# Patient Record
Sex: Male | Born: 1945 | Race: Black or African American | Hispanic: No | Marital: Married | Smoking: Never smoker
Health system: Southern US, Community
[De-identification: ages and names within clinical notes are randomized; demographics above are authoritative.]

## PROBLEM LIST (undated history)

## (undated) DIAGNOSIS — E119 Type 2 diabetes mellitus without complications: Secondary | ICD-10-CM

## (undated) DIAGNOSIS — I10 Essential (primary) hypertension: Secondary | ICD-10-CM

## (undated) DIAGNOSIS — C801 Malignant (primary) neoplasm, unspecified: Secondary | ICD-10-CM

---

## 2001-12-23 ENCOUNTER — Encounter: Payer: Self-pay | Admitting: Cardiology

## 2001-12-23 ENCOUNTER — Ambulatory Visit (HOSPITAL_COMMUNITY): Admission: RE | Admit: 2001-12-23 | Discharge: 2001-12-23 | Payer: Self-pay | Admitting: Cardiology

## 2002-01-26 ENCOUNTER — Ambulatory Visit (HOSPITAL_COMMUNITY): Admission: RE | Admit: 2002-01-26 | Discharge: 2002-01-26 | Payer: Self-pay | Admitting: General Surgery

## 2002-07-16 ENCOUNTER — Encounter: Payer: Self-pay | Admitting: Plastic Surgery

## 2002-07-20 ENCOUNTER — Inpatient Hospital Stay (HOSPITAL_COMMUNITY): Admission: RE | Admit: 2002-07-20 | Discharge: 2002-07-24 | Payer: Self-pay | Admitting: Plastic Surgery

## 2002-07-23 ENCOUNTER — Encounter: Payer: Self-pay | Admitting: Plastic Surgery

## 2012-01-16 ENCOUNTER — Other Ambulatory Visit: Payer: Self-pay

## 2012-01-16 ENCOUNTER — Inpatient Hospital Stay (HOSPITAL_COMMUNITY)
Admission: EM | Admit: 2012-01-16 | Discharge: 2012-01-26 | DRG: 246 | Disposition: A | Discharge status: Home / Self Care | Payer: 59 | Attending: Cardiology | Admitting: Cardiology

## 2012-01-16 ENCOUNTER — Encounter (HOSPITAL_COMMUNITY): Payer: Self-pay | Admitting: Emergency Medicine

## 2012-01-16 ENCOUNTER — Emergency Department (HOSPITAL_COMMUNITY): Payer: 59

## 2012-01-16 DIAGNOSIS — R7989 Other specified abnormal findings of blood chemistry: Secondary | ICD-10-CM | POA: Diagnosis not present

## 2012-01-16 DIAGNOSIS — J449 Chronic obstructive pulmonary disease, unspecified: Secondary | ICD-10-CM | POA: Diagnosis present

## 2012-01-16 DIAGNOSIS — J189 Pneumonia, unspecified organism: Secondary | ICD-10-CM

## 2012-01-16 DIAGNOSIS — Z79899 Other long term (current) drug therapy: Secondary | ICD-10-CM

## 2012-01-16 DIAGNOSIS — F1010 Alcohol abuse, uncomplicated: Secondary | ICD-10-CM | POA: Diagnosis present

## 2012-01-16 DIAGNOSIS — I252 Old myocardial infarction: Secondary | ICD-10-CM

## 2012-01-16 DIAGNOSIS — I2510 Atherosclerotic heart disease of native coronary artery without angina pectoris: Secondary | ICD-10-CM | POA: Diagnosis present

## 2012-01-16 DIAGNOSIS — I471 Supraventricular tachycardia, unspecified: Principal | ICD-10-CM | POA: Diagnosis present

## 2012-01-16 DIAGNOSIS — I129 Hypertensive chronic kidney disease with stage 1 through stage 4 chronic kidney disease, or unspecified chronic kidney disease: Secondary | ICD-10-CM | POA: Diagnosis present

## 2012-01-16 DIAGNOSIS — E8779 Other fluid overload: Secondary | ICD-10-CM | POA: Diagnosis not present

## 2012-01-16 DIAGNOSIS — I509 Heart failure, unspecified: Secondary | ICD-10-CM | POA: Diagnosis present

## 2012-01-16 DIAGNOSIS — I5023 Acute on chronic systolic (congestive) heart failure: Secondary | ICD-10-CM | POA: Diagnosis present

## 2012-01-16 DIAGNOSIS — F17200 Nicotine dependence, unspecified, uncomplicated: Secondary | ICD-10-CM | POA: Diagnosis present

## 2012-01-16 DIAGNOSIS — J9600 Acute respiratory failure, unspecified whether with hypoxia or hypercapnia: Secondary | ICD-10-CM | POA: Diagnosis not present

## 2012-01-16 DIAGNOSIS — I2109 ST elevation (STEMI) myocardial infarction involving other coronary artery of anterior wall: Secondary | ICD-10-CM | POA: Diagnosis present

## 2012-01-16 DIAGNOSIS — I2589 Other forms of chronic ischemic heart disease: Secondary | ICD-10-CM | POA: Diagnosis present

## 2012-01-16 DIAGNOSIS — J690 Pneumonitis due to inhalation of food and vomit: Secondary | ICD-10-CM | POA: Diagnosis not present

## 2012-01-16 DIAGNOSIS — G9340 Encephalopathy, unspecified: Secondary | ICD-10-CM | POA: Diagnosis not present

## 2012-01-16 DIAGNOSIS — N189 Chronic kidney disease, unspecified: Secondary | ICD-10-CM | POA: Diagnosis present

## 2012-01-16 DIAGNOSIS — J4489 Other specified chronic obstructive pulmonary disease: Secondary | ICD-10-CM | POA: Diagnosis present

## 2012-01-16 DIAGNOSIS — N289 Disorder of kidney and ureter, unspecified: Secondary | ICD-10-CM | POA: Diagnosis present

## 2012-01-16 DIAGNOSIS — J9690 Respiratory failure, unspecified, unspecified whether with hypoxia or hypercapnia: Secondary | ICD-10-CM

## 2012-01-16 DIAGNOSIS — Z7982 Long term (current) use of aspirin: Secondary | ICD-10-CM

## 2012-01-16 LAB — CBC
HCT: 34.6 % — ABNORMAL LOW (ref 39.0–52.0)
MCH: 30.4 pg (ref 26.0–34.0)
MCV: 89.9 fL (ref 78.0–100.0)
Platelets: 253 10*3/uL (ref 150–400)
RDW: 15.2 % (ref 11.5–15.5)
WBC: 8.8 10*3/uL (ref 4.0–10.5)

## 2012-01-16 LAB — POCT I-STAT, CHEM 8
Calcium, Ion: 1.15 mmol/L (ref 1.12–1.32)
Chloride: 109 mEq/L (ref 96–112)
Glucose, Bld: 125 mg/dL — ABNORMAL HIGH (ref 70–99)
HCT: 37 % — ABNORMAL LOW (ref 39.0–52.0)
TCO2: 19 mmol/L (ref 0–100)

## 2012-01-16 MED ORDER — ADENOSINE 6 MG/2ML IV SOLN
INTRAVENOUS | Status: AC
  Administered 2012-01-16: 6 mg via INTRAVENOUS
  Filled 2012-01-16: qty 6

## 2012-01-16 MED ORDER — HEPARIN SOD (PORCINE) IN D5W 100 UNIT/ML IV SOLN
1200.0000 [IU]/h | INTRAVENOUS | Status: DC
  Administered 2012-01-16: 1000 [IU]/h via INTRAVENOUS
  Filled 2012-01-16 (×2): qty 250

## 2012-01-16 MED ORDER — DILTIAZEM HCL 50 MG/10ML IV SOLN
20.0000 mg | Freq: Once | INTRAVENOUS | Status: AC
  Administered 2012-01-16: 20 mg via INTRAVENOUS

## 2012-01-16 MED ORDER — HEPARIN SODIUM (PORCINE) 5000 UNIT/ML IJ SOLN
INTRAMUSCULAR | Status: AC
  Administered 2012-01-16: 3000 [IU] via INTRAVENOUS
  Filled 2012-01-16: qty 1

## 2012-01-16 MED ORDER — DEXTROSE 5 % IV SOLN: 60.0000 mg/h | INTRAVENOUS | Status: DC

## 2012-01-16 MED ORDER — VERAPAMIL HCL 2.5 MG/ML IV SOLN
INTRAVENOUS | Status: AC
  Administered 2012-01-16: 5 mg via INTRAVENOUS
  Filled 2012-01-16: qty 2

## 2012-01-16 MED ORDER — METOPROLOL TARTRATE 1 MG/ML IV SOLN
INTRAVENOUS | Status: AC
  Filled 2012-01-16: qty 5

## 2012-01-16 MED ORDER — AMIODARONE HCL IN DEXTROSE 360-4.14 MG/200ML-% IV SOLN: 0.5000 mg/min | INTRAVENOUS | Status: DC

## 2012-01-16 MED ORDER — HEPARIN BOLUS VIA INFUSION: 4000.0000 [IU] | Freq: Once | INTRAVENOUS | Status: DC

## 2012-01-16 MED ORDER — DEXTROSE 5 % IV SOLN: 150.0000 mg | Freq: Once | INTRAVENOUS | Status: DC

## 2012-01-16 MED ORDER — AMIODARONE HCL IN DEXTROSE 360-4.14 MG/200ML-% IV SOLN: 1.0000 mg/min | INTRAVENOUS | Status: DC

## 2012-01-16 MED ORDER — HEPARIN SODIUM (PORCINE) 5000 UNIT/ML IJ SOLN
INTRAMUSCULAR | Status: AC
  Administered 2012-01-16: 5000 [IU] via INTRAVENOUS
  Filled 2012-01-16: qty 1

## 2012-01-16 MED ORDER — METOPROLOL TARTRATE 1 MG/ML IV SOLN
INTRAVENOUS | Status: AC
  Administered 2012-01-16: 2.5 mg
  Filled 2012-01-16: qty 5

## 2012-01-16 MED ORDER — METOPROLOL TARTRATE 1 MG/ML IV SOLN
5.0000 mg | Freq: Once | INTRAVENOUS | Status: AC
  Administered 2012-01-16: 2.5 mg via INTRAVENOUS

## 2012-01-16 MED ORDER — DIGOXIN 0.25 MG/ML IJ SOLN
INTRAMUSCULAR | Status: AC
  Filled 2012-01-16: qty 2

## 2012-01-16 MED ORDER — AMIODARONE LOAD VIA INFUSION
150.0000 mg | Freq: Once | INTRAVENOUS | Status: DC
  Filled 2012-01-16: qty 83.34

## 2012-01-16 NOTE — ED Notes (Signed)
Per Admitting MD administered 2.6 mg Metoprolol

## 2012-01-16 NOTE — ED Notes (Signed)
Per EDP Cardizem 20 mg over 2 minutes adminstered

## 2012-01-16 NOTE — ED Notes (Addendum)
Pt multiple complaints. Cough, weakness, inability to sleep, anorexia, and nausea. PT states his stomach feels like it is filling up with air.

## 2012-01-16 NOTE — ED Notes (Signed)
Adenocard 6mg  given at 2330

## 2012-01-16 NOTE — ED Provider Notes (Signed)
History     CSN: [id number]  Arrival date & time 01/16/12  1904   First MD Initiated Contact with Patient 01/16/12 2118      Chief Complaint  Patient presents with  . Cough    (Consider location/radiation/quality/duration/timing/severity/associated sxs/prior treatment) HPI Complains of generalized weakness cough and some difficulty breathing for approximately the past 4 days accompanied by general malaise. No treatment prior to coming here nothing makes symptoms better or worse. No other associated symptoms no chest pain. History reviewed. No pertinent past medical history. Past medical history negative No past surgical history on file.  No family history on file.  History  Substance Use Topics  . Smoking status: Never Smoker   . Smokeless tobacco: Not on file  . Alcohol Use: Yes      Review of Systems  Constitutional: Positive for fatigue.  HENT: Negative.   Respiratory: Positive for cough and shortness of breath.   Cardiovascular: Negative.   Gastrointestinal: Negative.   Musculoskeletal: Negative.   Skin: Negative.   Neurological: Negative.   Hematological: Negative.   Psychiatric/Behavioral: Negative.     Allergies  Review of patient's allergies indicates no known allergies.  Home Medications   Current Outpatient Rx  Name Route Sig Dispense Refill  . ASPIRIN EC 325 MG PO TBEC Oral Take 325 mg by mouth daily.      BP 127/94  Pulse 168  Temp(Src) 98.5 F (36.9 C) (Oral)  Resp 26  SpO2 96%  Physical Exam  Nursing note and vitals reviewed. Constitutional: He appears well-developed and well-nourished.  HENT:  Head: Normocephalic and atraumatic.  Eyes: Conjunctivae are normal. Pupils are equal, round, and reactive to light.  Neck: Neck supple. No tracheal deviation present. No thyromegaly present.  Cardiovascular:  No murmur heard.      Tachycardic  Pulmonary/Chest: Effort normal and breath sounds normal.  Abdominal: Soft. Bowel sounds are  normal. He exhibits no distension. There is no tenderness.  Musculoskeletal: Normal range of motion. He exhibits no edema and no tenderness.  Neurological: He is alert. Coordination normal.  Skin: Skin is warm and dry. No rash noted.  Psychiatric: He has a normal mood and affect.    ED Course  Procedures (including critical care time)  Date: 01/16/2012  Rate: 170  Rhythm: supraventricular tachycardia (SVT)  QRS Axis: normal  Intervals: normal  ST/T Wave abnormalities: Inferolateral ischemic change  Conduction Disutrbances:none  Narrative Interpretation:   Old EKG Reviewed: changes noted Tracing from 01/22/2002 normal sinus rhythm 65 beats per minute within normal limits Patient received Cardizem 20 mg IV over 2 minutes rhythm converted to normal sinus rhythm Repeat EKG performed at 21:55 PM after Cardizem bolus    Date: 01/16/2012  Rate: 85  Rhythm: normal sinus rhythm  QRS Axis: normal  Intervals: normal  ST/T Wave abnormalities: Inferolateral ischemic T wave changes  Conduction Disutrbances:none   Narrative Interpretation:   Old EKG Reviewed: changes noted Spoke with Dr.Harwani. Will come to the emergency department to evaluate for admission Diagnosis  Labs Reviewed  CBC   No results found.   No diagnosis found.   No results found for this or any previous visit. Dg Chest Port 1 View  01/16/2012  *RADIOLOGY REPORT*  Clinical Data: Severe shortness of breath.  PORTABLE CHEST - 1 VIEW  Comparison: None.  Findings:  The heart is mildly enlarged.  Moderate pulmonary vascular congestion is evident.  No focal airspace disease is evident.  The visualized soft tissues and bony thorax are  unremarkable.  IMPRESSION: Cardiomegaly with moderate pulmonary vascular congestion.  Early congestive heart failure is not excluded.  Original Report Authenticated By: Jamesetta Orleans. MATTERN, M.D.    MDM  Plan cardiacmonitor, r/o acs Diagnosis #1 SVT #2 ischemic EKG changes   CRITICAL  CARE Performed by: Doug Sou   Total critical care time: 30 minute  Critical care time was exclusive of separately billable procedures and treating other patients.  Critical care was necessary to treat or prevent imminent or life-threatening deterioration.  Critical care was time spent personally by me on the following activities: development of treatment plan with patient and/or surrogate as well as nursing, discussions with consultants, evaluation of patient's response to treatment, examination of patient, obtaining history from patient or surrogate, ordering and performing treatments and interventions, ordering and review of laboratory studies, ordering and review of radiographic studies, pulse oximetry and re-evaluation of patient's condition.     Doug Sou, MD 01/16/12 2221

## 2012-01-16 NOTE — Progress Notes (Signed)
ANTICOAGULATION CO NSULT NOTE - Initial Consult  Pharmacy Consult for heparin Indication:  Atrial Tachycardia with ECK changes   No Known Allergies  Patient Measurements: Height: 6' (182.9 cm) Weight: 200 lb (90.719 kg) IBW/kg (Calculated) : 77.6  Heparin Dosing Weight: 82kg   Vital Signs: Temp: 98.5 F (36.9 C) (02/13 1916) Temp src: Oral (02/13 1916) BP: 99/76 mmHg (02/13 2151) Pulse Rate: 92  (02/13 2151)  Labs:  Basename 01/16/12 2230 01/16/12 2159  HGB 12.6* 11.7*  HCT 37.0* 34.6*  PLT -- 253  APTT -- --  LABPROT -- --  INR -- --  HEPARINUNFRC -- --  CREATININE 1.40* --  CKTOTAL -- --  CKMB -- --  TROPONINI -- --   Estimated Creatinine Clearance: 58.5 ml/min (by C-G formula based on Cr of 1.4).    Assessment: Admitted with atrial tachycardia, SOB and ECG changes.  Has been given diltiazem bolus, metoprolol 5mg  iv with litle benefit and ordered amiodarone.  MD wants to start heaprin d/t ECG changes.  Goal of Therapy:  Heparin level 0.3-0.7 units/ml    Plan:  Possible ACS will bolus heparin 4000 uts IV x1 Then heparin drip 1000 uts/hr  Check HL, CBC 6hr after drip started and daily  Marcelino Scot 01/16/2012,10:47 PM

## 2012-01-16 NOTE — H&P (Signed)
Norman Rodgers is an 66 y.o. male.   Chief Complaint: Generalized weakness and shortness of breath HPI: Patient is 66 year old male with no significant past medical history except for tobacco and alcohol abuse he came to the ER complaining of generalized malaise weakness and shortness of breath for last 4 days associated with palpitations. Patient denies any chest pain nausea or vomiting or diaphoresis. Denies history of PND orthopnea or leg swelling. States he drinks approximately one point of hard liquor for last 12 years. Denies any cardiac workup in the recent past. Denies any cough fever chills or flulike symptoms. In ER patient was noted to be in supraventricular tachycardia with heart rate of 170 patient received initially 20 mg of IV Cardizem with conversion to normal sinus rhythm. Her EKG repeat EKG showed the anteroseptal wall myocardial infarction age undetermined and ST T-wave changes in inferolateral leads which were new as compared to prior EKG. Patient had multiple episodes of recurrent paroxysmal SVT requiring initially carotid massage with conversion to sinus rhythm and then IV Lopressor/IV adenocard/IV verapamil with the conversion back into sinus rhythm. Patient denies any chest pain but continues to complain of shortness of breath and was noted in mild congestive heart failure. Presently his labs are pending except as troponin I is minimally elevated his D. dimer start still pending patient did receive a total of 8000 off IV heparin. And will be started on IV heparin drip. We will get spiral CT of chest is D. dimers are elevated to rule out pulmonary embolism.  History reviewed. No pertinent past medical history.  No past surgical history on file.  No family history on file. Social History:  reports that he has never smoked. He does not have any smokeless tobacco history on file. He reports that he drinks alcohol. He reports that he does not use illicit drugs.  Allergies: No Known  Allergies  Medications Prior to Admission  Medication Dose Route Frequency Provider Last Rate Last Dose  . adenosine (ADENOCARD) 6 MG/2ML injection           . amiodarone (NEXTERONE PREMIX) 360 mg/200 mL dextrose IV infusion  1 mg/min Intravenous Continuous Norman Pane, MD       Followed by  . amiodarone (NEXTERONE PREMIX) 360 mg/200 mL dextrose IV infusion  0.5 mg/min Intravenous Continuous Norman Pane, MD      . amiodarone (NEXTERONE) 1.8 mg/mL load via infusion 150 mg  150 mg Intravenous Once Norman Pane, MD      . digoxin (LANOXIN) 0.25 MG/ML injection           . diltiazem (CARDIZEM) injection SOLN 20 mg  20 mg Intravenous Once Norman Sou, MD   20 mg at 01/16/12 2147  . heparin 5000 UNIT/ML injection           . heparin 5000 UNIT/ML injection           . heparin ADULT infusion 100 units/ml (25000 units/250 ml)  1,000 Units/hr Intravenous Continuous Norman Sou, MD      . heparin bolus via infusion 4,000 Units  4,000 Units Intravenous Once Norman Sou, MD      . metoprolol (LOPRESSOR) 1 MG/ML injection        2.5 mg at 01/16/12 2253  . metoprolol (LOPRESSOR) injection 5 mg  5 mg Intravenous Once Norman Pane, MD   2.5 mg at 01/16/12 2241  . metoprolol (LOPRESSOR) injection 5 mg  5 mg Intravenous Once Norman Pane, MD  2.5 mg at 01/16/12 2250  . verapamil (ISOPTIN) 2.5 MG/ML injection           . DISCONTD: amiodarone (CORDARONE) 450 mg in dextrose 5 % 250 mL infusion  150 mg Intravenous Once Norman Pane, MD      . DISCONTD: amiodarone (CORDARONE) 450 mg in dextrose 5 % 250 mL infusion  60 mg/hr Intravenous Continuous Norman Pane, MD       No current outpatient prescriptions on file as of 01/16/2012.    Results for orders placed during the hospital encounter of 01/16/12 (from the past 48 hour(s))  CBC     Status: Abnormal   Collection Time   01/16/12  9:59 PM      Component Value Range Comment   WBC 8.8  4.0 - 10.5 (K/uL)    RBC 3.85 (*) 4.22 - 5.81  (MIL/uL)    Hemoglobin 11.7 (*) 13.0 - 17.0 (g/dL)    HCT 16.1 (*) 09.6 - 52.0 (%)    MCV 89.9  78.0 - 100.0 (fL)    MCH 30.4  26.0 - 34.0 (pg)    MCHC 33.8  30.0 - 36.0 (g/dL)    RDW 04.5  40.9 - 81.1 (%)    Platelets 253  150 - 400 (K/uL)   POCT I-STAT TROPONIN I     Status: Abnormal   Collection Time   01/16/12 10:27 PM      Component Value Range Comment   Troponin i, poc 0.15 (*) 0.00 - 0.08 (ng/mL)    Comment NOTIFIED PHYSICIAN      Comment 3            POCT I-STAT, CHEM 8     Status: Abnormal   Collection Time   01/16/12 10:30 PM      Component Value Range Comment   Sodium 142  135 - 145 (mEq/L)    Potassium 4.7  3.5 - 5.1 (mEq/L)    Chloride 109  96 - 112 (mEq/L)    BUN 29 (*) 6 - 23 (mg/dL)    Creatinine, Ser 9.14 (*) 0.50 - 1.35 (mg/dL)    Glucose, Bld 782 (*) 70 - 99 (mg/dL)    Calcium, Ion 9.56  1.12 - 1.32 (mmol/L)    TCO2 19  0 - 100 (mmol/L)    Hemoglobin 12.6 (*) 13.0 - 17.0 (g/dL)    HCT 21.3 (*) 08.6 - 52.0 (%)    Dg Chest Port 1 View  01/16/2012  *RADIOLOGY REPORT*  Clinical Data: Severe shortness of breath.  PORTABLE CHEST - 1 VIEW  Comparison: None.  Findings:  The heart is mildly enlarged.  Moderate pulmonary vascular congestion is evident.  No focal airspace disease is evident.  The visualized soft tissues and bony thorax are unremarkable.  IMPRESSION: Cardiomegaly with moderate pulmonary vascular congestion.  Early congestive heart failure is not excluded.  Original Report Authenticated By: Norman Rodgers, M.D.    Review of Systems  Constitutional: Negative for fever and chills.  HENT: Negative for neck pain.   Eyes: Negative for blurred vision and double vision.  Respiratory: Positive for shortness of breath. Negative for cough and wheezing.   Cardiovascular: Negative for chest pain, palpitations and orthopnea.  Gastrointestinal: Negative for nausea, vomiting and abdominal pain.  Musculoskeletal: Negative for myalgias.  Skin: Negative for rash.    Neurological: Negative for headaches.    Blood pressure 102/68, pulse 159, temperature 98.5 F (36.9 C), temperature source Oral, resp. rate 26, height 6' (  1.829 m), weight 90.719 kg (200 lb), SpO2 96.00%. Physical Exam  Constitutional: He is oriented to person, place, and time. He appears well-developed and well-nourished.  HENT:  Head: Normocephalic.  Eyes: Conjunctivae are normal. Left eye exhibits no discharge. No scleral icterus.  Neck: Normal range of motion. Neck supple. JVD present. No tracheal deviation present. No thyromegaly present.  Cardiovascular: Normal rate and regular rhythm.        S1-S2 normal there was soft systolic murmur and S3 gallop  Respiratory:       Decreased breath sound at bases with bibasilar Rales.  GI: Soft. Bowel sounds are normal. He exhibits no distension and no mass. There is no tenderness. There is no rebound and no guarding.  Musculoskeletal: He exhibits no edema and no tenderness.  Lymphadenopathy:    He has no cervical adenopathy.  Neurological: He is alert and oriented to person, place, and time.     Assessment/Plan Recurrent paroxysmal supraventricular tachycardia rule out pulmonary embolism rule out MI Probable recent anteroseptal wall myocardial infarction Mild decompensated congestive heart failure multi-factorial i.e. tachycardia induced/probable recent MI  EtOH abuse History of tobacco abuse Plan As per orders   Norman Rodgers 01/16/2012, 11:49 PM

## 2012-01-16 NOTE — ED Notes (Signed)
C/o dry cough, difficulty breathing, decreased appetite, chills, and nausea x 6 days.

## 2012-01-16 NOTE — ED Notes (Signed)
EKG completed at 2103 and given to Dr. Ethelda Chick along with OLD ekg.

## 2012-01-17 ENCOUNTER — Emergency Department (HOSPITAL_COMMUNITY): Payer: 59

## 2012-01-17 ENCOUNTER — Other Ambulatory Visit: Payer: Self-pay

## 2012-01-17 ENCOUNTER — Encounter (HOSPITAL_COMMUNITY): Payer: Self-pay | Admitting: Anesthesiology

## 2012-01-17 ENCOUNTER — Inpatient Hospital Stay (HOSPITAL_COMMUNITY): Payer: 59 | Admitting: Certified Registered Nurse Anesthetist

## 2012-01-17 ENCOUNTER — Encounter (HOSPITAL_COMMUNITY)
Admission: EM | Disposition: A | Discharge status: Home / Self Care | Payer: Self-pay | Source: Home / Self Care | Attending: Cardiology

## 2012-01-17 ENCOUNTER — Encounter (HOSPITAL_COMMUNITY): Payer: Self-pay | Admitting: Radiology

## 2012-01-17 ENCOUNTER — Inpatient Hospital Stay (HOSPITAL_COMMUNITY): Payer: 59

## 2012-01-17 ENCOUNTER — Encounter (HOSPITAL_COMMUNITY): Payer: Self-pay | Admitting: Certified Registered Nurse Anesthetist

## 2012-01-17 DIAGNOSIS — F1010 Alcohol abuse, uncomplicated: Secondary | ICD-10-CM | POA: Diagnosis present

## 2012-01-17 DIAGNOSIS — J189 Pneumonia, unspecified organism: Secondary | ICD-10-CM

## 2012-01-17 DIAGNOSIS — I471 Supraventricular tachycardia: Secondary | ICD-10-CM

## 2012-01-17 DIAGNOSIS — I2510 Atherosclerotic heart disease of native coronary artery without angina pectoris: Secondary | ICD-10-CM

## 2012-01-17 DIAGNOSIS — J9600 Acute respiratory failure, unspecified whether with hypoxia or hypercapnia: Secondary | ICD-10-CM

## 2012-01-17 DIAGNOSIS — J9690 Respiratory failure, unspecified, unspecified whether with hypoxia or hypercapnia: Secondary | ICD-10-CM

## 2012-01-17 DIAGNOSIS — I509 Heart failure, unspecified: Secondary | ICD-10-CM

## 2012-01-17 HISTORY — PX: SUPRAVENTRICULAR TACHYCARDIA ABLATION: SHX5492

## 2012-01-17 LAB — CBC
HCT: 34.2 % — ABNORMAL LOW (ref 39.0–52.0)
HCT: 36.4 % — ABNORMAL LOW (ref 39.0–52.0)
Hemoglobin: 12 g/dL — ABNORMAL LOW (ref 13.0–17.0)
MCH: 30.5 pg (ref 26.0–34.0)
MCHC: 33.3 g/dL (ref 30.0–36.0)
MCHC: 33 g/dL (ref 30.0–36.0)
Platelets: 247 10*3/uL (ref 150–400)
RDW: 15.3 % (ref 11.5–15.5)
RDW: 15.6 % — ABNORMAL HIGH (ref 11.5–15.5)

## 2012-01-17 LAB — LIPID PANEL
Cholesterol: 134 mg/dL (ref 0–200)
HDL: 24 mg/dL — ABNORMAL LOW (ref >39)
Total CHOL/HDL Ratio: 5.6 RATIO
Triglycerides: 68 mg/dL (ref <150)
VLDL: 14 mg/dL (ref 0–40)

## 2012-01-17 LAB — BLOOD GAS, ARTERIAL
Bicarbonate: 19.5 mEq/L — ABNORMAL LOW (ref 20.0–24.0)
MECHVT: 570 mL
PEEP: 5 cmH2O
Patient temperature: 98.6
TCO2: 20.7 mmol/L (ref 0–100)
pCO2 arterial: 38.7 mmHg (ref 35.0–45.0)
pH, Arterial: 7.323 — ABNORMAL LOW (ref 7.350–7.450)

## 2012-01-17 LAB — BASIC METABOLIC PANEL
CO2: 14 mEq/L — ABNORMAL LOW (ref 19–32)
Chloride: 106 mEq/L (ref 96–112)
Potassium: 5.4 mEq/L — ABNORMAL HIGH (ref 3.5–5.1)
Sodium: 141 mEq/L (ref 135–145)

## 2012-01-17 LAB — COMPREHENSIVE METABOLIC PANEL
Albumin: 2.9 g/dL — ABNORMAL LOW (ref 3.5–5.2)
BUN: 29 mg/dL — ABNORMAL HIGH (ref 6–23)
Creatinine, Ser: 1.56 mg/dL — ABNORMAL HIGH (ref 0.50–1.35)
Potassium: 5.2 mEq/L — ABNORMAL HIGH (ref 3.5–5.1)
Total Protein: 7.7 g/dL (ref 6.0–8.3)

## 2012-01-17 LAB — D-DIMER, QUANTITATIVE
D-Dimer, Quant: 1.87 ug/mL-FEU — ABNORMAL HIGH (ref 0.00–0.48)
D-Dimer, Quant: 2.25 ug/mL-FEU — ABNORMAL HIGH (ref 0.00–0.48)

## 2012-01-17 LAB — DIFFERENTIAL
Basophils Absolute: 0 10*3/uL (ref 0.0–0.1)
Basophils Relative: 0 % (ref 0–1)
Eosinophils Relative: 0 % (ref 0–5)
Monocytes Absolute: 0.6 10*3/uL (ref 0.1–1.0)
Neutro Abs: 6.8 10*3/uL (ref 1.7–7.7)

## 2012-01-17 LAB — CARDIAC PANEL(CRET KIN+CKTOT+MB+TROPI)
CK, MB: 6.3 ng/mL (ref 0.3–4.0)
Relative Index: 2.1 (ref 0.0–2.5)
Relative Index: 2.6 — ABNORMAL HIGH (ref 0.0–2.5)
Relative Index: 3.1 — ABNORMAL HIGH (ref 0.0–2.5)
Total CK: 302 U/L — ABNORMAL HIGH (ref 7–232)
Total CK: 309 U/L — ABNORMAL HIGH (ref 7–232)
Troponin I: <0.3 ng/mL (ref <0.30)
Troponin I: <0.3 ng/mL (ref <0.30)

## 2012-01-17 LAB — MRSA PCR SCREENING: MRSA by PCR: NEGATIVE

## 2012-01-17 LAB — APTT: aPTT: 88 seconds — ABNORMAL HIGH (ref 24–37)

## 2012-01-17 LAB — HEPARIN LEVEL (UNFRACTIONATED): Heparin Unfractionated: 0.12 IU/mL — ABNORMAL LOW (ref 0.30–0.70)

## 2012-01-17 LAB — MAGNESIUM: Magnesium: 2.3 mg/dL (ref 1.5–2.5)

## 2012-01-17 LAB — TSH: TSH: 0.532 u[IU]/mL (ref 0.350–4.500)

## 2012-01-17 LAB — PROTIME-INR: Prothrombin Time: 20.6 seconds — ABNORMAL HIGH (ref 11.6–15.2)

## 2012-01-17 SURGERY — SUPRAVENTRICULAR TACHYCARDIA ABLATION
Anesthesia: General

## 2012-01-17 SURGERY — LEFT HEART CATHETERIZATION WITH CORONARY ANGIOGRAM
Anesthesia: LOCAL

## 2012-01-17 MED ORDER — ETOMIDATE 2 MG/ML IV SOLN
INTRAVENOUS | Status: DC | PRN
  Administered 2012-01-17: 8 mg via INTRAVENOUS

## 2012-01-17 MED ORDER — SODIUM CHLORIDE 0.9 % IV SOLN
20.0000 ug/h | INTRAVENOUS | Status: DC
  Filled 2012-01-17: qty 50

## 2012-01-17 MED ORDER — THIAMINE HCL 100 MG/ML IJ SOLN
100.0000 mg | Freq: Every day | INTRAMUSCULAR | Status: DC
  Administered 2012-01-17 – 2012-01-23 (×7): 100 mg via INTRAVENOUS
  Filled 2012-01-17 (×7): qty 1

## 2012-01-17 MED ORDER — AMIODARONE HCL IN DEXTROSE 360-4.14 MG/200ML-% IV SOLN: 30.0000 mg/h | INTRAVENOUS | Status: DC

## 2012-01-17 MED ORDER — DIGOXIN 0.25 MG/ML IJ SOLN
0.2500 mg | Freq: Once | INTRAMUSCULAR | Status: AC
  Administered 2012-01-17: 0.25 mg via INTRAVENOUS
  Filled 2012-01-17: qty 1

## 2012-01-17 MED ORDER — ONDANSETRON HCL 4 MG/2ML IJ SOLN: 4.0000 mg | Freq: Four times a day (QID) | INTRAMUSCULAR | Status: DC | PRN

## 2012-01-17 MED ORDER — SODIUM CHLORIDE 0.9 % IJ SOLN
3.0000 mL | Freq: Two times a day (BID) | INTRAMUSCULAR | Status: DC
  Administered 2012-01-17 (×2): 3 mL via INTRAVENOUS

## 2012-01-17 MED ORDER — SODIUM CHLORIDE 0.9 % IV SOLN
INTRAVENOUS | Status: DC
  Administered 2012-01-17 – 2012-01-20 (×3): via INTRAVENOUS

## 2012-01-17 MED ORDER — SODIUM CHLORIDE 0.9 % IV SOLN
INTRAVENOUS | Status: DC
  Administered 2012-01-17: 02:00:00 via INTRAVENOUS

## 2012-01-17 MED ORDER — SODIUM CHLORIDE 0.9 % IV SOLN: 250.0000 mL | INTRAVENOUS | Status: DC | PRN

## 2012-01-17 MED ORDER — AMIODARONE HCL IN DEXTROSE 360-4.14 MG/200ML-% IV SOLN
0.5000 mg/min | INTRAVENOUS | Status: DC
  Filled 2012-01-17: qty 200

## 2012-01-17 MED ORDER — DIAZEPAM 5 MG PO TABS: 5.0000 mg | ORAL_TABLET | ORAL | Status: DC

## 2012-01-17 MED ORDER — FENTANYL CITRATE 0.05 MG/ML IJ SOLN
INTRAMUSCULAR | Status: AC
  Filled 2012-01-17: qty 2

## 2012-01-17 MED ORDER — FUROSEMIDE 10 MG/ML IJ SOLN
20.0000 mg | Freq: Once | INTRAMUSCULAR | Status: AC
  Administered 2012-01-17: 20 mg via INTRAVENOUS
  Filled 2012-01-17: qty 2

## 2012-01-17 MED ORDER — ROSUVASTATIN CALCIUM 20 MG PO TABS
20.0000 mg | ORAL_TABLET | Freq: Every day | ORAL | Status: DC
  Administered 2012-01-17: 20 mg via ORAL
  Filled 2012-01-17: qty 1

## 2012-01-17 MED ORDER — ASPIRIN 81 MG PO CHEW
324.0000 mg | CHEWABLE_TABLET | ORAL | Status: AC
  Administered 2012-01-17: 324 mg via ORAL
  Filled 2012-01-17: qty 4

## 2012-01-17 MED ORDER — FENTANYL CITRATE 0.05 MG/ML IJ SOLN
INTRAMUSCULAR | Status: AC
  Administered 2012-01-17: 100 ug
  Filled 2012-01-17: qty 2

## 2012-01-17 MED ORDER — AMIODARONE HCL IN DEXTROSE 360-4.14 MG/200ML-% IV SOLN
30.0000 mg/h | INTRAVENOUS | Status: DC
  Administered 2012-01-17: 60 mg/h via INTRAVENOUS
  Administered 2012-01-18: 30 mg/h via INTRAVENOUS
  Administered 2012-01-18 (×2): 60 mg/h via INTRAVENOUS
  Administered 2012-01-18 – 2012-01-20 (×4): 30 mg/h via INTRAVENOUS
  Filled 2012-01-17 (×18): qty 200

## 2012-01-17 MED ORDER — MIDAZOLAM HCL 5 MG/5ML IJ SOLN
INTRAMUSCULAR | Status: AC
  Filled 2012-01-17: qty 5

## 2012-01-17 MED ORDER — SODIUM CHLORIDE 0.9 % IV SOLN
INTRAVENOUS | Status: DC
  Administered 2012-01-17 – 2012-01-19 (×2): via INTRAVENOUS

## 2012-01-17 MED ORDER — AMIODARONE HCL IN DEXTROSE 360-4.14 MG/200ML-% IV SOLN: 0.5000 mg/min | INTRAVENOUS | Status: DC

## 2012-01-17 MED ORDER — PANTOPRAZOLE SODIUM 40 MG IV SOLR
40.0000 mg | Freq: Every day | INTRAVENOUS | Status: DC
  Administered 2012-01-18 – 2012-01-23 (×6): 40 mg via INTRAVENOUS
  Filled 2012-01-17 (×6): qty 40

## 2012-01-17 MED ORDER — PANTOPRAZOLE SODIUM 40 MG PO TBEC
40.0000 mg | DELAYED_RELEASE_TABLET | Freq: Every day | ORAL | Status: DC
  Administered 2012-01-17: 40 mg via ORAL
  Filled 2012-01-17: qty 1

## 2012-01-17 MED ORDER — DEXTROSE 5 % IV SOLN
500.0000 mg | INTRAVENOUS | Status: AC
  Administered 2012-01-17 – 2012-01-22 (×6): 500 mg via INTRAVENOUS
  Filled 2012-01-17 (×8): qty 500

## 2012-01-17 MED ORDER — BUPIVACAINE HCL (PF) 0.25 % IJ SOLN
INTRAMUSCULAR | Status: AC
  Filled 2012-01-17: qty 30

## 2012-01-17 MED ORDER — CHLORHEXIDINE GLUCONATE 0.12 % MT SOLN
15.0000 mL | Freq: Two times a day (BID) | OROMUCOSAL | Status: DC
  Administered 2012-01-18 – 2012-01-19 (×3): 15 mL via OROMUCOSAL
  Filled 2012-01-17 (×3): qty 15

## 2012-01-17 MED ORDER — ASPIRIN 300 MG RE SUPP: 300.0000 mg | RECTAL | Status: AC

## 2012-01-17 MED ORDER — SODIUM CHLORIDE 0.9 % IV SOLN
INTRAVENOUS | Status: DC | PRN
  Administered 2012-01-17: 19:00:00 via INTRAVENOUS

## 2012-01-17 MED ORDER — SODIUM CHLORIDE 0.9 % IV SOLN
2.0000 mg/h | INTRAVENOUS | Status: DC
  Filled 2012-01-17: qty 10

## 2012-01-17 MED ORDER — DIGOXIN 0.25 MG/ML IJ SOLN: 0.2500 mg | Freq: Once | INTRAMUSCULAR | Status: DC

## 2012-01-17 MED ORDER — METOPROLOL TARTRATE 25 MG PO TABS
25.0000 mg | ORAL_TABLET | Freq: Two times a day (BID) | ORAL | Status: DC
  Administered 2012-01-17 – 2012-01-18 (×4): 25 mg via ORAL
  Filled 2012-01-17 (×6): qty 1

## 2012-01-17 MED ORDER — AMIODARONE LOAD VIA INFUSION
150.0000 mg | Freq: Once | INTRAVENOUS | Status: DC
  Administered 2012-01-17: 150 mg via INTRAVENOUS
  Filled 2012-01-17: qty 83.34

## 2012-01-17 MED ORDER — ACETAMINOPHEN 325 MG PO TABS
650.0000 mg | ORAL_TABLET | ORAL | Status: DC | PRN
  Filled 2012-01-17: qty 1

## 2012-01-17 MED ORDER — DIGOXIN 0.25 MG/ML IJ SOLN
0.2500 mg | Freq: Every day | INTRAMUSCULAR | Status: DC
  Administered 2012-01-17: 0.25 mg via INTRAVENOUS
  Filled 2012-01-17 (×2): qty 1

## 2012-01-17 MED ORDER — SODIUM BICARBONATE 8.4 % IV SOLN
INTRAVENOUS | Status: DC
  Filled 2012-01-17: qty 1000

## 2012-01-17 MED ORDER — AMIODARONE HCL IN DEXTROSE 360-4.14 MG/200ML-% IV SOLN
INTRAVENOUS | Status: AC
  Administered 2012-01-17: 1 mg/min via INTRAVENOUS
  Filled 2012-01-17: qty 200

## 2012-01-17 MED ORDER — ADENOSINE 6 MG/2ML IV SOLN
INTRAVENOUS | Status: AC
  Administered 2012-01-17: 12 mg via INTRAVENOUS
  Filled 2012-01-17: qty 4

## 2012-01-17 MED ORDER — NITROGLYCERIN 0.4 MG SL SUBL: 0.4000 mg | SUBLINGUAL_TABLET | SUBLINGUAL | Status: DC | PRN

## 2012-01-17 MED ORDER — CHLORHEXIDINE GLUCONATE 0.12 % MT SOLN
OROMUCOSAL | Status: AC
  Administered 2012-01-17: 15 mL
  Filled 2012-01-17: qty 15

## 2012-01-17 MED ORDER — ACETAMINOPHEN 325 MG PO TABS: 650.0000 mg | ORAL_TABLET | ORAL | Status: DC | PRN

## 2012-01-17 MED ORDER — FUROSEMIDE 10 MG/ML IJ SOLN
80.0000 mg | Freq: Four times a day (QID) | INTRAMUSCULAR | Status: AC
  Administered 2012-01-18 (×2): 80 mg via INTRAVENOUS
  Filled 2012-01-17 (×3): qty 8

## 2012-01-17 MED ORDER — SODIUM CHLORIDE 0.9 % IV SOLN
INTRAVENOUS | Status: DC
  Administered 2012-01-18 – 2012-01-19 (×2): via INTRAVENOUS

## 2012-01-17 MED ORDER — ADENOSINE 12 MG/4ML IV SOLN
12.0000 mg | Freq: Once | INTRAVENOUS | Status: AC
  Administered 2012-01-17: 12 mg via INTRAVENOUS
  Filled 2012-01-17: qty 4

## 2012-01-17 MED ORDER — FUROSEMIDE 10 MG/ML IJ SOLN
80.0000 mg | Freq: Once | INTRAMUSCULAR | Status: AC
  Administered 2012-01-17: 80 mg via INTRAVENOUS

## 2012-01-17 MED ORDER — IOHEXOL 350 MG/ML SOLN
100.0000 mL | Freq: Once | INTRAVENOUS | Status: AC | PRN
  Administered 2012-01-17: 100 mL via INTRAVENOUS

## 2012-01-17 MED ORDER — MIDAZOLAM HCL 2 MG/2ML IJ SOLN
4.0000 mg | Freq: Once | INTRAMUSCULAR | Status: AC
  Administered 2012-01-17: 4 mg via INTRAVENOUS

## 2012-01-17 MED ORDER — DEXTROSE 5 % IV SOLN
1.0000 g | INTRAVENOUS | Status: DC
  Administered 2012-01-17 – 2012-01-25 (×9): 1 g via INTRAVENOUS
  Filled 2012-01-17 (×11): qty 10

## 2012-01-17 MED ORDER — VANCOMYCIN HCL 1000 MG IV SOLR
2000.0000 mg | Freq: Once | INTRAVENOUS | Status: DC
  Administered 2012-01-17: 2000 mg via INTRAVENOUS
  Filled 2012-01-17: qty 2000

## 2012-01-17 MED ORDER — ASPIRIN 81 MG PO CHEW
324.0000 mg | CHEWABLE_TABLET | ORAL | Status: AC
  Administered 2012-01-17: 324 mg via ORAL
  Filled 2012-01-17: qty 3
  Filled 2012-01-17: qty 1

## 2012-01-17 MED ORDER — DEXTROSE 5 % IV SOLN: 1.0000 mg/min | INTRAVENOUS | Status: DC

## 2012-01-17 MED ORDER — MIDAZOLAM HCL 2 MG/2ML IJ SOLN
2.0000 mg | INTRAMUSCULAR | Status: DC | PRN
  Administered 2012-01-17: 2 mg via INTRAVENOUS
  Filled 2012-01-17: qty 2

## 2012-01-17 MED ORDER — MIDAZOLAM HCL 2 MG/2ML IJ SOLN
INTRAMUSCULAR | Status: AC
  Filled 2012-01-17: qty 2

## 2012-01-17 MED ORDER — FUROSEMIDE 10 MG/ML IJ SOLN
INTRAMUSCULAR | Status: AC
  Filled 2012-01-17: qty 8

## 2012-01-17 MED ORDER — SODIUM CHLORIDE 0.9 % IV SOLN
50.0000 ug/h | INTRAVENOUS | Status: DC
  Administered 2012-01-17: 75 ug/h via INTRAVENOUS
  Filled 2012-01-17 (×2): qty 50

## 2012-01-17 MED ORDER — SODIUM CHLORIDE 0.9 % IJ SOLN: 3.0000 mL | INTRAMUSCULAR | Status: DC | PRN

## 2012-01-17 MED ORDER — ADULT MULTIVITAMIN LIQUID CH
5.0000 mL | Freq: Every day | ORAL | Status: DC
  Administered 2012-01-17 – 2012-01-23 (×7): 5 mL via ORAL
  Filled 2012-01-17 (×7): qty 5

## 2012-01-17 MED ORDER — SODIUM CHLORIDE 0.9 % IV SOLN
1.0000 mg/h | INTRAVENOUS | Status: DC
  Administered 2012-01-17: 2 mg/h via INTRAVENOUS
  Filled 2012-01-17 (×2): qty 10

## 2012-01-17 MED ORDER — SODIUM BICARBONATE 8.4 % IV SOLN
INTRAVENOUS | Status: DC
  Filled 2012-01-17: qty 500

## 2012-01-17 MED ORDER — VERAPAMIL HCL 2.5 MG/ML IV SOLN
2.5000 mg | Freq: Once | INTRAVENOUS | Status: DC
  Filled 2012-01-17: qty 1

## 2012-01-17 MED ORDER — FENTANYL BOLUS VIA INFUSION
50.0000 ug | Freq: Four times a day (QID) | INTRAVENOUS | Status: DC | PRN
  Filled 2012-01-17: qty 100

## 2012-01-17 MED ORDER — ASPIRIN EC 81 MG PO TBEC
81.0000 mg | DELAYED_RELEASE_TABLET | Freq: Every day | ORAL | Status: DC
  Administered 2012-01-18 – 2012-01-21 (×4): 81 mg via ORAL
  Filled 2012-01-17 (×6): qty 1

## 2012-01-17 MED ORDER — SUCCINYLCHOLINE CHLORIDE 20 MG/ML IJ SOLN
INTRAMUSCULAR | Status: DC | PRN
  Administered 2012-01-17 (×2): 100 mg via INTRAVENOUS

## 2012-01-17 MED ORDER — FOLIC ACID 5 MG/ML IJ SOLN
1.0000 mg | Freq: Every day | INTRAMUSCULAR | Status: DC
  Administered 2012-01-17 – 2012-01-23 (×7): 1 mg via INTRAVENOUS
  Filled 2012-01-17 (×9): qty 0.2

## 2012-01-17 MED ORDER — AMIODARONE HCL IN DEXTROSE 360-4.14 MG/200ML-% IV SOLN
1.0000 mg/min | INTRAVENOUS | Status: DC
  Administered 2012-01-17: 1 mg/min via INTRAVENOUS
  Administered 2012-01-17: 0.5 mg/min via INTRAVENOUS
  Filled 2012-01-17: qty 200

## 2012-01-17 MED ORDER — SODIUM BICARBONATE 8.4 % IV SOLN: INTRAVENOUS | Status: DC

## 2012-01-17 NOTE — Progress Notes (Signed)
Dr. Sharyn Lull updated on Pt's condition, shortness of breath and labored breathing.  Dr. Sharyn Lull to come and see pt.

## 2012-01-17 NOTE — Op Note (Signed)
EPS/RFA of atrial tachycardia carried out via the left femoral vein. [id number].

## 2012-01-17 NOTE — Progress Notes (Signed)
Patient remains somnolent after ablation and needs to be maintained still while femoral sheaths are removed. His oxygen saturation is 95%. I have recommended we electively intubate/sedate to protect him from groin bleeding and airway protection. Lewayne Bunting, M.D.

## 2012-01-17 NOTE — Progress Notes (Signed)
CRITICAL VALUE ALERT  Critical value received:  Trop. 0.32   Date of notification:  01/17/12   Time of notification:  0900am  Critical value read back:yes  Nurse who received alert:  Dayna Barker, RN  MD notified (1st page):  Dr. Sharyn Lull  Time of first page:  0955am  MD notified (2nd page):  Time of second page:  Responding MD:  Dr. Sharyn Lull  Time MD responded:  [phone]

## 2012-01-17 NOTE — Progress Notes (Signed)
Pt HR in 150's. Pt having shortness of breath and nausea.  Stat 12 lead ecg obtained. Dr. Sharyn Lull notified, orders received for Amiodarone.  Dr. Ladona Ridgel came to bedside and ordered adenosine 12mg .  This was given with Dr. Ladona Ridgel and Sharyn Lull at bedside.  No change in rhythm with adenosine.  Amiodarone 150mg  bolus and 60mg /hr drip started and received orders from Dr. Sharyn Lull for IV digoxin.  Will continue to monitor.

## 2012-01-17 NOTE — Consult Note (Signed)
Name: FLORENCE ANTONELLI MRN: [id number] DOB: 1946/08/26  LOS: 1  CRITICAL CARE ADMISSION NOTE  History of Present Illness: 66 y/o male with no past medical history presented to [facility] ED on 2/13 with several days of dyspnea and malaise.  He was found to be in SVT and likely volume overload and was admitted to cardiology for further management.  He underwent an RFA today for recurrent SVT atrial tachycardia and was somnolent post procedure so was electively intubated.  PCCM consulted for further management.  Lines / Drains: 2/14 ETT >>  Cultures / Sepsis markers: 2/14 blood >> 2/14 sputum >> 2/14 urine st/leg ag >>  Antibiotics: 2/14 Ceft (pneumonia?) >> 2/14 azithro (pneumonia?) >> 2/14 vanc (pneumonia?) >> 2/14  Tests / Events: 2/14 EP study, ablation for SVT  2/14 TTE: global hypokinesis of LV, LVEF 20% 2/14 CT angio     History reviewed. No pertinent past medical history. History reviewed. No pertinent past surgical history. Prior to Admission medications   Medication Sig Start Date End Date Taking? Authorizing Provider  aspirin EC 325 MG tablet Take 325 mg by mouth daily.   Yes Historical Provider, MD   No Known Allergies History reviewed. No pertinent family history. Social History  reports that he has never smoked. He does not have any smokeless tobacco history on file. He reports that he drinks alcohol. He reports that he does not use illicit drugs.  Review Of Systems   Cannot obtain due to intubation  Vital Signs:   Filed Vitals:   01/17/12 1300 01/17/12 1400 01/17/12 1812 01/17/12 1918  BP: 102/81 97/69  121/86  Pulse: 134 138 158 74  Temp:  97.3 F (36.3 C)    TempSrc:  Oral    Resp: 38 38  27  Height:      Weight:      SpO2: 97% 98%  98%    Physical Examination: Gen: sedated on vent HEENT: NCAT, PERRL, EOMi, OP clear, ETT in place Neck: supple without masses PULM: Insp crackles noted, rhonchi bilat noted CV: RRR, cannot appreciate S3, elevated JVP  noted AB: BS+, soft, nontender, no hsm Ext: warm, no edema, no clubbing, no cyanosis Derm: no rash or skin breakdown Neuro: sedated on vent, follows commands intermittently and maew Psyche: cannot assess  Labs and Imaging:    CBC    Component Value Date/Time   WBC 12.8* 01/17/2012 0759   RBC 3.93* 01/17/2012 0759   HGB 12.0* 01/17/2012 0759   HCT 36.4* 01/17/2012 0759   PLT 247 01/17/2012 0759   MCV 92.6 01/17/2012 0759   MCH 30.5 01/17/2012 0759   MCHC 33.0 01/17/2012 0759   RDW 15.6* 01/17/2012 0759   LYMPHSABS 2.2 01/17/2012 0115   MONOABS 0.6 01/17/2012 0115   EOSABS 0.0 01/17/2012 0115   BASOSABS 0.0 01/17/2012 0115    BMET    Component Value Date/Time   NA 141 01/17/2012 0759   K 5.4* 01/17/2012 0759   CL 106 01/17/2012 0759   CO2 14* 01/17/2012 0759   GLUCOSE 98 01/17/2012 0759   BUN 36* 01/17/2012 0759   CREATININE 1.64* 01/17/2012 0759   CALCIUM 9.5 01/17/2012 0759   GFRNONAA 43* 01/17/2012 0759   GFRAA 49* 01/17/2012 0759     Assessment and Plan:  This is a 66 y/o male with no past medical history but a significant alcohol use history presented on 2/13 with respiratory distress and SVT.  Intubated for respiratory insufficiency after EP study/ablation today.  Appears to  be grossly volume overloaded.  Ddx of resp failure includes pneumonia however his wife reports no sputum production or fever prior to admission and he has not had fever here so I doubt that he has pneumonia.  Respiratory failure (01/17/2012)   Assessment: due to volume overload   Plan:  -I have added two more doses of lasix tonight for continued diuresis -full vent support -ABG now and in AM -CXR in AM -wua/sbt in AM, hopefully will extubate in AM  SVT (supraventricular tachycardia) (01/16/2012)   Assessment: s/p ablation today, related to EtOH abuse?   Plan:  -per cardiology  CHF (congestive heart failure) (01/17/2012)   Assessment: due to alcohol abuse vs. SVT? still volume up 2/14   Plan:  -I will  diurese further -remainder of care/plan per cardiology  CAD (coronary artery disease) (01/17/2012)   Assessment: question of old ant MI   Plan:  -per cardiology  Pneumonia (01/17/2012)   Assessment: I do not think that he has pneumonia as he has no fever or sputum production, I think the infiltrates likely represent pulm edema   Plan:  -I have stopped vanc -continue ceftriaxone and azithro for now -f/u cultures/biomarkers -if afebrile, no secretions, would stop antibiotics on 2/15  Alcohol abuse (01/17/2012)   Assessment:    Plan:  -I have started daily thiamine/folate/mvi supplementation    Best practices / Disposition: PCCM consulting, Dr. Sharyn Lull primary physician  Feeding/protein malnutrition:  Analgesia: fentanyl gtt Sedation: versed prn Thromboprophylaxis: scd HOB >30 degrees Ulcer prophylaxis: ppi  Glucose control/hyperglycemia: monitor cbg  The patient is critically ill with multiple organ systems failure and requires high complexity decision making for assessment and support, frequent evaluation and titration of therapies, application of advanced monitoring technologies and extensive interpretation of multiple databases. Critical Care Time devoted to patient care services described in this note is  45 minutes.  Heber Canute, M.D. Pulmonary and Critical Care Medicine [facility] Pager: [phone]  01/17/2012, 7:44 PM

## 2012-01-17 NOTE — Progress Notes (Signed)
ANTICOAGULATION CONSULT NOTE - Follow Up Consult  Pharmacy Consult for heparin Indication: Rule out ACS   Assessment: 66 yo male presents with paroxysmal SVT, elevated D-dimer placed on IV heparin for r/o PE and r/o acs. CT chest has ruled out PE. Patient scheduled for cath today. Heparin level low this morning (0.12), cath is scheduled ~46minutes, will not make rate change at this time d/t gtt likely to be turned off soon. No bleeding or complications with IV have been noted. Of note however is the patients elevated INR of 1.7? No anticoagulants listed on home med list pta (asa only).  Goal of Therapy:  Heparin level 0.3-0.7 units/ml   Plan:  Follow up plan after cath  No Known Allergies  Patient Measurements: Height: 6' (182.9 cm) Weight: 200 lb (90.719 kg) IBW/kg (Calculated) : 77.6    Vital Signs: Temp: 97.3 F (36.3 C) (02/14 0737) Temp src: Oral (02/14 0737) BP: 124/63 mmHg (02/14 0700) Pulse Rate: 80  (02/14 0700)  Labs:  Basename 01/17/12 0759 01/17/12 0116 01/17/12 0115 01/16/12 2230 01/16/12 2159  HGB 12.0* -- 11.4* -- --  HCT 36.4* -- 34.2* 37.0* --  PLT 247 -- 247 -- 253  APTT -- -- 88* -- --  LABPROT -- -- 20.6* -- --  INR -- -- 1.73* -- --  HEPARINUNFRC 0.12* -- -- -- --  CREATININE -- -- 1.56* 1.40* --  CKTOTAL -- 302* -- -- --  CKMB -- 6.3* -- -- --  TROPONINI -- <0.30 -- -- --   Estimated Creatinine Clearance: 52.5 ml/min (by C-G formula based on Cr of 1.56).   Medications:  Prescriptions prior to admission  Medication Sig Dispense Refill  . aspirin EC 325 MG tablet Take 325 mg by mouth daily.         Norman Rodgers 01/17/2012,8:44 AM

## 2012-01-17 NOTE — Progress Notes (Signed)
UR Completed. Simmons, Kaslyn [place] F [phone]  

## 2012-01-17 NOTE — Consult Note (Signed)
Reason for Consult:recurrent SVT  Referring Physician: [name] is an 66 y.o. male.   HPI: Norman Rodgers is a pleasant 66 yo man with a h/o COPD, who was admitted with worsening Sob and found to have SVT. He was treated with IV adenosine (no relief), then IV verapamil which resulted in return to NSR. He has gone back into SVT which appears to be due to atrial tachycardia. He has associated sob. His troponin was minimally elevated. He denies a h/o syncope. His EF is low by echo (? Tachy induced). He denies chest pain.  ZOX:WRUEAVW reviewed. No pertinent past medical history.  PSHX:History reviewed. No pertinent past surgical history.  FAMHX:History reviewed. No pertinent family history.  Social History:  reports that he has never smoked. He does not have any smokeless tobacco history on file. He reports that he drinks alcohol. He reports that he does not use illicit drugs.  Allergies: No Known Allergies  Ct Angio Chest W/cm &/or Wo Cm  01/17/2012  *RADIOLOGY REPORT*  Clinical Data: Dry cough, difficulty breathing, decreased appetite, chills, and nausea for 6 days.  CT ANGIOGRAPHY CHEST  Technique:  Multidetector CT imaging of the chest using the standard protocol during bolus administration of intravenous contrast. Multiplanar reconstructed images including MIPs were obtained and reviewed to evaluate the vascular anatomy.  Contrast: OMNIPAQUE IOHEXOL 350 MG/ML IV SOLN  Comparison: None.  Findings: Technically adequate study with good opacification of the central and segmental pulmonary arteries.  No focal filling defects demonstrated.  No evidence of significant pulmonary embolus. Normal caliber thoracic aorta with calcification.  Mild cardiac enlargement.  Reflux of contrast material into the IVC and hepatic veins suggesting passive congestion.  Small right pleural effusion. Visualization of the lung fields is limited due to respiratory motion artifact but there is bilateral  patchy perihilar air space disease compatible with perihilar edema or bilateral pneumonia.  No evidence of pneumothorax.  Airways appear patent.  Mild prominence of mediastinal lymph nodes, mild prominence of mediastinal lymph nodes, likely representing reactive nodes.  No significant lymphadenopathy.  Degenerative changes in the thoracic spine.  IMPRESSION: No evidence of significant pulmonary embolus.  The cardiac enlargement with passive congestion of the liver.  Bilateral perihilar air space disease consistent with edema or pneumonia. Small right pleural effusion.  Reactive lymph nodes in the mediastinum.  Original Report Authenticated By: Norman Rodgers, M.D.   Dg Chest Port 1 View  01/16/2012  *RADIOLOGY REPORT*  Clinical Data: Severe shortness of breath.  PORTABLE CHEST - 1 VIEW  Comparison: None.  Findings:  The heart is mildly enlarged.  Moderate pulmonary vascular congestion is evident.  No focal airspace disease is evident.  The visualized soft tissues and bony thorax are unremarkable.  IMPRESSION: Cardiomegaly with moderate pulmonary vascular congestion.  Early congestive heart failure is not excluded.  Original Report Authenticated By: Norman Rodgers. Norman Rodgers, M.D.    ROS  As stated in the HPI and negative for all other systems.  Physical Exam  Vitals:Blood pressure 104/78, pulse 153, temperature 97.3 F (36.3 C), temperature source Oral, resp. rate 27, height 6' (1.829 m), weight 90.719 kg (200 lb), SpO2 97.00%.  Well appearing NAD HEENT: Unremarkable Neck:  No JVD, no thyromegally Lymphatics:  No adenopathy Back:  No CVA tenderness Lungs:  Rales bilaterally. HEART:  Regular tachy rhythm, no murmurs, no rubs, no clicks Abd:  Flat, positive bowel sounds, no organomegally, no rebound, no guarding Ext:  2 plus pulses, no edema,  no cyanosis, no clubbing Skin:  No rashes no nodules Neuro:  CN II through XII intact, motor grossly intact ECG - atrial tachycardia at  150/min. Assessment/Plan: 1. Incessant SVT - I have discussed the treatment options with the patient and this risks/benefits/goals/expectations of the procedure have been discussed with the patient and he wishes to proceed.  Sharlot Gowda TaylorMD 01/17/2012, 2:13 PM

## 2012-01-17 NOTE — Progress Notes (Signed)
  Echocardiogram 2D Echocardiogram has been performed.  Norman Rodgers 01/17/2012, 8:41 AM

## 2012-01-17 NOTE — Consults (Signed)
Intubation Note: The anesthesia team was called to the cath lab holding area to intubate this 66 yr old male in respiratory failure with multiple medical problems who just under cath procedure by Dr. Ladona Ridgel under sedation. At arrival patient was in respiratory distress thus decision was made to intubate patient. Dr. Ladona Ridgel was in the holding area. Etomidate 8mg  and succinylcholine 100mg  given. 7.5 ETT placed with Mac blade/RSI by CRNA without difficulty. ETCO2 by [employer]. BLBS.  The patient tolerated the procedure well .  Plan CXR ordered and Dr. Ladona Ridgel present who will manage care. CE

## 2012-01-17 NOTE — Progress Notes (Signed)
Dr. Sharyn Lull notified of increased potassium, BUN and Creatinine.  Will continue to monitor.

## 2012-01-17 NOTE — Transfer of Care (Signed)
Immediate Anesthesia Transfer of Care Note  Patient: Norman Rodgers  Procedure(s) Performed: Procedure(s) (LRB): SUPRAVENTRICULAR TACHYCARDIA ABLATION (N/A)  Patient Location: PACU  Anesthesia Type: General  Level of Consciousness: awake  Airway & Oxygen Therapy: Patient Spontanous Breathing  Post-op Assessment: Report given to PACU RN  Post vital signs: Reviewed  Complications: No apparent anesthesia complications

## 2012-01-17 NOTE — Progress Notes (Signed)
Subjective:  Patient denies any chest pain complaints of shortness of breath and palpitation. Patient converted back into supraventricular tachycardia appears to be atrial tachycardia. Discussed earlier with Dr.Allred regarding SVT ablation. Dr. Ladona Ridgel is discussing with patient regarding atrial tachycardia ablation later today. Her 2-D echo showed global hypokinesia with no apparent segmental wall motion abnormalities cardiac enzymes are minimally elevated which attributed to tachycardia. Will not pursue ischemic workup for now in view of recurrent SVT and congestive heart failure and serially depressed LV systolic function probably tachycardia induced/EtOH. Will increase beta blockers and ACE inhibitors as his blood pressure tolerates and renal function tolerates  Objective:  Vital Signs in the last 24 hours: Temp:  [97.3 F (36.3 C)-98.5 F (36.9 C)] 97.3 F (36.3 C) (02/14 1400) Pulse Rate:  [42-168] 138  (02/14 1400) Resp:  [6-57] 38  (02/14 1400) BP: (81-133)/(40-94) 97/69 mmHg (02/14 1400) SpO2:  [88 %-100 %] 98 % (02/14 1400) FiO2 (%):  [6 %-50 %] 50 % (02/14 1000) Weight:  [90.719 kg (200 lb)] 90.719 kg (200 lb) (02/13 2151)  Intake/Output from previous day: 02/13 0701 - 02/14 0700 In: 502 [P.O.:120; I.V.:380; IV Piggyback:2] Out: 300 [Urine:300] Intake/Output from this shift: Total I/O In: 945.3 [P.O.:120; I.V.:525.3; IV Piggyback:300] Out: 700 [Urine:700]  Physical Exam: General appearance: alert and cooperative Neck: JVD - a few cm above sternal notch, no carotid bruit and supple, symmetrical, trachea midline Lungs: Decreased breath sounds at bases with faint rales Heart: Tachycardic S1 and S2 soft Abdomen: Soft distended bowel sounds present Extremities: extremities normal, atraumatic, no cyanosis or edema  Lab Results:  Basename 01/17/12 0759 01/17/12 0115  WBC 12.8* 9.5  HGB 12.0* 11.4*  PLT 247 247    Basename 01/17/12 0759 01/17/12 0115  NA 141 136  K  5.4* 5.2*  CL 106 104  CO2 14* 15*  GLUCOSE 98 108*  BUN 36* 29*  CREATININE 1.64* 1.56*    Basename 01/17/12 1309 01/17/12 0759  TROPONINI <0.30 0.32*   Hepatic Function Panel  Basename 01/17/12 0115  PROT 7.7  ALBUMIN 2.9*  AST 70*  ALT 58*  ALKPHOS 62  BILITOT 1.2  BILIDIR --  IBILI --    Basename 01/17/12 0759  CHOL 134   No results found for this basename: PROTIME in the last 72 hours  Imaging: Imaging results have been reviewed and Ct Angio Chest W/cm &/or Wo Cm  01/17/2012  *RADIOLOGY REPORT*  Clinical Data: Dry cough, difficulty breathing, decreased appetite, chills, and nausea for 6 days.  CT ANGIOGRAPHY CHEST  Technique:  Multidetector CT imaging of the chest using the standard protocol during bolus administration of intravenous contrast. Multiplanar reconstructed images including MIPs were obtained and reviewed to evaluate the vascular anatomy.  Contrast: OMNIPAQUE IOHEXOL 350 MG/ML IV SOLN  Comparison: None.  Findings: Technically adequate study with good opacification of the central and segmental pulmonary arteries.  No focal filling defects demonstrated.  No evidence of significant pulmonary embolus. Normal caliber thoracic aorta with calcification.  Mild cardiac enlargement.  Reflux of contrast material into the IVC and hepatic veins suggesting passive congestion.  Small right pleural effusion. Visualization of the lung fields is limited due to respiratory motion artifact but there is bilateral patchy perihilar air space disease compatible with perihilar edema or bilateral pneumonia.  No evidence of pneumothorax.  Airways appear patent.  Mild prominence of mediastinal lymph nodes, mild prominence of mediastinal lymph nodes, likely representing reactive nodes.  No significant lymphadenopathy.  Degenerative changes  in the thoracic spine.  IMPRESSION: No evidence of significant pulmonary embolus.  The cardiac enlargement with passive congestion of the liver.   Bilateral perihilar air space disease consistent with edema or pneumonia. Small right pleural effusion.  Reactive lymph nodes in the mediastinum.  Original Report Authenticated By: Marlon Pel, M.D.   Dg Chest Port 1 View  01/16/2012  *RADIOLOGY REPORT*  Clinical Data: Severe shortness of breath.  PORTABLE CHEST - 1 VIEW  Comparison: None.  Findings:  The heart is mildly enlarged.  Moderate pulmonary vascular congestion is evident.  No focal airspace disease is evident.  The visualized soft tissues and bony thorax are unremarkable.  IMPRESSION: Cardiomegaly with moderate pulmonary vascular congestion.  Early congestive heart failure is not excluded.  Original Report Authenticated By: Jamesetta Orleans. MATTERN, M.D.    Cardiac Studies:  Assessment/Plan:  Recurrent SVT probable atrial tachycardia Decompensated systolic heart failure multi-factorial i.e. tachycardia induced/EtOH Hypertension COPD Tobacco abuse Morbid obesity Mild renal insufficiency Plan IV amiodarone/digoxin per orders EP consult We'll cancel cardiac catheterization discussed with patient and agrees  LOS: 1 day    Jameek Bruntz N 01/17/2012, 6:27 PM

## 2012-01-17 NOTE — Anesthesia Procedure Notes (Addendum)
Procedures

## 2012-01-18 ENCOUNTER — Encounter (HOSPITAL_COMMUNITY)
Admission: EM | Disposition: A | Discharge status: Home / Self Care | Payer: Self-pay | Source: Home / Self Care | Attending: Cardiology

## 2012-01-18 ENCOUNTER — Inpatient Hospital Stay (HOSPITAL_COMMUNITY): Payer: 59

## 2012-01-18 DIAGNOSIS — J9600 Acute respiratory failure, unspecified whether with hypoxia or hypercapnia: Secondary | ICD-10-CM

## 2012-01-18 DIAGNOSIS — G9340 Encephalopathy, unspecified: Secondary | ICD-10-CM

## 2012-01-18 DIAGNOSIS — I498 Other specified cardiac arrhythmias: Secondary | ICD-10-CM

## 2012-01-18 DIAGNOSIS — I509 Heart failure, unspecified: Secondary | ICD-10-CM

## 2012-01-18 LAB — CARDIAC PANEL(CRET KIN+CKTOT+MB+TROPI): Relative Index: 1.5 (ref 0.0–2.5)

## 2012-01-18 LAB — GLUCOSE, CAPILLARY
Glucose-Capillary: 124 mg/dL — ABNORMAL HIGH (ref 70–99)
Glucose-Capillary: 146 mg/dL — ABNORMAL HIGH (ref 70–99)
Glucose-Capillary: 147 mg/dL — ABNORMAL HIGH (ref 70–99)
Glucose-Capillary: 139 mg/dL — ABNORMAL HIGH (ref 70–99)
Glucose-Capillary: 113 mg/dL — ABNORMAL HIGH (ref 70–99)

## 2012-01-18 LAB — CBC
Hemoglobin: 11.2 g/dL — ABNORMAL LOW (ref 13.0–17.0)
Hemoglobin: 11.5 g/dL — ABNORMAL LOW (ref 13.0–17.0)
MCH: 29.9 pg (ref 26.0–34.0)
MCHC: 32.9 g/dL (ref 30.0–36.0)
MCV: 92 fL (ref 78.0–100.0)
RBC: 3.74 MIL/uL — ABNORMAL LOW (ref 4.22–5.81)
RBC: 3.82 MIL/uL — ABNORMAL LOW (ref 4.22–5.81)

## 2012-01-18 LAB — COMPREHENSIVE METABOLIC PANEL
ALT: 340 U/L — ABNORMAL HIGH (ref 0–53)
AST: 489 U/L — ABNORMAL HIGH (ref 0–37)
Alkaline Phosphatase: 69 U/L (ref 39–117)
GFR calc Af Amer: 46 mL/min — ABNORMAL LOW (ref >90)
Glucose, Bld: 156 mg/dL — ABNORMAL HIGH (ref 70–99)
Potassium: 4.8 mEq/L (ref 3.5–5.1)
Sodium: 135 mEq/L (ref 135–145)
Total Protein: 7.3 g/dL (ref 6.0–8.3)

## 2012-01-18 LAB — LEGIONELLA ANTIGEN, URINE: Legionella Antigen, Urine: NEGATIVE

## 2012-01-18 LAB — STREP PNEUMONIAE URINARY ANTIGEN: Strep Pneumo Urinary Antigen: NEGATIVE

## 2012-01-18 SURGERY — PERMANENT PACEMAKER INSERTION
Anesthesia: LOCAL

## 2012-01-18 MED ORDER — HEPARIN SODIUM (PORCINE) 5000 UNIT/ML IJ SOLN
5000.0000 [IU] | Freq: Two times a day (BID) | INTRAMUSCULAR | Status: DC
  Administered 2012-01-18 – 2012-01-22 (×8): 5000 [IU] via SUBCUTANEOUS
  Filled 2012-01-18 (×10): qty 1

## 2012-01-18 MED ORDER — BIOTENE DRY MOUTH MT LIQD
15.0000 mL | OROMUCOSAL | Status: DC
  Administered 2012-01-18 – 2012-01-21 (×19): 15 mL via OROMUCOSAL

## 2012-01-18 MED ORDER — DIGOXIN 0.25 MG/ML IJ SOLN
0.2500 mg | INTRAMUSCULAR | Status: AC
  Administered 2012-01-18: 0.25 mg via INTRAVENOUS
  Filled 2012-01-18: qty 1

## 2012-01-18 MED ORDER — MIDAZOLAM HCL 2 MG/2ML IJ SOLN
2.0000 mg | INTRAMUSCULAR | Status: DC | PRN
  Administered 2012-01-19 (×2): 4 mg via INTRAVENOUS
  Administered 2012-01-19: 2 mg via INTRAVENOUS
  Filled 2012-01-18: qty 4
  Filled 2012-01-18: qty 2
  Filled 2012-01-18: qty 4

## 2012-01-18 MED ORDER — FENTANYL CITRATE 0.05 MG/ML IJ SOLN
25.0000 ug | INTRAMUSCULAR | Status: DC | PRN
  Administered 2012-01-19 (×3): 50 ug via INTRAVENOUS
  Filled 2012-01-18 (×2): qty 2

## 2012-01-18 MED ORDER — CARVEDILOL 6.25 MG PO TABS
6.2500 mg | ORAL_TABLET | Freq: Two times a day (BID) | ORAL | Status: DC
  Administered 2012-01-18 – 2012-01-20 (×3): 6.25 mg via ORAL
  Filled 2012-01-18 (×6): qty 1

## 2012-01-18 MED ORDER — SODIUM CHLORIDE 0.9 % IV SOLN
0.2000 ug/kg/h | INTRAVENOUS | Status: DC
  Administered 2012-01-18: 0.3 ug/kg/h via INTRAVENOUS
  Administered 2012-01-18: 0.8 ug/kg/h via INTRAVENOUS
  Administered 2012-01-18: 0.2 ug/kg/h via INTRAVENOUS
  Administered 2012-01-19 (×2): 0.8 ug/kg/h via INTRAVENOUS
  Filled 2012-01-18 (×5): qty 2

## 2012-01-18 NOTE — Plan of Care (Signed)
Problem: Phase I Progression Outcomes Goal: Pneumonia/flu vaccination screen completed Outcome: Not Met (add Reason) Pt. refused

## 2012-01-18 NOTE — Op Note (Signed)
NAMEWASIM, HURLBUT NO.:  [id number]  MEDICAL RECORD NO.:  [id number]  LOCATION:  2902                         FACILITY:  [facility]  PHYSICIAN:  Doylene Canning. Ladona Ridgel, MD    DATE OF BIRTH:  August 04, 1946  DATE OF PROCEDURE:  01/17/2012 DATE OF DISCHARGE:                              OPERATIVE REPORT   PROCEDURE PERFORMED:  Electrophysiologic study and catheter ablation of atrial tachycardia.  INTRODUCTION:  The patient is a 66 year old man who for the last 2 weeks has had increasing shortness of breath.  He presented to the hospital with SVT, which was refractory to adenosine.  It did slow down with intravenous calcium channel blockers.  The patient was found to have a severe left ventricular dysfunction with an ejection fraction of 15-20% by echo.  Because of his incessant SVT despite medical therapy with calcium channel blockers as well as amiodarone, he is referred now for catheter ablation.  PROCEDURE:  After informed was obtained, the patient was taken to the diagnostic EP lab in a fasting state.  After usual preparation and draping, intravenous fentanyl and midazolam were given for sedation.  Of note, the patient was more difficult than usual to sedate.  I initially considered attempting general anesthesia on this patient, but because of the nature of his tachycardia, it was concerned that heavy sedation with general anesthesia would resulted in inability to induce the tachycardia.  For this reason, he was treated with fentanyl and Versed. A 6-French octapolar catheter was inserted percutaneously in the left femoral vein and advanced into the coronary sinus.  A 6-French quadripolar catheter was inserted percutaneously into the left femoral vein and advanced the HIS bundle region.  A 7-French quadripolar ablation catheter was inserted percutaneously in the left femoral vein and advanced to the right atrium.  With manipulation of the catheters, the patient was in  incessant atrial tachycardia.  Atrial tachycardia was demonstrated by transient AV block with persistence of tachycardia.  The tachycardia cycle length was around 410 msec.  3D electroanatomic mapping was subsequently carried out.  It demonstrated that the earliest atrial activation was on the lateral wall of the right atrium, approximately 9 o'clock in the LAO projection.  Multiple RF energy applications were subsequently delivered to this region in attempts to terminate the tachycardia.  During RF energy application, the tachycardia would terminate, but was always re-inducible.  At this point, a 20-pole Halo catheter was inserted percutaneously through the left femoral vein and advanced to the right atrium.  Additional mapping was carried out demonstrating that the earliest atrial activation was in fact along the tricuspid valve annulus, just posterior to the annulus but anterior to the 20-pole Halo catheter.  Again, RF energy application was applied to this region between 9 and 10 o'clock in the LAO projection.  RF energy application would result in termination of the patient's tachycardia, but it was always persistently re-inducible. Interestingly, after RF energy application, the atrial activation sequence was found to be slightly changing, occurring anywhere from 8 o'clock on the tricuspid valve annulus to 11 o'clock.  Additional mapping was attempted and the patient became more and more difficult to sedate and to control on  the table.  In fact, the patient attempted to pull out his catheters and then doing so, contaminated the field.  At this point, it was the most appropriate to return the patient to his room and abandon the procedure.  At this point, if the procedure was to be re-attempted, general anesthesia would be required despite the risk that the patient's SVT would not be inducible with general anesthesia.  COMPLICATIONS:  There are no immediate procedure  complications.  RESULTS:  This demonstrates an unsuccessful electrophysiologic study and catheter ablation of a persistent right atrial tachycardia.  This was despite extensive 3D electroanatomic mapping as well as activation mapping utilizing a 20-pole Halo catheter.  A total of 25 RF energy applications were delivered, all which were fairly brief in duration.     Doylene Canning. Ladona Ridgel, MD     GWT/MEDQ  D:  01/17/2012  T:  01/18/2012  Job:  [id number]  cc:   Eduardo Osier. Sharyn Lull, M.D.

## 2012-01-18 NOTE — Consult Note (Signed)
Name: Norman Rodgers MRN: [id number] DOB: 1946/11/30  LOS: 2  CRITICAL CARE ADMISSION NOTE  History of Present Illness: 66 y/o male with no past medical history presented to [facility] ED on 2/13 with several days of dyspnea and malaise.  He was found to be in SVT and likely volume overload and was admitted to cardiology for further management.  He underwent an RFA today for recurrent SVT atrial tachycardia and was somnolent post procedure so was electively intubated.  PCCM consulted for further management.  Lines / Drains: 2/14 ETT >>  [facility] Protonix Heprin sq  Cultures / Sepsis markers: 2/14 blood >> 2/14 sputum >> 2/14 urine st/leg ag >> NEG  Results for orders placed during the hospital encounter of 01/16/12  MRSA PCR SCREENING     Status: Normal   Collection Time   01/17/12  1:14 AM      Component Value Range Status Comment   MRSA by PCR NEGATIVE  NEGATIVE  Final      Antibiotics: 2/14 Ceft (pneumonia?) >> 2/14 azithro (pneumonia?) >> 2/14 vanc (pneumonia?) >> 2/14  Anti-infectives     Start     Dose/Rate Route Frequency Ordered Stop   01/17/12 2000   vancomycin (VANCOCIN) 2,000 mg in sodium chloride 0.9 % 500 mL IVPB  Status:  Discontinued        2,000 mg 250 mL/hr over 120 Minutes Intravenous  Once 01/17/12 1824 01/17/12 2007   01/17/12 0745   cefTRIAXone (ROCEPHIN) 1 g in dextrose 5 % 50 mL IVPB     Comments: First dose stat      1 g 100 mL/hr over 30 Minutes Intravenous Every 24 hours 01/17/12 0741     01/17/12 0745   azithromycin (ZITHROMAX) 500 mg in dextrose 5 % 250 mL IVPB        500 mg 250 mL/hr over 60 Minutes Intravenous Every 24 hours 01/17/12 0741             Tests / Events: 2/14 EP study, ablation for SVT  2/14 TTE: global hypokinesis of LV, LVEF 20% 2/14 CT angio    SUBJECTIVE/OVERNIGHT?INTERVAL HX  not on pressors Aigtated RASS + 4 on wua with fentanyl and versed Does not meet SBT criteria  Vital Signs:   Filed Vitals:   01/18/12  0600 01/18/12 0700 01/18/12 0800 01/18/12 0821  BP: 117/74 109/77 106/78 109/86  Pulse: 73 72 74 123  Temp:    98.1 F (36.7 C)  TempSrc:    Oral  Resp: 22 22 22 23   Height:      Weight:      SpO2: 94% 96% 97% 95%    Physical Examination: Gen: sedated on vent HEENT: NCAT, PERRL, EOMi, OP clear, ETT in place Neck: supple without masses PULM:CTA bilaterally CV: RRR, cannot appreciate S3, elevated JVP noted AB: BS+, soft, nontender, no hsm Ext: warm, no edema, no clubbing, no cyanosis Derm: no rash or skin breakdown Neuro: sedated on vent,RASS + 4 on WUA Psyche: cannot assess  Labs and Imaging:    CBC    Component Value Date/Time   WBC 7.9 01/18/2012 0500   RBC 3.74* 01/18/2012 0500   HGB 11.2* 01/18/2012 0500   HCT 34.4* 01/18/2012 0500   PLT 178 01/18/2012 0500   MCV 92.0 01/18/2012 0500   MCH 29.9 01/18/2012 0500   MCHC 32.6 01/18/2012 0500   RDW 15.8* 01/18/2012 0500   LYMPHSABS 2.2 01/17/2012 0115   MONOABS 0.6 01/17/2012 0115  EOSABS 0.0 01/17/2012 0115   BASOSABS 0.0 01/17/2012 0115    BMET    Component Value Date/Time   NA 141 01/17/2012 0759   K 5.4* 01/17/2012 0759   CL 106 01/17/2012 0759   CO2 14* 01/17/2012 0759   GLUCOSE 98 01/17/2012 0759   BUN 36* 01/17/2012 0759   CREATININE 1.64* 01/17/2012 0759   CALCIUM 9.5 01/17/2012 0759   GFRNONAA 43* 01/17/2012 0759   GFRAA 49* 01/17/2012 0759     Lab 01/18/12 0500 01/17/12 0759 01/17/12 0115  HGB 11.2* 12.0* 11.4*  HCT 34.4* 36.4* 34.2*  WBC 7.9 12.8* 9.5  PLT 178 247 247     Lab 01/17/12 0759 01/17/12 0115 01/16/12 2230  NA 141 136 142  K 5.4* 5.2* --  CL 106 104 109  CO2 14* 15* --  GLUCOSE 98 108* 125*  BUN 36* 29* 29*  CREATININE 1.64* 1.56* 1.40*  CALCIUM 9.5 8.9 --  MG -- 2.3 --  PHOS -- -- --     Lab 01/17/12 2038 01/16/12 2230  PHART 7.323* --  PCO2ART 38.7 --  PO2ART 85.5 --  HCO3 19.5* --  TCO2 20.7 19  O2SAT 94.6 --   No results found for this basename: PROCALCITON:5 in the last 168  hours    Lab 01/17/12 1309 01/17/12 0759 01/17/12 0116  TROPONINI <0.30 0.32* <0.30   No results found for this basename: PROBNP:5 in the last 168 hours Ct Angio Chest W/cm &/or Wo Cm  01/17/2012  *RADIOLOGY REPORT*  Clinical Data: Dry cough, difficulty breathing, decreased appetite, chills, and nausea for 6 days.  CT ANGIOGRAPHY CHEST  Technique:  Multidetector CT imaging of the chest using the standard protocol during bolus administration of intravenous contrast. Multiplanar reconstructed images including MIPs were obtained and reviewed to evaluate the vascular anatomy.  Contrast: OMNIPAQUE IOHEXOL 350 MG/ML IV SOLN  Comparison: None.  Findings: Technically adequate study with good opacification of the central and segmental pulmonary arteries.  No focal filling defects demonstrated.  No evidence of significant pulmonary embolus. Normal caliber thoracic aorta with calcification.  Mild cardiac enlargement.  Reflux of contrast material into the IVC and hepatic veins suggesting passive congestion.  Small right pleural effusion. Visualization of the lung fields is limited due to respiratory motion artifact but there is bilateral patchy perihilar air space disease compatible with perihilar edema or bilateral pneumonia.  No evidence of pneumothorax.  Airways appear patent.  Mild prominence of mediastinal lymph nodes, mild prominence of mediastinal lymph nodes, likely representing reactive nodes.  No significant lymphadenopathy.  Degenerative changes in the thoracic spine.  IMPRESSION: No evidence of significant pulmonary embolus.  The cardiac enlargement with passive congestion of the liver.  Bilateral perihilar air space disease consistent with edema or pneumonia. Small right pleural effusion.  Reactive lymph nodes in the mediastinum.  Original Report Authenticated By: Marlon Pel, M.D.   Portable Chest Xray In Am  01/18/2012  *RADIOLOGY REPORT*  Clinical Data: Check endotracheal tube position.   PORTABLE CHEST - 1 VIEW  Comparison: Chest x-ray 01/17/2012.  Findings: Tip of endotracheal tube has been retracted slightly, now just below the level of the thoracic inlet.  Nasogastric tube seen extending into the stomach (tip of tube extends below the lower margin of the image).  Lung volumes remain low.  There is extensive consolidation in the lungs bilaterally, particularly in the left perihilar region, concerning for multilobar pneumonia.  No definite pleural effusions (right costophrenic sulcus is excluded from  the image).  In the well aerated portions of the lung, there does not appear to be significant cephalization of the pulmonary vasculature.  Heart size remains moderately enlarged. The patient is rotated to the right on today's exam, resulting in distortion of the mediastinal contours and reduced diagnostic sensitivity and specificity for mediastinal pathology.  Despite this rotation, there is potential mediastinal widening, suggestive of adenopathy (likely reactive).  IMPRESSION: 1.  Support apparatus, as above. 2.  Extensive patchy bilateral but asymmetric air space consolidation redemonstrated, concerning for multilobar pneumonia. Given the lack of cephalization of pulmonary vasculature, and the asymmetry of airspace consolidation, pulmonary edema is not favored. 3.  Moderate cardiomegaly unchanged.  Original Report Authenticated By: Florencia Reasons, M.D.   Dg Chest Port 1 View  01/17/2012  *RADIOLOGY REPORT*  Clinical Data: Post intubation  PORTABLE CHEST - 1 VIEW  Comparison: 01/16/2012; chest CTA - 01/17/2012  Findings: Grossly unchanged enlarged cardiac silhouette and mediastinal contours.  Interval intubation with endotracheal tube overlying tracheal air column with tips appear the carina.  No definite pneumothorax.  Increased conspicuity of left greater than right perihilar and upper lung predominant heterogeneous airspace opacities as demonstrated on chest CT performed earlier same day.  Likely unchanged small right-sided pleural effusion.  Grossly unchanged bones.  IMPRESSION: 1.  Endotracheal tube overlies tracheal air column with tip superior to the carina.  No pneumothorax. 2.  Increased conspicuity of bilateral perihilar and upper lobe predominant heterogeneous air space opacities, left greater than right.  Differential considerations include multifocal infection and alveolar pulmonary edema.  Original Report Authenticated By: Waynard Reeds, M.D.   Dg Chest Port 1 View  01/16/2012  *RADIOLOGY REPORT*  Clinical Data: Severe shortness of breath.  PORTABLE CHEST - 1 VIEW  Comparison: None.  Findings:  The heart is mildly enlarged.  Moderate pulmonary vascular congestion is evident.  No focal airspace disease is evident.  The visualized soft tissues and bony thorax are unremarkable.  IMPRESSION: Cardiomegaly with moderate pulmonary vascular congestion.  Early congestive heart failure is not excluded.  Original Report Authenticated By: Jamesetta Orleans. MATTERN, M.D.      Assessment and Plan:  This is a 66 y/o male with no past medical history but a significant alcohol use history presented on 2/13 with respiratory distress and SVT.  Intubated for respiratory insufficiency after EP study/ablation today.  Appears to be grossly volume overloaded.  Ddx of resp failure includes pneumonia however his wife reports no sputum production or fever prior to admission and he has not had fever here so I doubt that he has pneumonia.  ACute Respiratory failure (01/17/2012)   Assessment: due to volume overload +/- pneumonia. WUA 01/18/2012 comlicated by delirium. Does not meet SBT criteria   Plan:  - continued diuresis -full vent support   SVT (supraventricular tachycardia) (01/16/2012)   Assessment: s/p ablation today, related to EtOH abuse?   Plan:  -per cardiology  CHF (congestive heart failure) (01/17/2012)   Assessment: due to alcohol abuse vs. SVT? still volume up 2/14   Plan:  - diurese  and remainder of care/plan per cardiology  CAD (coronary artery disease) (01/17/2012)   Assessment: question of old ant MI.    Plan:  -per cardiology  Pneumonia (01/17/2012)   Assessment: Radiologist favoring poneumonia on cxr 01/18/12  edema   Plan:  -continue ceftriaxone and azithro for now -f/u cultures/biomarkers  Alcohol abuse/Agitation/Delirium/Enceophalopathy (01/17/2012)   Assessment:  Significant agitation  On 01/18/12   Plan:  -daily thiamine/folate/mvi  supplementation - Start precedex 01/18/12   Best practices / Disposition: PCCM consulting, Dr. Sharyn Lull primary physician  Feeding/protein malnutrition:  Analgesia: fentanyl gtt Sedation: versed prn Thromboprophylaxis: heparin HOB >30 degrees Ulcer prophylaxis: ppi  Glucose control/hyperglycemia: monitor cbg Family : wife updated 01/18/12  The patient is critically ill with multiple organ systems failure and requires high complexity decision making for assessment and support, frequent evaluation and titration of therapies, application of advanced monitoring technologies and extensive interpretation of multiple databases. Critical Care Time devoted to patient care services described in this note is  45 minutes.  Dr. Kalman Shan, M.D., [facility].C.P Pulmonary and Critical Care Medicine Staff Physician [facility] Pager: [phone], If no answer or between  15:00h - 7:00h: call [phone]  01/18/2012 10:44 AM

## 2012-01-18 NOTE — Progress Notes (Signed)
INITIAL ADULT NUTRITION ASSESSMENT Date: 01/18/2012   Time: 2:12 PM  Reason for Assessment: VDRF  ASSESSMENT: Male 66 y.o.  Dx: SVT (supraventricular tachycardia)  Hx: History reviewed. No pertinent past medical history.  Related Meds:     . antiseptic oral rinse  15 mL Mouth Rinse Q4H  . aspirin EC  81 mg Oral Daily  . azithromycin  500 mg Intravenous Q24H  . bupivacaine      . carvedilol  6.25 mg Oral BID WC  . cefTRIAXone (ROCEPHIN) IVPB 1 gram/50 mL D5W  1 g Intravenous Q24H  . chlorhexidine  15 mL Mouth/Throat Q12H  . chlorhexidine      . digoxin  0.25 mg Intravenous STAT  . fentaNYL      . fentaNYL      . folic acid  1 mg Intravenous Daily  . furosemide  80 mg Intravenous Once  . furosemide  80 mg Intravenous Q6H  . heparin subcutaneous  5,000 Units Subcutaneous Q12H  . midazolam      . midazolam      . midazolam      . midazolam      . midazolam  4 mg Intravenous Once  . mulitivitamin  5 mL Oral Daily  . pantoprazole (PROTONIX) IV  40 mg Intravenous Daily  . thiamine  100 mg Intravenous Daily  . DISCONTD: diazepam  5 mg Oral On Call  . DISCONTD: digoxin  0.25 mg Intravenous Daily  . DISCONTD: metoprolol tartrate  25 mg Oral BID  . DISCONTD: pantoprazole  40 mg Oral Q0600  . DISCONTD: rosuvastatin  20 mg Oral Daily  . DISCONTD: sodium chloride  3 mL Intravenous Q12H  . DISCONTD: vancomycin  2,000 mg Intravenous Once  . DISCONTD: verapamil  2.5 mg Intravenous Once   Ht: 6' (182.9 cm)  Wt: 200 lb (90.719 kg)  Ideal Wt: 80.9 kg % Ideal Wt: 112%  Usual Wt: unable to obtain % Usual Wt: n/a  Body mass index is 27.12 kg/(m^2). Pt is overweight.  Food/Nutrition Related Hx: significant hx of EtOH abuse  Labs:  CMP     Component Value Date/Time   NA 135 01/18/2012 1057   K 4.8 01/18/2012 1057   CL 102 01/18/2012 1057   CO2 19 01/18/2012 1057   GLUCOSE 156* 01/18/2012 1057   BUN 50* 01/18/2012 1057   CREATININE 1.75* 01/18/2012 1057   CALCIUM 8.6 01/18/2012  1057   PROT 7.3 01/18/2012 1057   ALBUMIN 2.6* 01/18/2012 1057   AST 489* 01/18/2012 1057   ALT 340* 01/18/2012 1057   ALKPHOS 69 01/18/2012 1057   BILITOT 0.6 01/18/2012 1057   GFRNONAA 39* 01/18/2012 1057   GFRAA 46* 01/18/2012 1057  Magnesium 2.3   CBG (last 3)   Basename 01/18/12 1205 01/18/12 0820 01/18/12 0420  GLUCAP 139* 147* 124*   No results found for this basename: HGBA1C   Intake/Output: I/O last 3 completed shifts: In: 2248.7 [P.O.:240; I.V.:1698.7; IV Piggyback:310] Out: 3875 [Urine:3875] Total I/O In: 750.8 [I.V.:440.8; IV Piggyback:310] Out: 860 [Urine:860]  Diet Order: NPO  Supplements/Tube Feeding: none  IVF:    sodium chloride Last Rate: 10 mL/hr at 01/17/12 2234  sodium chloride Last Rate: 10 mL/hr at 01/17/12 2244  sodium chloride Last Rate: 10 mL/hr at 01/18/12 0539  amiodarone (NEXTERONE PREMIX) 360 mg/200 mL dextrose Last Rate: 60 mg/hr (01/18/12 0957)  dexmedetomidine (PRECEDEX) IV infusion Last Rate: 0.2 mcg/kg/hr (01/18/12 1241)  DISCONTD: sodium chloride Last Rate: 50 mL/hr at 01/17/12  0203  DISCONTD: amiodarone (CORDARONE) infusion   DISCONTD: amiodarone (NEXTERONE PREMIX) 360 mg/200 mL dextrose Last Rate: 0.5 mg/min (01/17/12 1455)  DISCONTD: amiodarone (NEXTERONE PREMIX) 360 mg/200 mL dextrose   DISCONTD: amiodarone (NEXTERONE PREMIX) 360 mg/200 mL dextrose   DISCONTD: amiodarone (NEXTERONE PREMIX) 360 mg/200 mL dextrose   DISCONTD: fentaNYL infusion INTRAVENOUS   DISCONTD: fentaNYL infusion INTRAVENOUS Last Rate: 75 mcg/hr (01/17/12 1952)  DISCONTD: heparin Last Rate: 1,200 Units/hr (01/17/12 1036)  DISCONTD: midazolam (VERSED) infusion   DISCONTD: midazolam (VERSED) infusion Last Rate: 2 mg/hr (01/17/12 2230)   Estimated Nutritional Needs:   Kcal:  2035 kcal Protein: 110 - 125 grams Fluid:  2 - 2.2 L/d  Pt with hx of tobacco and alcohol abuse. Admitted with generalized malaise weakness and SOB x 4 days. Per H&P, pt states he drinks  approximately one pint of hard liquor (? Daily) for last 12 years.  Intubated 2/14. Per MD, pt is grossly volume overloaded. Pt continues with diuresis and full vent support at this time.  NUTRITION DIAGNOSIS: -Inadequate oral intake (NI-2.1).  Status: Ongoing  RELATED TO: inability to eat  AS EVIDENCE BY: NPO status.  MONITORING/EVALUATION(Goals): Goal: If unable to extubate within 24-48 hours, recommend initiation of EN. EN to meet >/= 90% of estimated needs. Monitor: weights, labs, extubation, I/O's  EDUCATION NEEDS: -No education needs identified at this time  INTERVENTION: 1. If unable to extubate within 24-48 hours, recommend initiation of nutrition support. If EN warranted, initiate enteral formula of Osmolite 1.5 at 20 ml/hr, increase by 10 ml/hr, every 4 hours or as tolerated, to goal of 50 ml/hr. 30 ml Prostat liquid protein via tube TID. TF regimen will provide: 2016 kcal, 121 grams protein, 914 ml free water. 2. RD to follow nutrition care plan  Dietitian #: [phone]  DOCUMENTATION CODES Per approved criteria  -Not Applicable    Adair Laundry 01/18/2012, 2:12 PM

## 2012-01-18 NOTE — Progress Notes (Signed)
Subjective:  Events post EP study and the atrial tachyarrhythmia  noted. Patient remains intubated and sedated Patient remains in sinus rhythm with junctional escape rhythm.  Objective:  Vital Signs in the last 24 hours: Temp:  [97.3 F (36.3 C)-97.7 F (36.5 C)] 97.5 F (36.4 C) (02/15 0430) Pulse Rate:  [65-158] 72  (02/15 0700) Resp:  [17-38] 22  (02/15 0700) BP: (94-133)/(66-86) 109/77 mmHg (02/15 0700) SpO2:  [93 %-99 %] 96 % (02/15 0700) FiO2 (%):  [40 %-100 %] 40 % (02/15 0714)  Intake/Output from previous day: 02/14 0701 - 02/15 0700 In: 1678.4 [P.O.:120; I.V.:1250.4; IV Piggyback:308] Out: 3575 [Urine:3575] Intake/Output from this shift:    Physical Exam: General appearance:  patient sedated intubated Neck: no carotid bruit, no JVD and supple, symmetrical, trachea midline Lungs: Decreased breath sounds at bases with bilateral rhonchi and rales Heart: regular rate and rhythm, S1, S2 normal and Soft systolic murmur and S3 gallop noted Abdomen: soft, non-tender; bowel sounds normal; no masses,  no organomegaly Extremities: extremities normal, atraumatic, no cyanosis or edema  Lab Results:  Basename 01/18/12 0500 01/17/12 0759  WBC 7.9 12.8*  HGB 11.2* 12.0*  PLT 178 247    Basename 01/17/12 0759 01/17/12 0115  NA 141 136  K 5.4* 5.2*  CL 106 104  CO2 14* 15*  GLUCOSE 98 108*  BUN 36* 29*  CREATININE 1.64* 1.56*    Basename 01/17/12 1309 01/17/12 0759  TROPONINI <0.30 0.32*   Hepatic Function Panel  Basename 01/17/12 0115  PROT 7.7  ALBUMIN 2.9*  AST 70*  ALT 58*  ALKPHOS 62  BILITOT 1.2  BILIDIR --  IBILI --    Basename 01/17/12 0759  CHOL 134   No results found for this basename: PROTIME in the last 72 hours  Imaging: Imaging results have been reviewed and Ct Angio Chest W/cm &/or Wo Cm  01/17/2012  *RADIOLOGY REPORT*  Clinical Data: Dry cough, difficulty breathing, decreased appetite, chills, and nausea for 6 days.  CT ANGIOGRAPHY CHEST   Technique:  Multidetector CT imaging of the chest using the standard protocol during bolus administration of intravenous contrast. Multiplanar reconstructed images including MIPs were obtained and reviewed to evaluate the vascular anatomy.  Contrast: OMNIPAQUE IOHEXOL 350 MG/ML IV SOLN  Comparison: None.  Findings: Technically adequate study with good opacification of the central and segmental pulmonary arteries.  No focal filling defects demonstrated.  No evidence of significant pulmonary embolus. Normal caliber thoracic aorta with calcification.  Mild cardiac enlargement.  Reflux of contrast material into the IVC and hepatic veins suggesting passive congestion.  Small right pleural effusion. Visualization of the lung fields is limited due to respiratory motion artifact but there is bilateral patchy perihilar air space disease compatible with perihilar edema or bilateral pneumonia.  No evidence of pneumothorax.  Airways appear patent.  Mild prominence of mediastinal lymph nodes, mild prominence of mediastinal lymph nodes, likely representing reactive nodes.  No significant lymphadenopathy.  Degenerative changes in the thoracic spine.  IMPRESSION: No evidence of significant pulmonary embolus.  The cardiac enlargement with passive congestion of the liver.  Bilateral perihilar air space disease consistent with edema or pneumonia. Small right pleural effusion.  Reactive lymph nodes in the mediastinum.  Original Report Authenticated By: Marlon Pel, M.D.   Portable Chest Xray In Am  01/18/2012  *RADIOLOGY REPORT*  Clinical Data: Check endotracheal tube position.  PORTABLE CHEST - 1 VIEW  Comparison: Chest x-ray 01/17/2012.  Findings: Tip of endotracheal tube has  been retracted slightly, now just below the level of the thoracic inlet.  Nasogastric tube seen extending into the stomach (tip of tube extends below the lower margin of the image).  Lung volumes remain low.  There is extensive consolidation in  the lungs bilaterally, particularly in the left perihilar region, concerning for multilobar pneumonia.  No definite pleural effusions (right costophrenic sulcus is excluded from the image).  In the well aerated portions of the lung, there does not appear to be significant cephalization of the pulmonary vasculature.  Heart size remains moderately enlarged. The patient is rotated to the right on today's exam, resulting in distortion of the mediastinal contours and reduced diagnostic sensitivity and specificity for mediastinal pathology.  Despite this rotation, there is potential mediastinal widening, suggestive of adenopathy (likely reactive).  IMPRESSION: 1.  Support apparatus, as above. 2.  Extensive patchy bilateral but asymmetric air space consolidation redemonstrated, concerning for multilobar pneumonia. Given the lack of cephalization of pulmonary vasculature, and the asymmetry of airspace consolidation, pulmonary edema is not favored. 3.  Moderate cardiomegaly unchanged.  Original Report Authenticated By: Florencia Reasons, M.D.   Dg Chest Port 1 View  01/17/2012  *RADIOLOGY REPORT*  Clinical Data: Post intubation  PORTABLE CHEST - 1 VIEW  Comparison: 01/16/2012; chest CTA - 01/17/2012  Findings: Grossly unchanged enlarged cardiac silhouette and mediastinal contours.  Interval intubation with endotracheal tube overlying tracheal air column with tips appear the carina.  No definite pneumothorax.  Increased conspicuity of left greater than right perihilar and upper lung predominant heterogeneous airspace opacities as demonstrated on chest CT performed earlier same day. Likely unchanged small right-sided pleural effusion.  Grossly unchanged bones.  IMPRESSION: 1.  Endotracheal tube overlies tracheal air column with tip superior to the carina.  No pneumothorax. 2.  Increased conspicuity of bilateral perihilar and upper lobe predominant heterogeneous air space opacities, left greater than right.  Differential  considerations include multifocal infection and alveolar pulmonary edema.  Original Report Authenticated By: Waynard Reeds, M.D.   Dg Chest Port 1 View  01/16/2012  *RADIOLOGY REPORT*  Clinical Data: Severe shortness of breath.  PORTABLE CHEST - 1 VIEW  Comparison: None.  Findings:  The heart is mildly enlarged.  Moderate pulmonary vascular congestion is evident.  No focal airspace disease is evident.  The visualized soft tissues and bony thorax are unremarkable.  IMPRESSION: Cardiomegaly with moderate pulmonary vascular congestion.  Early congestive heart failure is not excluded.  Original Report Authenticated By: Jamesetta Orleans. MATTERN, M.D.    Cardiac Studies:  Assessment/Plan:  Status post recurrent SVT/atrial tachycardia status post EP study/ablation Decompensated systolic heart failure multifactorial i.e. tachycardia induce/EtOH/questionable recent MI Acute respiratory failure secondary to above rule out aspiration pneumonia COPD Hypertension History of tobacco abuse Morbid obesity Mild renal insufficiency Plan Continue present management Check labs Will consider left cath early next week once more stable and extubated Will add low-dose ACE inhibitors if renal function stable Dr. Algie Coffer is on call for weekend  LOS: 2 days    Norman Rodgers N 01/18/2012, 8:07 AM

## 2012-01-18 NOTE — Progress Notes (Signed)
SUBJECTIVE: The patient remains intubated.  Earlier SVT noted.    Marland Kitchen antiseptic oral rinse  15 mL Mouth Rinse Q4H  . aspirin EC  81 mg Oral Daily  . azithromycin  500 mg Intravenous Q24H  . bupivacaine      . cefTRIAXone (ROCEPHIN) IVPB 1 gram/50 mL D5W  1 g Intravenous Q24H  . chlorhexidine  15 mL Mouth/Throat Q12H  . chlorhexidine      . digoxin  0.25 mg Intravenous Daily  . digoxin  0.25 mg Intravenous STAT  . fentaNYL      . fentaNYL      . folic acid  1 mg Intravenous Daily  . furosemide  20 mg Intravenous Once  . furosemide  80 mg Intravenous Once  . furosemide  80 mg Intravenous Q6H  . heparin subcutaneous  5,000 Units Subcutaneous Q12H  . metoprolol tartrate  25 mg Oral BID  . midazolam      . midazolam      . midazolam      . midazolam      . midazolam  4 mg Intravenous Once  . mulitivitamin  5 mL Oral Daily  . pantoprazole (PROTONIX) IV  40 mg Intravenous Daily  . thiamine  100 mg Intravenous Daily  . DISCONTD: diazepam  5 mg Oral On Call  . DISCONTD: pantoprazole  40 mg Oral Q0600  . DISCONTD: rosuvastatin  20 mg Oral Daily  . DISCONTD: sodium chloride  3 mL Intravenous Q12H  . DISCONTD: vancomycin  2,000 mg Intravenous Once  . DISCONTD: verapamil  2.5 mg Intravenous Once      . sodium chloride 10 mL/hr at 01/17/12 2234  . sodium chloride 10 mL/hr at 01/17/12 2244  . sodium chloride 10 mL/hr at 01/18/12 0539  . amiodarone (NEXTERONE PREMIX) 360 mg/200 mL dextrose 60 mg/hr (01/18/12 0957)  . dexmedetomidine (PRECEDEX) IV infusion 0.2 mcg/kg/hr (01/18/12 1241)  . DISCONTD: sodium chloride 50 mL/hr at 01/17/12 0203  . DISCONTD: amiodarone (CORDARONE) infusion    . DISCONTD: amiodarone (NEXTERONE PREMIX) 360 mg/200 mL dextrose 0.5 mg/min (01/17/12 1455)  . DISCONTD: amiodarone (NEXTERONE PREMIX) 360 mg/200 mL dextrose    . DISCONTD: amiodarone (NEXTERONE PREMIX) 360 mg/200 mL dextrose    . DISCONTD: amiodarone (NEXTERONE PREMIX) 360 mg/200 mL dextrose    .  DISCONTD: fentaNYL infusion INTRAVENOUS    . DISCONTD: fentaNYL infusion INTRAVENOUS 75 mcg/hr (01/17/12 1952)  . DISCONTD: heparin 1,200 Units/hr (01/17/12 1036)  . DISCONTD: midazolam (VERSED) infusion    . DISCONTD: midazolam (VERSED) infusion 2 mg/hr (01/17/12 2230)    OBJECTIVE: Physical Exam: Filed Vitals:   01/18/12 1134 01/18/12 1200 01/18/12 1207 01/18/12 1300  BP: 94/65 102/72 102/72 104/77  Pulse: 79 73 75 70  Temp:   99.3 F (37.4 C)   TempSrc:   Oral   Resp: 20 20 21 20   Height:      Weight:      SpO2: 97% 97%  97%    Intake/Output Summary (Last 24 hours) at 01/18/12 1359 Last data filed at 01/18/12 1300  Gross per 24 hour  Intake 1708.21 ml  Output   4135 ml  Net -2426.79 ml    Telemetry reveals sinus rhythm with occasional junctional rhythm and atrial tachycardia earlier today.  GEN- The patient is sedated/ intubated   Head- normocephalic, atraumatic Oropharynx- ETT Neck- supple, Lymph- no cervical lymphadenopathy Lungs- Clear to ausculation bilaterally, normal work of breathing Heart- Regular rate and rhythm,   GI- soft,  NT, ND, + BS Extremities- no clubbing, cyanosis, or edema  LABS: Basic Metabolic Panel:  Basename 01/18/12 1057 01/17/12 0759 01/17/12 0115  NA 135 141 --  K 4.8 5.4* --  CL 102 106 --  CO2 19 14* --  GLUCOSE 156* 98 --  BUN 50* 36* --  CREATININE 1.75* 1.64* --  CALCIUM 8.6 9.5 --  MG -- -- 2.3  PHOS -- -- --   Liver Function Tests:  [facility] 01/18/12 1057 01/17/12 0115  AST 489* 70*  ALT 340* 58*  ALKPHOS 69 62  BILITOT 0.6 1.2  PROT 7.3 7.7  ALBUMIN 2.6* 2.9*   No results found for this basename: LIPASE:2,AMYLASE:2 in the last 72 hours CBC:  Basename 01/18/12 1057 01/18/12 0500 01/17/12 0115  WBC 7.9 7.9 --  NEUTROABS -- -- 6.8  HGB 11.5* 11.2* --  HCT 35.0* 34.4* --  MCV 91.6 92.0 --  PLT 174 178 --   Cardiac Enzymes:  Basename 01/18/12 1057 01/17/12 1309 01/17/12 0759  CKTOTAL 1068* 336* 309*  CKMB  16.5* 10.3* 8.0*  CKMBINDEX -- -- --  TROPONINI 1.01* <0.30 0.32*   BNP: No components found with this basename: POCBNP:3 D-Dimer:  Basename 01/17/12 0115 01/16/12 2322  DDIMER 2.25* 1.87*   Hemoglobin A1C: No results found for this basename: HGBA1C in the last 72 hours Fasting Lipid Panel:  Basename 01/17/12 0759  CHOL 134  HDL 24*  LDLCALC 96  TRIG 68  CHOLHDL 5.6  LDLDIRECT --   Thyroid Function Tests:  Basename 01/17/12 0115  TSH 0.532  T4TOTAL --  T3FREE --  THYROIDAB --    ASSESSMENT AND PLAN:  Active Problems:  SVT (supraventricular tachycardia)  CHF (congestive heart failure)  Respiratory failure  CAD (coronary artery disease)  Pneumonia  Alcohol abuse  1.  SVT- atrial tachycardia not amenable to catheter ablation yesterday.  The procedure was complicated by agitation and acute respiratory failure per Dr Ladona Ridgel. Continue IV amiodarone over the weekend.  If extubated, would switch to amiodarone 400mg  TID on Sunday Stop digoxin and metoprolol and add coreg  2. Bradycardia- occasional junctional rhythm while intubated This will likely resolve No indication for pacemaker presently  3. Newly diagnosed cardiomyopathy- possibly tachy mediated vs ETOH vs CAD Medical management per Dr Sharyn Lull  Hopefully extubated tomorrow.  EP will see as needed over the weekend. Dr Algie Coffer to cover as primary cardiology service.    Hillis Range, MD 01/18/2012 1:59 PM

## 2012-01-19 ENCOUNTER — Inpatient Hospital Stay (HOSPITAL_COMMUNITY): Payer: 59

## 2012-01-19 LAB — POCT I-STAT 3, ART BLOOD GAS (G3+)
O2 Saturation: 95 %
TCO2: 23 mmol/L (ref 0–100)
pCO2 arterial: 30.6 mmHg — ABNORMAL LOW (ref 35.0–45.0)
pO2, Arterial: 70 mmHg — ABNORMAL LOW (ref 80.0–100.0)

## 2012-01-19 LAB — CBC
MCH: 29.4 pg (ref 26.0–34.0)
MCHC: 32.2 g/dL (ref 30.0–36.0)
Platelets: 129 10*3/uL — ABNORMAL LOW (ref 150–400)
RBC: 3.67 MIL/uL — ABNORMAL LOW (ref 4.22–5.81)

## 2012-01-19 MED ORDER — MIDAZOLAM BOLUS VIA INFUSION
1.0000 mg | INTRAVENOUS | Status: DC | PRN
  Filled 2012-01-19: qty 2

## 2012-01-19 MED ORDER — FENTANYL BOLUS VIA INFUSION
50.0000 ug | Freq: Four times a day (QID) | INTRAVENOUS | Status: DC | PRN
  Filled 2012-01-19: qty 100

## 2012-01-19 MED ORDER — NALOXONE HCL 1 MG/ML IJ SOLN
1.0000 mg | INTRAMUSCULAR | Status: DC | PRN
  Filled 2012-01-19: qty 2

## 2012-01-19 MED ORDER — SODIUM CHLORIDE 0.9 % IV SOLN
0.2000 ug/kg/h | INTRAVENOUS | Status: DC
  Administered 2012-01-19: 0.8 ug/kg/h via INTRAVENOUS
  Filled 2012-01-19 (×2): qty 4

## 2012-01-19 MED ORDER — NALOXONE HCL 0.4 MG/ML IJ SOLN
INTRAMUSCULAR | Status: AC
  Filled 2012-01-19: qty 1

## 2012-01-19 MED ORDER — SODIUM CHLORIDE 0.9 % IV SOLN
2.0000 mg/h | INTRAVENOUS | Status: DC
  Administered 2012-01-19: 2 mg/h via INTRAVENOUS
  Filled 2012-01-19: qty 10

## 2012-01-19 MED ORDER — NALOXONE HCL 0.4 MG/ML IJ SOLN
INTRAMUSCULAR | Status: AC
  Administered 2012-01-19: 0.4 mg
  Filled 2012-01-19: qty 1

## 2012-01-19 MED ORDER — SODIUM CHLORIDE 0.9 % IV SOLN
50.0000 ug/h | INTRAVENOUS | Status: DC
  Administered 2012-01-19: 50 ug/h via INTRAVENOUS
  Filled 2012-01-19: qty 50

## 2012-01-19 MED ORDER — NALOXONE HCL 0.4 MG/ML IJ SOLN
0.4000 mg | Freq: Once | INTRAMUSCULAR | Status: AC
  Administered 2012-01-19: 0.4 mg via INTRAVENOUS

## 2012-01-19 NOTE — Procedures (Signed)
Extubation Procedure Note  Patient Details:   Name: Norman Rodgers DOB: [birth year] MRN: [id number]   Airway Documentation:    Pt. Self extubated Evaluation  O2 sats: stable throughout Complications: No apparent complications Patient did tolerate procedure well. Bilateral Breath Sounds: Diminished;Rhonchi Suctioning: Airway Yes, pt not coughing at this time, BBS Rhonchi, able to suction back of throat.  Placed on 6L [place], spo2 96%, pt able to speak.  HR 67, RR 23.  RT will monitor.  ABG ordered for 30 min.  Gerome Apley 01/19/2012, 6:45 PM

## 2012-01-19 NOTE — Progress Notes (Signed)
At approximately 1845 rn stepped out of room,pt was sedated on 3versed and 75 fentanyl iv. After 2 minutes rn heard vent alarm, ran to room and pt had extubated self. RT paged and at bedside within 2 minutes.Rn had placed on nasal cannula and elink called. Orders received to administer narcan. Pt now on 6 liters nasal cannula, sats 98%.able to tell me his name. Speech clear, hoarse sounding voice,pt trying to clear throat.

## 2012-01-19 NOTE — Progress Notes (Signed)
Subjective:  Intubated and mildly sedated. Slow weaning from Oxygen. Frequent APCs.  Objective:  Vital Signs in the last 24 hours: Temp:  [99.1 F (37.3 C)-99.4 F (37.4 C)] 99.4 F (37.4 C) (02/15 2337) Pulse Rate:  [35-103] 59  (02/16 1000) Cardiac Rhythm:  [-]  Resp:  [2-28] 21  (02/16 1000) BP: (83-117)/(39-87) 90/69 mmHg (02/16 1000) SpO2:  [93 %-99 %] 99 % (02/16 1000) FiO2 (%):  [40 %] 40 % (02/16 0725) Weight:  [112 kg (246 lb 14.6 oz)] 112 kg (246 lb 14.6 oz) (02/16 0725)  Physical Exam: BP Readings from Last 1 Encounters:  01/19/12 90/69    Wt Readings from Last 1 Encounters:  01/19/12 112 kg (246 lb 14.6 oz)    Weight change:   HEENT: [place]/AT, Eyes-Brown, Conjunctiva-Pale pink, Sclera-Non-icteric. ETT in place. Neck: No JVD, No bruit, Trachea midline. Lungs:  Clear, Bilateral. Cardiac: Irregular rhythm, normal S1 and S2, no S3.  Abdomen:  Soft, non-tender. Extremities:  Trace edema present. No cyanosis. No clubbing. CNS: Sedated. Skin: Warm and dry.   Intake/Output from previous day: 02/15 0701 - 02/16 0700 In: 1866.2 [P.O.:110; I.V.:1446.2; IV Piggyback:310] Out: 1935 [Urine:1935]    Lab Results: BMET    Component Value Date/Time   NA 135 01/18/2012 1057   K 4.8 01/18/2012 1057   CL 102 01/18/2012 1057   CO2 19 01/18/2012 1057   GLUCOSE 156* 01/18/2012 1057   BUN 50* 01/18/2012 1057   CREATININE 1.75* 01/18/2012 1057   CALCIUM 8.6 01/18/2012 1057   GFRNONAA 39* 01/18/2012 1057   GFRAA 46* 01/18/2012 1057   CBC    Component Value Date/Time   WBC 8.4 01/19/2012 0600   RBC 3.67* 01/19/2012 0600   HGB 10.8* 01/19/2012 0600   HCT 33.5* 01/19/2012 0600   PLT 129* 01/19/2012 0600   MCV 91.3 01/19/2012 0600   MCH 29.4 01/19/2012 0600   MCHC 32.2 01/19/2012 0600   RDW 15.7* 01/19/2012 0600   LYMPHSABS 2.2 01/17/2012 0115   MONOABS 0.6 01/17/2012 0115   EOSABS 0.0 01/17/2012 0115   BASOSABS 0.0 01/17/2012 0115   CARDIAC ENZYMES Lab Results  Component Value Date   CKTOTAL 1068* 01/18/2012   CKMB 16.5* 01/18/2012   TROPONINI 1.01* 01/18/2012    Assessment/Plan:  Patient Active Hospital Problem List:  Status post recurrent SVT/atrial tachycardia status post EP study/ablation  Decompensated systolic heart failure multifactorial i.e. tachycardia induce/EtOH/questionable recent MI  Acute respiratory failure secondary to above rule out aspiration pneumonia  COPD  Hypertension  History of tobacco abuse  Morbid obesity  Mild renal insufficiency  Plan: Continue medical treatment   LOS: 3 days    Orpah Cobb  MD  01/19/2012, 10:18 AM

## 2012-01-19 NOTE — Progress Notes (Signed)
Name: Norman Rodgers MRN: [id number] DOB: 29-Oct-1946  LOS: 3  PCCM Progress Note   History of Present Illness: 66 y/o male with no past medical history presented to [facility] ED on 2/13 with several days of dyspnea and malaise.  He was found to be in SVT and likely volume overload and was admitted to cardiology for further management.  He underwent an RFA today for recurrent SVT atrial tachycardia and was somnolent post procedure so was electively intubated.  PCCM consulted for further management.  Lines / Drains: 2/14 ETT >>  [facility] Protonix Heprin sq  Cultures / Sepsis markers: 2/14 blood >> 2/14 sputum >> 2/14 urine st/leg ag >> NEG  Results for orders placed during the hospital encounter of 01/16/12  MRSA PCR SCREENING     Status: Normal   Collection Time   01/17/12  1:14 AM      Component Value Range Status Comment   MRSA by PCR NEGATIVE  NEGATIVE  Final      Antibiotics: 2/14 Ceft (pneumonia?) >> 2/14 azithro (pneumonia?) >> 2/14 vanc (pneumonia?) >> 2/14  Anti-infectives     Start     Dose/Rate Route Frequency Ordered Stop   01/17/12 2000   vancomycin (VANCOCIN) 2,000 mg in sodium chloride 0.9 % 500 mL IVPB  Status:  Discontinued        2,000 mg 250 mL/hr over 120 Minutes Intravenous  Once 01/17/12 1824 01/17/12 2007   01/17/12 0745   cefTRIAXone (ROCEPHIN) 1 g in dextrose 5 % 50 mL IVPB     Comments: First dose stat      1 g 100 mL/hr over 30 Minutes Intravenous Every 24 hours 01/17/12 0741     01/17/12 0745   azithromycin (ZITHROMAX) 500 mg in dextrose 5 % 250 mL IVPB        500 mg 250 mL/hr over 60 Minutes Intravenous Every 24 hours 01/17/12 0741             Tests / Events: 2/14 EP study, ablation for SVT  2/14 TTE: global hypokinesis of LV, LVEF 20% 2/14 CT angio>NEG PE , Bil. aspdz +/-PNA/edema     SUBJECTIVE/OVERNIGHT?INTERVAL HX  Increased resp distress, not tolerating weaning.  Agitated  Vital Signs:   Filed Vitals:   01/19/12 0500  01/19/12 0600 01/19/12 0700 01/19/12 0725  BP: 83/53 113/63 102/57 102/57  Pulse: 35 58 49 49  Temp:      TempSrc:      Resp: 23 22 2 27   Height:      Weight:    112 kg (246 lb 14.6 oz)  SpO2: 98% 99% 97% 97%    Physical Examination: Gen: sedated on vent HEENT: NCAT, PERRL, EOMi, OP clear, ETT in place Neck: supple without masses PULM Coarse BS w/ scattered rhonchi  CV: RRR, cannot appreciate S3, elevated JVP noted AB: BS+, soft, nontender, no hsm Ext: warm, tr-1+ edema, no clubbing, no cyanosis Derm: no rash or skin breakdown Neuro: agitated  on vent, Psyche: cannot assess  Labs and Imaging:    CBC    Component Value Date/Time   WBC 8.4 01/19/2012 0600   RBC 3.67* 01/19/2012 0600   HGB 10.8* 01/19/2012 0600   HCT 33.5* 01/19/2012 0600   PLT 129* 01/19/2012 0600   MCV 91.3 01/19/2012 0600   MCH 29.4 01/19/2012 0600   MCHC 32.2 01/19/2012 0600   RDW 15.7* 01/19/2012 0600   LYMPHSABS 2.2 01/17/2012 0115   MONOABS 0.6 01/17/2012 0115  EOSABS 0.0 01/17/2012 0115   BASOSABS 0.0 01/17/2012 0115    BMET    Component Value Date/Time   NA 135 01/18/2012 1057   K 4.8 01/18/2012 1057   CL 102 01/18/2012 1057   CO2 19 01/18/2012 1057   GLUCOSE 156* 01/18/2012 1057   BUN 50* 01/18/2012 1057   CREATININE 1.75* 01/18/2012 1057   CALCIUM 8.6 01/18/2012 1057   GFRNONAA 39* 01/18/2012 1057   GFRAA 46* 01/18/2012 1057     Lab 01/19/12 0600 01/18/12 1057 01/18/12 0500  HGB 10.8* 11.5* 11.2*  HCT 33.5* 35.0* 34.4*  WBC 8.4 7.9 7.9  PLT 129* 174 178     Lab 01/18/12 1057 01/17/12 0759 01/17/12 0115 01/16/12 2230  NA 135 141 136 142  K 4.8 5.4* -- --  CL 102 106 104 109  CO2 19 14* 15* --  GLUCOSE 156* 98 108* 125*  BUN 50* 36* 29* 29*  CREATININE 1.75* 1.64* 1.56* 1.40*  CALCIUM 8.6 9.5 8.9 --  MG -- -- 2.3 --  PHOS -- -- -- --     Lab 01/17/12 2038 01/16/12 2230  PHART 7.323* --  PCO2ART 38.7 --  PO2ART 85.5 --  HCO3 19.5* --  TCO2 20.7 19  O2SAT 94.6 --   No results  found for this basename: PROCALCITON:5 in the last 168 hours     Lab 01/18/12 1057 01/17/12 1309 01/17/12 0759 01/17/12 0116  TROPONINI 1.01* <0.30 0.32* <0.30   No results found for this basename: PROBNP:5 in the last 168 hours Portable Chest Xray In Am  01/18/2012  *RADIOLOGY REPORT*  Clinical Data: Check endotracheal tube position.  PORTABLE CHEST - 1 VIEW  Comparison: Chest x-ray 01/17/2012.  Findings: Tip of endotracheal tube has been retracted slightly, now just below the level of the thoracic inlet.  Nasogastric tube seen extending into the stomach (tip of tube extends below the lower margin of the image).  Lung volumes remain low.  There is extensive consolidation in the lungs bilaterally, particularly in the left perihilar region, concerning for multilobar pneumonia.  No definite pleural effusions (right costophrenic sulcus is excluded from the image).  In the well aerated portions of the lung, there does not appear to be significant cephalization of the pulmonary vasculature.  Heart size remains moderately enlarged. The patient is rotated to the right on today's exam, resulting in distortion of the mediastinal contours and reduced diagnostic sensitivity and specificity for mediastinal pathology.  Despite this rotation, there is potential mediastinal widening, suggestive of adenopathy (likely reactive).  IMPRESSION: 1.  Support apparatus, as above. 2.  Extensive patchy bilateral but asymmetric air space consolidation redemonstrated, concerning for multilobar pneumonia. Given the lack of cephalization of pulmonary vasculature, and the asymmetry of airspace consolidation, pulmonary edema is not favored. 3.  Moderate cardiomegaly unchanged.  Original Report Authenticated By: Florencia Reasons, M.D.   Dg Chest Port 1 View  01/17/2012  *RADIOLOGY REPORT*  Clinical Data: Post intubation  PORTABLE CHEST - 1 VIEW  Comparison: 01/16/2012; chest CTA - 01/17/2012  Findings: Grossly unchanged enlarged  cardiac silhouette and mediastinal contours.  Interval intubation with endotracheal tube overlying tracheal air column with tips appear the carina.  No definite pneumothorax.  Increased conspicuity of left greater than right perihilar and upper lung predominant heterogeneous airspace opacities as demonstrated on chest CT performed earlier same day. Likely unchanged small right-sided pleural effusion.  Grossly unchanged bones.  IMPRESSION: 1.  Endotracheal tube overlies tracheal air column with tip superior to  the carina.  No pneumothorax. 2.  Increased conspicuity of bilateral perihilar and upper lobe predominant heterogeneous air space opacities, left greater than right.  Differential considerations include multifocal infection and alveolar pulmonary edema.  Original Report Authenticated By: Waynard Reeds, M.D.      Assessment and Plan:  This is a 66 y/o male with no past medical history but a significant alcohol use history presented on 2/13 with respiratory distress and SVT.  Intubated for respiratory insufficiency after EP study/ablation today.  Appears to be grossly volume overloaded.    Acute Respiratory failure (01/17/2012)   Assessment: due to volume overload +/- pneumonia.  2/16>not tolerating weaning    Plan:  -check xray , consider more diuresis -goal neg bal.  -full vent support -follow cx datat -cont abx    SVT (supraventricular tachycardia) (01/16/2012)   Assessment: s/p failed ablation on 2/14 w/ Bump in card enzymes   Plan:  -Cont amio, coreg -per cardiology  CHF (congestive heart failure) (01/17/2012)   Assessment: due to alcohol abuse vs. SVT?  CM on echo w/ low EF  2/16: neg I/O bal.     Plan:  -keep in negative fluid balance -per card   CAD (coronary artery disease) (01/17/2012)   Assessment: question of old ant MI.    Plan:  -per cardiology  Pneumonia (01/17/2012)   Plan:  -continue ceftriaxone and azithro for now -f/u cultures/biomarkers  Alcohol  abuse/Agitation/Delirium/Enceophalopathy (01/17/2012)   Assessment:  Significant agitation  On 01/18/12   Plan:  -daily thiamine/folate/mvi supplementation -cont  precedex (started 01/18/12)  -prn versed, fentanyl   Best practices / Disposition: PCCM consulting, Dr. Sharyn Lull primary physician  Feeding/protein malnutrition:  Analgesia: fentanyl gtt Sedation: versed prn Thromboprophylaxis: heparin HOB >30 degrees Ulcer prophylaxis: ppi  Glucose control/hyperglycemia: monitor cbg Family : wife updated 01/18/12   [facility] NP -C [facility] /Critical Care  [phone]  01/19/2012 8:13 AM   Reviewed above, examined pt and agree with assessment/plan.  Resp mechanics improved, but still has edema pattern on CXR.  Very agitated with WUA.  Will continue sedation and full vent support for now.  Updated family at bedside.  Critical care time 30 minutes.  Coralyn Helling, MD 01/19/2012, 1:08 PM Pager:  [phone]

## 2012-01-19 NOTE — Significant Event (Signed)
Pt continues to be agitated, requiring frequent boluses of versed, in spite of high dose of precedex.  Will d/c precedex and change to versed gtt.  Coralyn Helling, MD 01/19/2012, 1:17 PM Pager:  [phone]

## 2012-01-20 ENCOUNTER — Inpatient Hospital Stay (HOSPITAL_COMMUNITY): Payer: 59

## 2012-01-20 DIAGNOSIS — F1010 Alcohol abuse, uncomplicated: Secondary | ICD-10-CM

## 2012-01-20 DIAGNOSIS — J9600 Acute respiratory failure, unspecified whether with hypoxia or hypercapnia: Secondary | ICD-10-CM

## 2012-01-20 DIAGNOSIS — I509 Heart failure, unspecified: Secondary | ICD-10-CM

## 2012-01-20 DIAGNOSIS — G9340 Encephalopathy, unspecified: Secondary | ICD-10-CM

## 2012-01-20 LAB — LEGIONELLA ANTIGEN, URINE: Legionella Antigen, Urine: NEGATIVE

## 2012-01-20 LAB — GLUCOSE, CAPILLARY
Glucose-Capillary: 123 mg/dL — ABNORMAL HIGH (ref 70–99)
Glucose-Capillary: 154 mg/dL — ABNORMAL HIGH (ref 70–99)

## 2012-01-20 LAB — CBC
HCT: 36 % — ABNORMAL LOW (ref 39.0–52.0)
MCV: 91.8 fL (ref 78.0–100.0)
RBC: 3.92 MIL/uL — ABNORMAL LOW (ref 4.22–5.81)
WBC: 14.3 10*3/uL — ABNORMAL HIGH (ref 4.0–10.5)

## 2012-01-20 LAB — BASIC METABOLIC PANEL
BUN: 38 mg/dL — ABNORMAL HIGH (ref 6–23)
CO2: 23 mEq/L (ref 19–32)
Chloride: 108 mEq/L (ref 96–112)
Creatinine, Ser: 1.39 mg/dL — ABNORMAL HIGH (ref 0.50–1.35)

## 2012-01-20 MED ORDER — DEXTROSE-NACL 5-0.45 % IV SOLN
INTRAVENOUS | Status: AC
  Administered 2012-01-20 – 2012-01-21 (×2): via INTRAVENOUS

## 2012-01-20 MED ORDER — FUROSEMIDE 10 MG/ML IJ SOLN
20.0000 mg | Freq: Once | INTRAMUSCULAR | Status: DC
  Administered 2012-01-20: 20 mg via INTRAVENOUS
  Filled 2012-01-20: qty 2

## 2012-01-20 MED ORDER — DILTIAZEM HCL 60 MG PO TABS
60.0000 mg | ORAL_TABLET | Freq: Four times a day (QID) | ORAL | Status: DC
  Administered 2012-01-20 – 2012-01-21 (×4): 60 mg via ORAL
  Filled 2012-01-20 (×8): qty 1

## 2012-01-20 MED ORDER — CARVEDILOL 25 MG PO TABS
25.0000 mg | ORAL_TABLET | Freq: Two times a day (BID) | ORAL | Status: DC
  Administered 2012-01-20 – 2012-01-22 (×4): 25 mg via ORAL
  Filled 2012-01-20 (×6): qty 1

## 2012-01-20 MED ORDER — LORAZEPAM 1 MG PO TABS
1.0000 mg | ORAL_TABLET | Freq: Three times a day (TID) | ORAL | Status: DC
  Administered 2012-01-20 – 2012-01-22 (×7): 1 mg via ORAL
  Filled 2012-01-20 (×8): qty 1

## 2012-01-20 NOTE — Progress Notes (Signed)
Subjective:  Self extubated. H/O alcohol abuse. Alcoholic cardiomyopathy and SVT.  Objective:  Vital Signs in the last 24 hours: Temp:  [98.1 F (36.7 C)-98.7 F (37.1 C)] 98.6 F (37 C) (02/17 0414) Pulse Rate:  [41-116] 113  (02/17 0900) Cardiac Rhythm:  [-] Normal sinus rhythm (02/16 2000) Resp:  [0-35] 32  (02/17 0900) BP: (90-173)/(62-100) 153/89 mmHg (02/17 0900) SpO2:  [87 %-100 %] 93 % (02/17 0900) FiO2 (%):  [40 %] 40 % (02/16 1538) Weight:  [108.6 kg (239 lb 6.7 oz)] 108.6 kg (239 lb 6.7 oz) (02/17 0442)  Physical Exam: BP Readings from Last 1 Encounters:  01/20/12 153/89    Wt Readings from Last 1 Encounters:  01/20/12 108.6 kg (239 lb 6.7 oz)    Weight change:   HEENT: [place]/AT, Eyes-Brown, PERL, EOMI, Conjunctiva-Pale pink, Sclera-Non-icteric Neck: No JVD, No bruit, Trachea midline. Lungs:  Clear, Bilateral. Cardiac:  Regular rhythm, normal S1 and S2, no S3.  Abdomen:  Soft, non-tender. Extremities:  No edema present. No cyanosis. No clubbing. CNS: AxOx3, Cranial nerves grossly intact, moves all 4 extremities. Right handed. Skin: Warm and dry. Thick indurated skin of right axilla and right groin with pus drainage.   Intake/Output from previous day: 02/16 0701 - 02/17 0700 In: 1380.1 [I.V.:820.1; IV Piggyback:310] Out: 2700 [Urine:2700]    Lab Results: BMET    Component Value Date/Time   NA 142 01/20/2012 0557   K 3.9 01/20/2012 0557   CL 108 01/20/2012 0557   CO2 23 01/20/2012 0557   GLUCOSE 152* 01/20/2012 0557   BUN 38* 01/20/2012 0557   CREATININE 1.39* 01/20/2012 0557   CALCIUM 8.9 01/20/2012 0557   GFRNONAA 52* 01/20/2012 0557   GFRAA 60* 01/20/2012 0557   CBC    Component Value Date/Time   WBC 14.3* 01/20/2012 0557   RBC 3.92* 01/20/2012 0557   HGB 11.9* 01/20/2012 0557   HCT 36.0* 01/20/2012 0557   PLT 122* 01/20/2012 0557   MCV 91.8 01/20/2012 0557   MCH 30.4 01/20/2012 0557   MCHC 33.1 01/20/2012 0557   RDW 16.0* 01/20/2012 0557   LYMPHSABS 2.2  01/17/2012 0115   MONOABS 0.6 01/17/2012 0115   EOSABS 0.0 01/17/2012 0115   BASOSABS 0.0 01/17/2012 0115   CARDIAC ENZYMES Lab Results  Component Value Date   CKTOTAL 1068* 01/18/2012   CKMB 16.5* 01/18/2012   TROPONINI 1.01* 01/18/2012    Assessment/Plan:  Patient Active Hospital Problem List:  Status post recurrent SVT/atrial tachycardia status post attempted EP study/ablation  Decompensated systolic heart failure multifactorial i.e. tachycardia induce/ETOH/questionable recent MI  Acute respiratory failure secondary to above rule out aspiration pneumonia- improving  COPD  Hypertension  History of tobacco abuse  Morbid obesity  Mild renal insufficiency   Plan:  Continue medical treatment. Add Lorazepam qid/ Change amiodarone to B-blocker and cardizem due to liver dysfunction. Culture chronic wound with drainage from right groin and axilla.    LOS: 4 days    Orpah Cobb  MD  01/20/2012, 9:59 AM

## 2012-01-20 NOTE — Progress Notes (Signed)
eLink Physician-Brief Progress Note Patient Name: Norman Rodgers DOB: 1946/05/01 MRN: [id number]  Date of Service  01/20/2012   HPI/Events of Note    Nurse called about amiodarone drip. Pt had lost IV access but has a new access now. Amiodarone running. HR in 130's. Pt self extubated earlier in the day. Looks calm.   eICU Interventions    No intervention required. Nurse to inform E-link if any change in clinical status.      Catha Brow 01/20/2012, 12:04 AM

## 2012-01-20 NOTE — Evaluation (Signed)
Clinical/Bedside Swallow Evaluation Patient Details  Name: Norman Rodgers MRN: [id number] DOB: 1946/05/27 Today's Date: 01/20/2012  HPI:  66 y/o male with no past medical history presented to [facility] ED on 2/13 with several days of dyspnea and malaise.  He was found to be in SVT and likely volume overload, underwent an RFA for recurrent SVT atrial tachycardia and was somnolent post procedure so was electively intubated.  ETT 2/14-2/16 due to self-extubation.  Chest x-ray on 2/17 showed bibasilar airspace disease. Bedside swallow evaluation ordered.   Assessment/Recommendations/Treatment Plan   SLP Assessment Clinical Impression Statement: Demonstrates clinical indicators of a suspected pharyngeal phase swallow impairment given multiple swallows and immediate throat clearing or coughing following all swallows of puree and solids.  Thin liquid trials were [facility] even with large, sequential swallows via straw.  Given this clinical finding, suspect a possible structural pharyngeal phase dysphagia (e.g., tight UES) as wife reports "he did this kind of thing a week or so before he came in here."  An objective swallow assessment is warranted to determine nature of these findings and r/o silent aspiration risk with thin liquids. Risk for Aspiration: Mild  Swallow Evaluation Recommendations Recommended Consults: MBS Solid Consistency: No solids, see liquids Liquid Consistency: Thin Liquid Administration via: Cup;Straw Medication Administration: Whole meds with liquid Supervision: Patient able to self feed;Intermittent supervision to cue for compensatory strategies Compensations: Slow rate;Small sips/bites Postural Changes and/or Swallow Maneuvers: Seated upright 90 degrees;Upright 30-60 min after meal Oral Care Recommendations: Oral care QID;Staff/trained caregiver to provide oral care;Oral care before and after PO Follow up Recommendations: Other (comment) (TBD)  Treatment Plan Treatment Plan  Recommendations: F/U MBS in ___ days (Comment) (2/18)  Prognosis Prognosis for Safe Diet Advancement: Good  Individuals Consulted Consulted and Agree with Results and Recommendations: Family member/caregiver Family Member Consulted: Wife  Swallow Evaluation  General  Date of Onset: 01/17/12 Type of Study: Bedside swallow evaluation Diet Prior to this Study: NPO;IV Temperature Spikes Noted: No Respiratory Status: Supplemental O2 delivered via (comment) (4 liters) History of Intubation: Yes Length of Intubations (days): 2 days Date extubated: 01/19/12 (self-extubated) Behavior/Cognition: Alert;Cooperative;Impulsive Oral Cavity - Dentition: Dentures, top;Dentures, bottom (Requires denture adhesive with top denture PTA.) Vision: Functional for self-feeding Patient Positioning: Upright in bed Baseline Vocal Quality: Clear Volitional Cough: Strong Volitional Swallow: Able to elicit  Oral Motor/Sensory Function  Overall Oral Motor/Sensory Function: Appears within functional limits for tasks assessed  Consistency Results  Ice Chips Ice chips: Not tested  Thin Liquid Thin Liquid: Within functional limits Presentation: Cup;Self Fed;Straw  Nectar Thick Liquid Nectar Thick Liquid: Not tested  Honey Thick Liquid Honey Thick Liquid: Not tested  Puree Puree: Impaired Presentation: Self Fed;Spoon Pharyngeal Phase Impairments: Throat Clearing - Immediate;Cough - Immediate;Multiple swallows  Solid Solid: Impaired Presentation: Self Fed Pharyngeal Phase Impairments: Throat Clearing - Immediate;Cough - Immediate;Multiple swallows   Myra Rude, M.S.,CCC-SLP Pager [phone]  01/20/2012,11:33 AM

## 2012-01-20 NOTE — Progress Notes (Signed)
Placed pt. On BIPAP 10/5, 60%. Pt. Immediately requested that BIPAP be taken off & said that he didn't want to wear it. MD was made aware. Pt. Was placed back on O2 via [place]. ABG was obtained. All values were within normal limits.

## 2012-01-20 NOTE — Progress Notes (Signed)
Name: Norman Rodgers MRN: [id number] DOB: 11/25/1946  LOS: 4  PCCM Progress Note   History of Present Illness: 66 y/o male with no past medical history presented to [facility] ED on 2/13 with several days of dyspnea and malaise.  He was found to be in SVT and likely volume overload and was admitted to cardiology for further management.  He underwent an RFA today for recurrent SVT atrial tachycardia and was somnolent post procedure so was electively intubated.  PCCM consulted for further management.  Lines / Drains: 2/14 ETT >>2/16 (self extubated)  [facility] Protonix Heprin sq  Cultures / Sepsis markers: 2/14 blood >> 2/14 sputum >> 2/14 urine st/leg ag >> NEG  Results for orders placed during the hospital encounter of 01/16/12  MRSA PCR SCREENING     Status: Normal   Collection Time   01/17/12  1:14 AM      Component Value Range Status Comment   MRSA by PCR NEGATIVE  NEGATIVE  Final   CULTURE, BLOOD (ROUTINE X 2)     Status: Normal (Preliminary result)   Collection Time   01/17/12  8:35 PM      Component Value Range Status Comment   Specimen Description BLOOD LEFT HAND   Final    Special Requests BOTTLES DRAWN AEROBIC AND ANAEROBIC 10CC   Final    Culture  Setup Time [id number]   Final    Culture     Final    Value:        BLOOD CULTURE RECEIVED NO GROWTH TO DATE CULTURE WILL BE HELD FOR 5 DAYS BEFORE ISSUING A FINAL NEGATIVE REPORT   Report Status PENDING   Incomplete   CULTURE, BLOOD (ROUTINE X 2)     Status: Normal (Preliminary result)   Collection Time   01/17/12  8:36 PM      Component Value Range Status Comment   Specimen Description BLOOD RIGHT HAND   Final    Special Requests BOTTLES DRAWN AEROBIC AND ANAEROBIC 5CC   Final    Culture  Setup Time [id number]   Final    Culture     Final    Value:        BLOOD CULTURE RECEIVED NO GROWTH TO DATE CULTURE WILL BE HELD FOR 5 DAYS BEFORE ISSUING A FINAL NEGATIVE REPORT   Report Status PENDING   Incomplete       Antibiotics: 2/14 Ceft (pneumonia?) >> 2/14 azithro (pneumonia?) >> 2/14 vanc (pneumonia?) >> 2/14  Anti-infectives     Start     Dose/Rate Route Frequency Ordered Stop   01/17/12 2000   vancomycin (VANCOCIN) 2,000 mg in sodium chloride 0.9 % 500 mL IVPB  Status:  Discontinued        2,000 mg 250 mL/hr over 120 Minutes Intravenous  Once 01/17/12 1824 01/17/12 2007   01/17/12 0745   cefTRIAXone (ROCEPHIN) 1 g in dextrose 5 % 50 mL IVPB     Comments: First dose stat      1 g 100 mL/hr over 30 Minutes Intravenous Every 24 hours 01/17/12 0741     01/17/12 0745   azithromycin (ZITHROMAX) 500 mg in dextrose 5 % 250 mL IVPB        500 mg 250 mL/hr over 60 Minutes Intravenous Every 24 hours 01/17/12 0741             Tests / Events: 2/14 EP study, ablation for SVT  2/14 TTE: global hypokinesis of LV, LVEF 20% 2/14 CT  angio>NEG PE , Bil. aspdz +/-PNA/edema     SUBJECTIVE/OVERNIGHT?INTERVAL HX  Self extubated last night, Sats adequate on [place] O2 Cont w/ episodes of SVT  B/P tr up  Right groin boil +foul odor drainage   Vital Signs:   Filed Vitals:   01/20/12 0414 01/20/12 0442 01/20/12 0500 01/20/12 0600  BP:   141/100 145/97  Pulse:  73 116 116  Temp: 98.6 F (37 C)     TempSrc: Oral     Resp:  30 28 27   Height:      Weight:  108.6 kg (239 lb 6.7 oz)    SpO2:  93% 94% 93%    Physical Examination: Gen: chronically ill appearing, NAD  HEENT: NCAT, PERRL, EOMi, OP clear Neck: supple without masses PULM Coarse BS w/ scattered rhonchi  CV: RRR, cannot appreciate S3, elevated JVP noted AB: BS+, soft, nontender, no hsm Ext: warm, tr-1+ edema, no clubbing, no cyanosis Derm: along right inner groin , tender boil ,+post purulent drainage, foul odor  Neuro: alert x 2, no focal deficits noted  Psyche: flat affect ,   Labs and Imaging:    CBC    Component Value Date/Time   WBC 14.3* 01/20/2012 0557   RBC 3.92* 01/20/2012 0557   HGB 11.9* 01/20/2012 0557   HCT 36.0*  01/20/2012 0557   PLT 122* 01/20/2012 0557   MCV 91.8 01/20/2012 0557   MCH 30.4 01/20/2012 0557   MCHC 33.1 01/20/2012 0557   RDW 16.0* 01/20/2012 0557   LYMPHSABS 2.2 01/17/2012 0115   MONOABS 0.6 01/17/2012 0115   EOSABS 0.0 01/17/2012 0115   BASOSABS 0.0 01/17/2012 0115    BMET    Component Value Date/Time   NA 142 01/20/2012 0557   K 3.9 01/20/2012 0557   CL 108 01/20/2012 0557   CO2 23 01/20/2012 0557   GLUCOSE 152* 01/20/2012 0557   BUN 38* 01/20/2012 0557   CREATININE 1.39* 01/20/2012 0557   CALCIUM 8.9 01/20/2012 0557   GFRNONAA 52* 01/20/2012 0557   GFRAA 60* 01/20/2012 0557     Lab 01/20/12 0557 01/19/12 0600 01/18/12 1057  HGB 11.9* 10.8* 11.5*  HCT 36.0* 33.5* 35.0*  WBC 14.3* 8.4 7.9  PLT 122* 129* 174     Lab 01/20/12 0557 01/18/12 1057 01/17/12 0759 01/17/12 0115 01/16/12 2230  NA 142 135 141 136 142  K 3.9 4.8 -- -- --  CL 108 102 106 104 109  CO2 23 19 14* 15* --  GLUCOSE 152* 156* 98 108* 125*  BUN 38* 50* 36* 29* 29*  CREATININE 1.39* 1.75* 1.64* 1.56* 1.40*  CALCIUM 8.9 8.6 9.5 8.9 --  MG -- -- -- 2.3 --  PHOS -- -- -- -- --     Lab 01/19/12 2141 01/17/12 2038 01/16/12 2230  PHART 7.461* 7.323* --  PCO2ART 30.6* 38.7 --  PO2ART 70.0* 85.5 --  HCO3 21.8 19.5* --  TCO2 23 20.7 19  O2SAT 95.0 94.6 --   No results found for this basename: PROCALCITON:5 in the last 168 hours     Lab 01/18/12 1057 01/17/12 1309 01/17/12 0759 01/17/12 0116  TROPONINI 1.01* <0.30 0.32* <0.30   No results found for this basename: PROBNP:5 in the last 168 hours Dg Chest Port 1v Same Day  01/19/2012  *RADIOLOGY REPORT*  Clinical Data: Reintubated the  PORTABLE CHEST - 1 VIEW SAME DAY  Comparison:   the previous day's study  Findings: Endotracheal tube tip 6 cm above carina.  Nasogastric  tube remains in place.  Mild cardiomegaly.  Mild perihilar and bibasilar interstitial airspace opacities, left greater than right, improved since previous exam.  No definite effusion.   IMPRESSION:  1.  Endotracheal tube in good position. 2.  Partial improvement in bilateral infiltrates or edema.  Original Report Authenticated By: Osa Craver, M.D.      Assessment and Plan:  This is a 66 y/o male with no past medical history but a significant alcohol use history presented on 2/13 with respiratory distress and SVT.  Intubated for respiratory insufficiency after EP study/ablation with what appeared to  be grossly volume overload   Acute Respiratory failure (01/17/2012)   Assessment: due to volume overload +/- pneumonia.  2/17>self extubated 2/16 , sats adequate , xray w/ no sign change in bilateral aspdz     Plan:  -check xray in am  ,  -gentle diuresis with Lasix 20mg  IV x 1  -goal neg I/O bal.  -- cx data -ngtd  -cont abx  -IS    SVT (supraventricular tachycardia) (01/16/2012)   Assessment: s/p failed ablation on 2/14 w/ Bump in card enzymes   Plan:  -Cont amio, coreg -per cardiology  CHF (congestive heart failure) (01/17/2012)   Assessment: due to alcohol abuse vs. SVT?  CM on echo w/ low EF  2/17:remains neg I/O bal.     Plan:  -goal  negative fluid balance-no diuresis today  -per cards   CAD (coronary artery disease) (01/17/2012)   Assessment: question of old ant MI.    Plan:  -per cardiology  Pneumonia (01/17/2012)   Plan:  -continue ceftriaxone and azithro for now -f/u cultures  Alcohol abuse/Agitation/Delirium/Enceophalopathy (01/17/2012)   Assessment:    2/17 : improved, no sign of w/dr currently     Plan:  -daily thiamine/folate/mvi supplementation  -check swallow eval -if passes begin diet as tolerated  -mobilize pt   Elevated LFTs ? etoh related   Lab 01/18/12 1057 01/17/12 0115  AST 489* 70*  ALT 340* 58*  ALKPHOS 69 62  BILITOT 0.6 1.2  PROT 7.3 7.7  ALBUMIN 2.6* 2.9*  INR -- 1.73*  ?rise ?related to amio  -check cmet in am    Infected carbuncle/cyst right groin  -check wound cx   Best practices / Disposition:  PCCM consulting, Dr. Sharyn Lull primary physician  Feeding/protein malnutrition:  Analgesia:   Sedation:   Thromboprophylaxis: heparin HOB >30 degrees Ulcer prophylaxis: ppi  Glucose control/hyperglycemia: monitor cbg Family : wife updated 01/18/12   [facility] NP -C [facility]  [phone]  01/20/2012 7:36 AM  Reviewed above, examined pt and agree with assessment/plan.  Resp status stable.  Continue ABx for now.  Keep in negative fluid balance.  F/U LFT.  Monitor for ETOH withdrawal.  Updated wife at bedside.  Coralyn Helling, MD 01/20/2012, 12:18 PM Pager:  [phone]

## 2012-01-21 ENCOUNTER — Inpatient Hospital Stay (HOSPITAL_COMMUNITY): Payer: 59

## 2012-01-21 LAB — COMPREHENSIVE METABOLIC PANEL
Alkaline Phosphatase: 55 U/L (ref 39–117)
BUN: 34 mg/dL — ABNORMAL HIGH (ref 6–23)
CO2: 26 mEq/L (ref 19–32)
Chloride: 104 mEq/L (ref 96–112)
GFR calc Af Amer: 67 mL/min — ABNORMAL LOW (ref >90)
Glucose, Bld: 152 mg/dL — ABNORMAL HIGH (ref 70–99)
Potassium: 3.8 mEq/L (ref 3.5–5.1)
Total Bilirubin: 0.5 mg/dL (ref 0.3–1.2)

## 2012-01-21 LAB — CBC
HCT: 31.2 % — ABNORMAL LOW (ref 39.0–52.0)
Hemoglobin: 10.1 g/dL — ABNORMAL LOW (ref 13.0–17.0)
WBC: 11.4 10*3/uL — ABNORMAL HIGH (ref 4.0–10.5)

## 2012-01-21 MED ORDER — SODIUM CHLORIDE 0.9 % IV SOLN: 250.0000 mL | INTRAVENOUS | Status: DC | PRN

## 2012-01-21 MED ORDER — DIAZEPAM 5 MG PO TABS
5.0000 mg | ORAL_TABLET | ORAL | Status: AC
  Administered 2012-01-22: 5 mg via ORAL
  Filled 2012-01-21: qty 1

## 2012-01-21 MED ORDER — SODIUM CHLORIDE 0.9 % IJ SOLN: 3.0000 mL | INTRAMUSCULAR | Status: DC | PRN

## 2012-01-21 MED ORDER — SODIUM CHLORIDE 0.9 % IV SOLN
1.0000 mL/kg/h | INTRAVENOUS | Status: DC
  Administered 2012-01-22: 1 mL/kg/h via INTRAVENOUS

## 2012-01-21 MED ORDER — ASPIRIN 81 MG PO CHEW
324.0000 mg | CHEWABLE_TABLET | ORAL | Status: AC
  Administered 2012-01-22: 324 mg via ORAL
  Filled 2012-01-21: qty 4

## 2012-01-21 MED ORDER — BIOTENE DRY MOUTH MT LIQD
15.0000 mL | Freq: Two times a day (BID) | OROMUCOSAL | Status: DC
  Administered 2012-01-21 – 2012-01-26 (×10): 15 mL via OROMUCOSAL

## 2012-01-21 MED ORDER — SODIUM CHLORIDE 0.9 % IJ SOLN
3.0000 mL | Freq: Two times a day (BID) | INTRAMUSCULAR | Status: DC
  Administered 2012-01-21 – 2012-01-22 (×2): 3 mL via INTRAVENOUS

## 2012-01-21 MED ORDER — ASPIRIN 81 MG PO CHEW
CHEWABLE_TABLET | ORAL | Status: AC
  Filled 2012-01-21: qty 1

## 2012-01-21 NOTE — Progress Notes (Signed)
Name: Norman Rodgers MRN: [id number] DOB: [birth year]  LOS: 5  PCCM Progress Note   History of Present Illness: 66 y/o male with no past medical history presented to [facility] ED on 2/13 with several days of dyspnea and malaise.  He was found to be in SVT and likely volume overload and was admitted to cardiology for further management.  He underwent an RFA today for recurrent SVT atrial tachycardia and was somnolent post procedure so was electively intubated.  PCCM consulted for further management.  Lines / Drains: 2/14 ETT >>2/16 (self extubated)  [facility] Protonix Heprin sq  Cultures / Sepsis markers: 2/14 blood >> 2/14 sputum >> 2/14 urine st/leg ag >> NEG  Results for orders placed during the hospital encounter of 01/16/12  MRSA PCR SCREENING     Status: Normal   Collection Time   01/17/12  1:14 AM      Component Value Range Status Comment   MRSA by PCR NEGATIVE  NEGATIVE  Final   CULTURE, BLOOD (ROUTINE X 2)     Status: Normal (Preliminary result)   Collection Time   01/17/12  8:35 PM      Component Value Range Status Comment   Specimen Description BLOOD LEFT HAND   Final    Special Requests BOTTLES DRAWN AEROBIC AND ANAEROBIC 10CC   Final    Culture  Setup Time [id number]   Final    Culture     Final    Value:        BLOOD CULTURE RECEIVED NO GROWTH TO DATE CULTURE WILL BE HELD FOR 5 DAYS BEFORE ISSUING A FINAL NEGATIVE REPORT   Report Status PENDING   Incomplete   CULTURE, BLOOD (ROUTINE X 2)     Status: Normal (Preliminary result)   Collection Time   01/17/12  8:36 PM      Component Value Range Status Comment   Specimen Description BLOOD RIGHT HAND   Final    Special Requests BOTTLES DRAWN AEROBIC AND ANAEROBIC 5CC   Final    Culture  Setup Time [id number]   Final    Culture     Final    Value:        BLOOD CULTURE RECEIVED NO GROWTH TO DATE CULTURE WILL BE HELD FOR 5 DAYS BEFORE ISSUING A FINAL NEGATIVE REPORT   Report Status PENDING   Incomplete   WOUND CULTURE      Status: Normal (Preliminary result)   Collection Time   01/20/12  9:16 AM      Component Value Range Status Comment   Specimen Description WOUND RIGHT GROIN   Final    Special Requests NONE   Final    Gram Stain PENDING   Incomplete    Culture NO GROWTH 1 DAY   Final    Report Status PENDING   Incomplete      Antibiotics: 2/14 Ceft (pneumonia?) >> 2/14 azithro (pneumonia?) >> 2/14 vanc (pneumonia?) >> 2/14  Anti-infectives     Start     Dose/Rate Route Frequency Ordered Stop   01/17/12 2000   vancomycin (VANCOCIN) 2,000 mg in sodium chloride 0.9 % 500 mL IVPB  Status:  Discontinued        2,000 mg 250 mL/hr over 120 Minutes Intravenous  Once 01/17/12 1824 01/17/12 2007   01/17/12 0745   cefTRIAXone (ROCEPHIN) 1 g in dextrose 5 % 50 mL IVPB     Comments: First dose stat      1 g 100 mL/hr  over 30 Minutes Intravenous Every 24 hours 01/17/12 0741     01/17/12 0745   azithromycin (ZITHROMAX) 500 mg in dextrose 5 % 250 mL IVPB        500 mg 250 mL/hr over 60 Minutes Intravenous Every 24 hours 01/17/12 0741             Tests / Events: 2/14 EP study, ablation for SVT  2/14 TTE: global hypokinesis of LV, LVEF 20% 2/14 CT angio>NEG PE , Bil. aspdz +/-PNA/edema  2/18- no resp distress    SUBJECTIVE/OVERNIGHT?INTERVAL HX  no etoh wd noted  Vital Signs:   Filed Vitals:   01/21/12 0600 01/21/12 0700 01/21/12 0800 01/21/12 0918  BP:  82/59 96/70 110/73  Pulse:    70  Temp:   97 F (36.1 C)   TempSrc:   Oral   Resp: 26 20 17    Height:      Weight:      SpO2: 96% 99%      Physical Examination: Gen: chronically ill appearing, NAD  HEENT: ett out Neck: supple without masses PULM scattered rhonchi  CV: RRR, s1 s2 AB: BS+, soft, nontender, no hsm Ext: warm, tr-1+ edema, no clubbing, no cyanosis Derm: along right inner groin , tender boil ,+post purulent drainage, foul odor  Neuro: alert x 2, no focal deficits noted  Psyche: flat affect ,   Labs and Imaging:     CBC    Component Value Date/Time   WBC 11.4* 01/21/2012 0445   RBC 3.38* 01/21/2012 0445   HGB 10.1* 01/21/2012 0445   HCT 31.2* 01/21/2012 0445   PLT 112* 01/21/2012 0445   MCV 92.3 01/21/2012 0445   MCH 29.9 01/21/2012 0445   MCHC 32.4 01/21/2012 0445   RDW 16.1* 01/21/2012 0445   LYMPHSABS 2.2 01/17/2012 0115   MONOABS 0.6 01/17/2012 0115   EOSABS 0.0 01/17/2012 0115   BASOSABS 0.0 01/17/2012 0115    BMET    Component Value Date/Time   NA 138 01/21/2012 0445   K 3.8 01/21/2012 0445   CL 104 01/21/2012 0445   CO2 26 01/21/2012 0445   GLUCOSE 152* 01/21/2012 0445   BUN 34* 01/21/2012 0445   CREATININE 1.28 01/21/2012 0445   CALCIUM 8.5 01/21/2012 0445   GFRNONAA 58* 01/21/2012 0445   GFRAA 67* 01/21/2012 0445     Lab 01/21/12 0445 01/20/12 0557 01/19/12 0600  HGB 10.1* 11.9* 10.8*  HCT 31.2* 36.0* 33.5*  WBC 11.4* 14.3* 8.4  PLT 112* 122* 129*     Lab 01/21/12 0445 01/20/12 0557 01/18/12 1057 01/17/12 0759 01/17/12 0115  NA 138 142 135 141 136  K 3.8 3.9 -- -- --  CL 104 108 102 106 104  CO2 26 23 19  14* 15*  GLUCOSE 152* 152* 156* 98 108*  BUN 34* 38* 50* 36* 29*  CREATININE 1.28 1.39* 1.75* 1.64* 1.56*  CALCIUM 8.5 8.9 8.6 9.5 8.9  MG -- -- -- -- 2.3  PHOS -- -- -- -- --     Lab 01/19/12 2141 01/17/12 2038 01/16/12 2230  PHART 7.461* 7.323* --  PCO2ART 30.6* 38.7 --  PO2ART 70.0* 85.5 --  HCO3 21.8 19.5* --  TCO2 23 20.7 19  O2SAT 95.0 94.6 --   No results found for this basename: PROCALCITON:5 in the last 168 hours     Lab 01/18/12 1057 01/17/12 1309 01/17/12 0759 01/17/12 0116  TROPONINI 1.01* <0.30 0.32* <0.30   No results found for this basename: PROBNP:5 in  the last 168 hours Dg Chest Port 1 View  01/21/2012  *RADIOLOGY REPORT*  Clinical Data: Follow up edema versus pneumonia.  PORTABLE CHEST - 1 VIEW 01/21/2012 0550 hours:  Comparison: Portable chest x-rays yesterday dating back to 01/17/2012.  CTA chest 01/17/2012.  Findings: Cardiac silhouette enlarged  but stable.  No significant change since yesterday in the patchy airspace opacities throughout both lungs, greatest in the left upper lobe and in the medial right base.  No new pulmonary parenchymal abnormalities.  Overall, improvement since the examinations 4 days ago.  IMPRESSION: No significant change since yesterday in the residual mild patchy bilateral pneumonia, though significant improvement since the examination 4 days ago.  No new abnormalities.  Original Report Authenticated By: Arnell Sieving, M.D.   Dg Chest Port 1 View  01/20/2012  *RADIOLOGY REPORT*  Clinical Data: Pneumonia  PORTABLE CHEST - 1 VIEW  Comparison:   the previous day's study  Findings: The patient has been extubated and the nasogastric tube removed.  Patchy airspace opacities at both lung bases, in the left midlung, and at the right lung apex as before.  No definite effusion although the left lateral costophrenic angle is excluded. Stable cardiomegaly.  IMPRESSION:  1.  Interval extubation with little change in patchy bilateral airspace disease.  Original Report Authenticated By: Osa Craver, M.D.      Assessment and Plan:  This is a 66 y/o male with no past medical history but a significant alcohol use history presented on 2/13 with respiratory distress and SVT.  Intubated for respiratory insufficiency after EP study/ablation with what appeared to  be grossly volume overload   Acute Respiratory failure (01/17/2012)   Assessment: due to volume overload +/- pneumonia.  2/17>self extubated 2/16 , sats adequate , xray w/ no sign change in bilateral aspdz     Plan:  -check xray with continued edema -IS -Consider neg balance with lasix after cath Consider kvo volume  SVT (supraventricular tachycardia) (01/16/2012)   Assessment: s/p failed ablation on 2/14 w/ Bump in card enzymes   Plan:  -Cont amio, coreg -per cardiology,looks like cath planned Lasix per cards when safe or no cath done  CHF (congestive  heart failure) (01/17/2012)   Assessment: due to alcohol abuse vs. SVT?  CM on echo w/ low EF  2/17:remains neg I/O bal.     Plan:  -goal  negative fluid balance-no diuresis today ? Cath as reason likely -per cards   CAD (coronary artery disease) (01/17/2012)   Assessment: question of old ant MI.    Plan:  -per cardiology  Pneumonia (01/17/2012)   Plan:  -continue ceftriaxone and azithro for now Add stop date azithro for am  Ceftriaxone will need max 7 days, unless folicular dz needed longer?  -f/u cultures  Alcohol abuse/Agitation/Delirium/Enceophalopathy (01/17/2012)   Assessment:    2/17 : improved, no sign of w/dr currently     Plan:  -daily thiamine/folate/mvi supplementation  -check swallow eval -for barrium swallow now -mobilize pt   Elevated LFTs ? etoh related   Lab 01/21/12 0445 01/18/12 1057 01/17/12 0115  AST 94* 489* 70*  ALT 145* 340* 58*  ALKPHOS 55 69 62  BILITOT 0.5 0.6 1.2  PROT 6.1 7.3 7.7  ALBUMIN 2.0* 2.6* 2.9*  INR -- -- 1.73*  ?rise ?related to amio improving    Infected carbuncle/cyst right groin  -check wound cx  May need to reduce ativan or change to prn  Best practices / Disposition: PCCM consulting,  Dr. Sharyn Lull primary physician  Will sign off , ca ll if needed Feeding/protein malnutrition:  Analgesia:   Sedation:   Thromboprophylaxis: heparin HOB >30 degrees Ulcer prophylaxis: ppi  Glucose control/hyperglycemia: monitor cbg Family : wife updated 01/18/12   Nelda Bucks MD [facility] /[facility]  [phone]  01/21/2012 9:51 AM  Mcarthur Rossetti. Tyson Alias, MD, FACP Pgr: [phone] [facility]

## 2012-01-21 NOTE — Transfer of Care (Signed)
Immediate Anesthesia Transfer of Care Note  Patient: Norman Rodgers  Procedure(s) Performed: Procedure(s) (LRB): SUPRAVENTRICULAR TACHYCARDIA ABLATION (N/A)  Patient Location:

## 2012-01-21 NOTE — Anesthesia Preprocedure Evaluation (Signed)
Anesthesia Evaluation Anesthesia Physical Anesthesia Plan  ASA: IV and Emergent  Anesthesia Plan:    Post-op Pain Management:    Induction:   Airway Management Planned:   Additional Equipment:   Intra-op Plan:   Post-operative Plan:   Informed Consent:   Plan Discussed with:   Anesthesia Plan Comments:         Anesthesia Quick Evaluation  Called for stat intubation in cath lab.

## 2012-01-21 NOTE — Progress Notes (Signed)
Subjective:  Patient denies any chest pain states breathing is improved Patient remains in sinus rhythm with junctional escape rhythm  Objective:  Vital Signs in the last 24 hours: Temp:  [97 F (36.1 C)-97.8 F (36.6 C)] 97.7 F (36.5 C) (02/18 1600) Pulse Rate:  [44-70] 70  (02/18 0918) Resp:  [4-34] 18  (02/18 1800) BP: (82-117)/(57-87) 94/69 mmHg (02/18 1800) SpO2:  [89 %-99 %] 95 % (02/18 1800)  Intake/Output from previous day: 02/17 0701 - 02/18 0700 In: 3459.4 [P.O.:1560; I.V.:1597.4; IV Piggyback:302] Out: 1620 [Urine:1620] Intake/Output from this shift: Total I/O In: 410 [I.V.:150; Other:10; IV Piggyback:250] Out: 475 [Urine:475]  Physical Exam: Neck: no adenopathy, no carotid bruit, no JVD and supple, symmetrical, trachea midline Lungs: Decrease stress on at bases with occasional rhonchi Heart: regular rate and rhythm, S1, S2 normal and Soft systolic murmur Abdomen: soft, non-tender; bowel sounds normal; no masses,  no organomegaly Extremities: extremities normal, atraumatic, no cyanosis or edema  Lab Results:  Basename 01/21/12 0445 01/20/12 0557  WBC 11.4* 14.3*  HGB 10.1* 11.9*  PLT 112* 122*    Basename 01/21/12 0445 01/20/12 0557  NA 138 142  K 3.8 3.9  CL 104 108  CO2 26 23  GLUCOSE 152* 152*  BUN 34* 38*  CREATININE 1.28 1.39*   No results found for this basename: TROPONINI:2,CK,MB:2 in the last 72 hours Hepatic Function Panel  Basename 01/21/12 0445  PROT 6.1  ALBUMIN 2.0*  AST 94*  ALT 145*  ALKPHOS 55  BILITOT 0.5  BILIDIR --  IBILI --   No results found for this basename: CHOL in the last 72 hours No results found for this basename: PROTIME in the last 72 hours  Imaging: Imaging results have been reviewed and Dg Chest Port 1 View  01/21/2012  *RADIOLOGY REPORT*  Clinical Data: Follow up edema versus pneumonia.  PORTABLE CHEST - 1 VIEW 01/21/2012 0550 hours:  Comparison: Portable chest x-rays yesterday dating back to 01/17/2012.   CTA chest 01/17/2012.  Findings: Cardiac silhouette enlarged but stable.  No significant change since yesterday in the patchy airspace opacities throughout both lungs, greatest in the left upper lobe and in the medial right base.  No new pulmonary parenchymal abnormalities.  Overall, improvement since the examinations 4 days ago.  IMPRESSION: No significant change since yesterday in the residual mild patchy bilateral pneumonia, though significant improvement since the examination 4 days ago.  No new abnormalities.  Original Report Authenticated By: Arnell Sieving, M.D.   Dg Chest Port 1 View  01/20/2012  *RADIOLOGY REPORT*  Clinical Data: Pneumonia  PORTABLE CHEST - 1 VIEW  Comparison:   the previous day's study  Findings: The patient has been extubated and the nasogastric tube removed.  Patchy airspace opacities at both lung bases, in the left midlung, and at the right lung apex as before.  No definite effusion although the left lateral costophrenic angle is excluded. Stable cardiomegaly.  IMPRESSION:  1.  Interval extubation with little change in patchy bilateral airspace disease.  Original Report Authenticated By: Osa Craver, M.D.   Dg Swallowing Func-no Report  01/21/2012  CLINICAL DATA: objectively assess swallow function   FLUOROSCOPY FOR SWALLOWING FUNCTION STUDY:  Fluoroscopy was provided for swallowing function study, which was  administered by a speech pathologist.  Final results and recommendations  from this study are contained within the speech pathology report.      Cardiac Studies:  Assessment/Plan:  Status post recurrent SVT/atrial tachycardia status post attempted  atrial tachycardia ablation Resolving systolic heart failure multifactorial Status post acute respiratory failure Resolving bilateral pneumonia COPD Hypertension History of tobacco abuse Morbid obesity Mild renal insufficiency improved Plan Discussed with patient and the his family regarding her left  cath possible PTCA stenting its risk and benefits i.e. death MI stroke need for emergency CABG local vascular complications etc. and consented for PCI   LOS: 5 days    Joleene Burnham N 01/21/2012, 6:34 PM

## 2012-01-21 NOTE — Anesthesia Postprocedure Evaluation (Signed)
  Anesthesia Post-op Note  Patient: Norman Rodgers  Procedure(s) Performed: Procedure(s) (LRB): SUPRAVENTRICULAR TACHYCARDIA ABLATION (N/A) Complications   N/A

## 2012-01-21 NOTE — Procedures (Signed)
Modified Barium Swallow Procedure Note Patient Details  Name: Norman Rodgers MRN: [id number] Date of Birth: 1946-08-29  Today's Date: 01/21/2012 Time:  -     Past Medical History: History reviewed. No pertinent past medical history. Past Surgical History: History reviewed. No pertinent past surgical history. HPI:  66 y/o male with no past medical history presented to [facility] ED on 2/13 with several days of dyspnea and malaise.  He was found to be in SVT and likely volume overload, underwent an RFA for recurrent SVT atrial tachycardia and was somnolent post procedure so was electively intubated.  ETT 2/14-2/16 due to self-extubation.  Chest x-ray on 2/17 showed bibasilar airspace disease. Results of bedside swallow suggest possible structural pharyngeal phase dysphagia and an objective test was recommended.      Recommendation/Prognosis  Clinical Impression Dysphagia Diagnosis: Within Functional Limits Clinical impression: Pt presents with a functional oropharyngeal swallow. Mildly prolonged but functional oral transit of bolus noted, suspect baseline vs secondary to lethargy; however, it is not impacting pt's safety and ability to protect airway as no aspiration/penetration was observed. Recommend a regular diet with thin liquids. No further SLP services are needed at this time.  Swallow Evaluation Recommendations Solid Consistency: Regular Liquid Consistency: Thin Liquid Administration via: Cup;Straw Medication Administration: Whole meds with liquid Supervision: Patient able to self feed;Intermittent supervision to cue for compensatory strategies Compensations: Slow rate;Small sips/bites Postural Changes and/or Swallow Maneuvers: Seated upright 90 degrees Oral Care Recommendations: Oral care BID Follow up Recommendations: None Prognosis Prognosis for Safe Diet Advancement: Good Individuals Consulted Consulted and Agree with Results and Recommendations: Patient;RN  General:  Date of  Onset: 01/17/12 Type of Study: Initial MBS Diet Prior to this Study: Thin liquids (No solids) Temperature Spikes Noted: No Respiratory Status: Supplemental O2 delivered via (comment) (nasal cannula; 4L) History of Intubation: Yes Length of Intubations (days): 2 days Date extubated: 01/19/12 Behavior/Cognition: Cooperative;Lethargic Oral Cavity - Dentition: Dentures, top;Dentures, bottom Oral Motor / Sensory Function: Within functional limits (per bedside swallow evaluation) Vision: Functional for self-feeding Patient Positioning: Upright in chair Baseline Vocal Quality: Clear Volitional Cough: Strong Volitional Swallow: Able to elicit Anatomy: Within functional limits  Oral Phase Oral Preparation/Oral Phase Oral Phase: Impaired Oral - Thin Oral - Thin Cup: Lingual/palatal residue Oral - Thin Straw: Lingual/palatal residue Oral - Solids Oral - Puree: Lingual/palatal residue Oral - Mechanical Soft: Lingual/palatal residue;Delayed oral transit Oral - Pill: Reduced posterior propulsion;Lingual/palatal residue;Delayed oral transit (pill left on tongue, cleared with additional thin liquid) Pharyngeal Phase  Pharyngeal Phase Pharyngeal Phase: Impaired Pharyngeal - Solids Pharyngeal - Puree: Premature spillage to valleculae (mild premature spillage, airway fully protected) Pharyngeal - Mechanical Soft: Premature spillage to valleculae (moderate premature spillage, airway fully protected) Cervical Esophageal Phase  Cervical Esophageal Phase Cervical Esophageal Phase: Leonarda Salon     Maxcine Ham 01/21/2012, 2:22 PM  Maxcine Ham, SLP Student

## 2012-01-22 ENCOUNTER — Encounter (HOSPITAL_COMMUNITY)
Admission: EM | Disposition: A | Discharge status: Home / Self Care | Payer: Self-pay | Source: Home / Self Care | Attending: Cardiology

## 2012-01-22 ENCOUNTER — Other Ambulatory Visit: Payer: Self-pay

## 2012-01-22 HISTORY — PX: LEFT HEART CATHETERIZATION WITH CORONARY ANGIOGRAM: SHX5451

## 2012-01-22 SURGERY — LEFT HEART CATHETERIZATION WITH CORONARY ANGIOGRAM
Anesthesia: LOCAL

## 2012-01-22 MED ORDER — RAMIPRIL 2.5 MG PO CAPS
2.5000 mg | ORAL_CAPSULE | Freq: Every day | ORAL | Status: DC
  Administered 2012-01-23 – 2012-01-26 (×4): 2.5 mg via ORAL
  Filled 2012-01-22 (×5): qty 1

## 2012-01-22 MED ORDER — ACETAMINOPHEN 325 MG PO TABS
650.0000 mg | ORAL_TABLET | ORAL | Status: DC | PRN
  Administered 2012-01-23: 650 mg via ORAL
  Filled 2012-01-22: qty 2

## 2012-01-22 MED ORDER — ROSUVASTATIN CALCIUM 40 MG PO TABS
40.0000 mg | ORAL_TABLET | Freq: Every day | ORAL | Status: DC
  Administered 2012-01-22 – 2012-01-25 (×4): 40 mg via ORAL
  Filled 2012-01-22 (×5): qty 1

## 2012-01-22 MED ORDER — TICAGRELOR 90 MG PO TABS
90.0000 mg | ORAL_TABLET | Freq: Two times a day (BID) | ORAL | Status: DC
  Administered 2012-01-22 – 2012-01-26 (×8): 90 mg via ORAL
  Filled 2012-01-22 (×9): qty 1

## 2012-01-22 MED ORDER — SODIUM CHLORIDE 0.9 % IV SOLN
INTRAVENOUS | Status: AC
  Administered 2012-01-22 (×2): via INTRAVENOUS

## 2012-01-22 MED ORDER — ONDANSETRON HCL 4 MG/2ML IJ SOLN: 4.0000 mg | Freq: Four times a day (QID) | INTRAMUSCULAR | Status: DC | PRN

## 2012-01-22 MED ORDER — BIVALIRUDIN 250 MG IV SOLR
INTRAVENOUS | Status: AC
  Filled 2012-01-22: qty 250

## 2012-01-22 MED ORDER — ASPIRIN 81 MG PO CHEW
81.0000 mg | CHEWABLE_TABLET | Freq: Every day | ORAL | Status: DC
  Administered 2012-01-23 – 2012-01-26 (×4): 81 mg via ORAL
  Filled 2012-01-22 (×4): qty 1

## 2012-01-22 MED ORDER — NITROGLYCERIN 0.2 MG/ML ON CALL CATH LAB
INTRAVENOUS | Status: AC
  Filled 2012-01-22: qty 1

## 2012-01-22 MED ORDER — ACETAMINOPHEN 325 MG PO TABS: 650.0000 mg | ORAL_TABLET | ORAL | Status: DC | PRN

## 2012-01-22 MED ORDER — HEPARIN (PORCINE) IN NACL 2-0.9 UNIT/ML-% IJ SOLN
INTRAMUSCULAR | Status: AC
  Filled 2012-01-22: qty 2000

## 2012-01-22 MED ORDER — CARVEDILOL 12.5 MG PO TABS
12.5000 mg | ORAL_TABLET | Freq: Two times a day (BID) | ORAL | Status: DC
  Filled 2012-01-22 (×2): qty 1

## 2012-01-22 MED ORDER — DOPAMINE-DEXTROSE 3.2-5 MG/ML-% IV SOLN
2.0000 ug/kg/min | INTRAVENOUS | Status: DC
  Administered 2012-01-22: 3 ug/kg/min via INTRAVENOUS

## 2012-01-22 MED ORDER — CARVEDILOL 6.25 MG PO TABS
6.2500 mg | ORAL_TABLET | Freq: Two times a day (BID) | ORAL | Status: DC
  Administered 2012-01-22 – 2012-01-23 (×3): 6.25 mg via ORAL
  Filled 2012-01-22 (×4): qty 1

## 2012-01-22 MED ORDER — LIDOCAINE HCL (PF) 1 % IJ SOLN
INTRAMUSCULAR | Status: AC
  Filled 2012-01-22: qty 30

## 2012-01-22 MED ORDER — TICAGRELOR 90 MG PO TABS
ORAL_TABLET | ORAL | Status: AC
  Administered 2012-01-23: 90 mg via ORAL
  Filled 2012-01-22: qty 2

## 2012-01-22 MED ORDER — BIVALIRUDIN 250 MG IV SOLR
INTRAVENOUS | Status: AC
  Administered 2012-01-22: 0.25 mg
  Filled 2012-01-22: qty 250

## 2012-01-22 NOTE — Progress Notes (Signed)
UR Completed/ UPDATED Simmons, Mida Cory F [phone]  

## 2012-01-22 NOTE — Op Note (Signed)
Left cardiac cath/PTCA stenting report dictated on 219 13 dictation number is [phone]

## 2012-01-22 NOTE — Cardiovascular Report (Signed)
NAMEAIDEN, HELZER NO.:  [id number]  MEDICAL RECORD NO.:  [id number]  LOCATION:  2902                         FACILITY:  [facility]  PHYSICIAN:  Eduardo Osier. Sharyn Lull, M.D. DATE OF BIRTH:  19-Feb-1946  DATE OF PROCEDURE:  01/22/2012 DATE OF DISCHARGE:                           CARDIAC CATHETERIZATION   PROCEDURE: 1. Left cardiac cath with selective left and right coronary     angiography via right groin using Judkins technique. 2. Successful PTCA to mid LAD using 3.0 x 12 mm long Emerge balloon. 3. Successful deployment of PROMUS Element Plus drug-eluting stent,     4.0 x 20 mm long. 4. Successful postdilatation of this stent using 4.5 x 15 mm long [place]     TREK balloon.  INDICATION FOR THE PROCEDURE:  Mr. Sesler is a 66 year old black male with no significant past medical history, except for tobacco abuse and alcohol abuse.  He came to the ER complaining of generalized malaise, weakness, and shortness of breath for the last 4 days associated with palpitation.  The patient denies any chest pain, nausea or vomiting, or diaphoresis.  Denies any history of PND, orthopnea, or leg swelling. States he drinks approximately 1.1 pint of hard liquor daily for the last 12 years.  Denies any cardiac workup in the recent past.  Denies any cough, fever, chills, or flu-like symptoms.  In the ER, the patient was noted to be in supraventricular tachycardia with heart rate in 170s. The patient received initially 20 mg of IV Cardizem with conversion to normal sinus rhythm.  EKG showed anteroseptal wall myocardial infarction, age undetermined, and ST-T wave changes in inferolateral leads, which were new as compared to prior EKG.  The patient had multiple episodes of recurrent paroxysmal SVT/atrial tachycardia, initially requiring carotid massage with conversion to sinus rhythm and then IV Lopressor, had no card and finally IV verapamil with conversion back to sinus rhythm.  The patient  denies any chest pain, but continues to complain of shortness of breath and was noted to be in mild congestive heart failure.  His troponin-I was mildly elevated so was his D-dimer.  The patient subsequently underwent spiral CT of the chest, which was negative for PE.  EP consultation was obtained for SVT ablation.  The patient subsequently underwent attempted atrial tachycardia ablation without success.  The patient postprocedure became somnolent requiring intubation and was successfully extubated 2 days ago and is being treated for possible aspiration pneumonia.  Due to elevated cardiac enzymes, EKG changes, and heart failure with poor LV function, discussed with the patient and family regarding left cath, possible PTCA stenting, its risks and benefits, i.e., death, MI, stroke, need for emergency CABG, local vascular complications, etc., and consented for PCI.  PROCEDURE:  After obtaining the informed consent, the patient was brought to the cath lab and was placed on fluoroscopy table.  Right groin was prepped and draped in usual fashion.  Xylocaine 1% was used for local anesthesia in the right groin.  With the help of thin wall needle, a 5-French arterial sheath was placed.  The sheath was aspirated and flushed.  Next, 5-French left Judkins catheter was advanced over the wire under fluoroscopic guidance  up to the ascending aorta.  Wire was pulled out.  The catheter was aspirated and connected to the Manifold. Catheter was further advanced and engaged into left coronary ostium. Multiple views of the left system were taken.  Next, catheter was disengaged and was pulled out over the wire and was replaced with 5- [place] right Judkins catheter, which was advanced over the wire under fluoroscopic guidance up to the ascending aorta.  Wire was pulled out. The catheter was aspirated and connected to the Manifold.  Catheter was further advanced and engaged into right coronary ostium.  A single  view of right coronary artery was obtained.  Next, the catheter was disengaged and was pulled out over the wire and the 5-French sheath was replaced with 6-French.  FINDINGS:  LV was not done.  Left main was patent.  LAD was large, which had 70%-75% mid stenosis with haziness.  Left circumflex was moderate size, which was patent, but had haziness in the mid portion with TIMI grade 3 distal flow.  Ramus was small, which was patent.  Diagonal 1 and 2 were small, which were patent.  Obtuse marginal 1 and 2 were less than 0.5 mm; 3, 4, and 5 were small, which were patent.  RCA was patent.  The patient has codominant coronary system.  INTERVENTIONAL PROCEDURE:  Successful PTCA to mid LAD was done using 3.0 x 12 mm long Emerge balloon for predilatation and then 4.0 x 20 mm long PROMUS Element Plus drug-eluting stent was deployed at 11 atmospheric pressure in mid LAD.  This stent was postdilated using 4.5 x 15 mm long [place] TREK balloon going up to 18 atmospheric pressure.  Lesion was dilated from 70%-75% to 0% residual with excellent TIMI grade 3 distal flow without evidence of dissection or distal embolization.  The patient received weight-based Angiomax and 180 mg of Brilinta prior to the procedure.  The patient was also started on dopamine prior to the procedure as his blood pressure was in 80s systolic, which is being weaned off.  The patient tolerated the procedure well.  There were no complications.  The patient was transferred to recovery room in stable condition.     Eduardo Osier. Sharyn Lull, M.D.     MNH/MEDQ  D:  01/22/2012  T:  01/22/2012  Job:  [id number]

## 2012-01-22 NOTE — Progress Notes (Signed)
At 21:18 Pt. Had 10beats runs of V-tach , non-substained; asymptomatic, VS wnl; Dr. Marni Griffon notified. New orders received. Will continue to monitor pt.

## 2012-01-23 LAB — CBC
HCT: 32.7 % — ABNORMAL LOW (ref 39.0–52.0)
Hemoglobin: 10.8 g/dL — ABNORMAL LOW (ref 13.0–17.0)
MCH: 29.8 pg (ref 26.0–34.0)
MCHC: 33 g/dL (ref 30.0–36.0)
MCV: 90.1 fL (ref 78.0–100.0)
Platelets: 132 K/uL — ABNORMAL LOW (ref 150–400)
RBC: 3.63 MIL/uL — ABNORMAL LOW (ref 4.22–5.81)
RDW: 15.7 % — ABNORMAL HIGH (ref 11.5–15.5)
WBC: 8.2 K/uL (ref 4.0–10.5)

## 2012-01-23 LAB — MAGNESIUM: Magnesium: 2.4 mg/dL (ref 1.5–2.5)

## 2012-01-23 LAB — WOUND CULTURE: Gram Stain: NONE SEEN

## 2012-01-23 LAB — BASIC METABOLIC PANEL
CO2: 23 mEq/L (ref 19–32)
Chloride: 103 mEq/L (ref 96–112)
Creatinine, Ser: 1.87 mg/dL — ABNORMAL HIGH (ref 0.50–1.35)
Glucose, Bld: 131 mg/dL — ABNORMAL HIGH (ref 70–99)

## 2012-01-23 MED ORDER — SODIUM CHLORIDE 0.9 % IV SOLN
INTRAVENOUS | Status: DC
  Administered 2012-01-23: 01:00:00 via INTRAVENOUS

## 2012-01-23 MED ORDER — FOLIC ACID 1 MG PO TABS
1.0000 mg | ORAL_TABLET | Freq: Every day | ORAL | Status: DC
  Administered 2012-01-24 – 2012-01-26 (×3): 1 mg via ORAL
  Filled 2012-01-23 (×3): qty 1

## 2012-01-23 MED ORDER — PANTOPRAZOLE SODIUM 40 MG PO TBEC
40.0000 mg | DELAYED_RELEASE_TABLET | Freq: Every day | ORAL | Status: DC
  Administered 2012-01-24 – 2012-01-26 (×3): 40 mg via ORAL
  Filled 2012-01-23 (×3): qty 1

## 2012-01-23 MED ORDER — PANTOPRAZOLE SODIUM 40 MG PO TBEC: 40.0000 mg | DELAYED_RELEASE_TABLET | Freq: Every day | ORAL | Status: DC

## 2012-01-23 MED ORDER — VITAMIN B-1 100 MG PO TABS
100.0000 mg | ORAL_TABLET | Freq: Every day | ORAL | Status: DC
  Administered 2012-01-24 – 2012-01-26 (×3): 100 mg via ORAL
  Filled 2012-01-23 (×3): qty 1

## 2012-01-23 MED ORDER — ADULT MULTIVITAMIN W/MINERALS CH
1.0000 | ORAL_TABLET | Freq: Every day | ORAL | Status: DC
  Administered 2012-01-24 – 2012-01-26 (×3): 1 via ORAL
  Filled 2012-01-23 (×3): qty 1

## 2012-01-23 MED ORDER — CARVEDILOL 12.5 MG PO TABS
12.5000 mg | ORAL_TABLET | Freq: Two times a day (BID) | ORAL | Status: DC
  Administered 2012-01-24 – 2012-01-26 (×5): 12.5 mg via ORAL
  Filled 2012-01-23 (×7): qty 1

## 2012-01-23 MED FILL — Dextrose Inj 5%: INTRAVENOUS | Qty: 50 | Status: AC

## 2012-01-23 NOTE — Progress Notes (Signed)
Subjective:  Patient more alert and awake up in chair Denies any chest pain or shortness of breath No further atrial tachycardia arrhythmias Tolerated PTCA stenting to mid LAD  Objective:  Vital Signs in the last 24 hours: Temp:  [97.4 F (36.3 C)-98.2 F (36.8 C)] 98.1 F (36.7 C) (02/20 1645) Pulse Rate:  [67-70] 67  (02/20 1152) Resp:  [20-33] 22  (02/20 1645) BP: (114-145)/(61-90) 114/66 mmHg (02/20 1645) SpO2:  [92 %-99 %] 97 % (02/20 1645) FiO2 (%):  [2 %] 2 % (02/19 1900) Weight:  [113.1 kg (249 lb 5.4 oz)] 113.1 kg (249 lb 5.4 oz) (02/20 0700)  Intake/Output from previous day: 02/19 0701 - 02/20 0700 In: 1959 [P.O.:1200; I.V.:759] Out: 3150 [Urine:3150] Intake/Output from this shift: Total I/O In: 1680 [P.O.:1560; I.V.:20; IV Piggyback:100] Out: 1425 [Urine:1425]  Physical Exam: Neck: no adenopathy, no carotid bruit, no JVD, supple, symmetrical, trachea midline and thyroid not enlarged, symmetric, no tenderness/mass/nodules Lungs: Decreased breath sounds at bases with occasional rhonchi Heart: regular rate and rhythm, S1, S2 normal and Soft systolic murmur noted Abdomen: soft, non-tender; bowel sounds normal; no masses,  no organomegaly Extremities: extremities normal, atraumatic, no cyanosis or edema Right groin stable with no evidence of hematoma Lab Results:  Basename 01/23/12 0649 01/21/12 0445  WBC 8.2 11.4*  HGB 10.8* 10.1*  PLT 132* 112*    Basename 01/23/12 0649 01/21/12 0445  NA 136 138  K 4.1 3.8  CL 103 104  CO2 23 26  GLUCOSE 131* 152*  BUN 30* 34*  CREATININE 1.87* 1.28   No results found for this basename: TROPONINI:2,CK,MB:2 in the last 72 hours Hepatic Function Panel  Basename 01/21/12 0445  PROT 6.1  ALBUMIN 2.0*  AST 94*  ALT 145*  ALKPHOS 55  BILITOT 0.5  BILIDIR --  IBILI --   No results found for this basename: CHOL in the last 72 hours No results found for this basename: PROTIME in the last 72 hours  Imaging: Imaging  results have been reviewed and No results found.  Cardiac Studies:  Assessment/Plan:  Status post recurrent SVT/atrial tachycardia status post attempted atrial tachycardia ablation Status post probable recent anteroseptal wall myocardial infarction status post PCI to LAD Resolving systolic heart failure altered factorial Status post acute respiratory failure Resolving bilateral pneumonia COPD Hypertension History of tobacco abuse Morbid obesity Acute on chronic renal insufficiency Plan Increase beta blockers as per orders Check renal function in a.m. may need to DC ACE inhibitors if renal function deteriorates  LOS: 7 days    Norman Rodgers N 01/23/2012, 5:48 PM

## 2012-01-23 NOTE — Progress Notes (Signed)
CARDIAC REHAB PHASE I   PRE:  Rate/Rhythm: 67SR  BP:  Supine:   Sitting: 126/66  Standing:    SaO2: 96%RA  MODE:  Ambulation: 420 ft   POST:  Rate/Rhythem: 76SR  BP:  Supine:   Sitting: 136/55  Standing:    SaO2: 96%RA 1435-1505 Pt needed much motivation to walk. We checked with pt three times before walking. But once pt up, he was motivated and asked to walk farther. No c/o CP. Tolerated well. Used rolling walker and asst x2 to walk 420 ft. Can be asst x1 next walk. To recliner after walk. Pt asking to walk again later.started MI ed.  Norman Rodgers

## 2012-01-23 NOTE — Progress Notes (Signed)
S/p PCI No further atach.  Continue current medical therapy.  EP to see as needed while here. Please call with questions.  Fayrene Fearing Shealynn Saulnier,MD

## 2012-01-23 NOTE — Progress Notes (Signed)
Came to walk pt but he requests to eat first. Will f/u later in the day. Ethelda Chick CES, ACSM

## 2012-01-24 LAB — CBC
HCT: 35.6 % — ABNORMAL LOW (ref 39.0–52.0)
Hemoglobin: 11.7 g/dL — ABNORMAL LOW (ref 13.0–17.0)
MCHC: 32.9 g/dL (ref 30.0–36.0)
MCV: 90.8 fL (ref 78.0–100.0)

## 2012-01-24 LAB — CULTURE, BLOOD (ROUTINE X 2)
Culture  Setup Time: 201302150207
Culture: NO GROWTH

## 2012-01-24 LAB — BASIC METABOLIC PANEL
BUN: 25 mg/dL — ABNORMAL HIGH (ref 6–23)
Chloride: 104 mEq/L (ref 96–112)
Creatinine, Ser: 1.86 mg/dL — ABNORMAL HIGH (ref 0.50–1.35)
GFR calc non Af Amer: 37 mL/min — ABNORMAL LOW (ref >90)
Glucose, Bld: 114 mg/dL — ABNORMAL HIGH (ref 70–99)
Potassium: 3.9 mEq/L (ref 3.5–5.1)

## 2012-01-24 MED ORDER — SPIRONOLACTONE 25 MG PO TABS
25.0000 mg | ORAL_TABLET | Freq: Every day | ORAL | Status: DC
  Administered 2012-01-24 – 2012-01-26 (×3): 25 mg via ORAL
  Filled 2012-01-24 (×3): qty 1

## 2012-01-24 MED ORDER — AMIODARONE HCL 200 MG PO TABS
200.0000 mg | ORAL_TABLET | Freq: Every day | ORAL | Status: DC
  Administered 2012-01-24 – 2012-01-26 (×3): 200 mg via ORAL
  Filled 2012-01-24 (×3): qty 1

## 2012-01-24 NOTE — Progress Notes (Signed)
Nutrition Follow-up  Self extubated 2/16. MBSS on 2/18 recommending regular diet consistency with thin liquids. Eating 100% of Heart Healthy meals.  Diet Order:  Heart Healthy diet  Meds: Scheduled Meds:   . antiseptic oral rinse  15 mL Mouth Rinse BID  . aspirin  81 mg Oral Daily  . carvedilol  12.5 mg Oral BID WC  . cefTRIAXone (ROCEPHIN) IVPB 1 gram/50 mL D5W  1 g Intravenous Q24H  . folic acid  1 mg Oral Daily  . mulitivitamin with minerals  1 tablet Oral Daily  . pantoprazole  40 mg Oral Q1200  . ramipril  2.5 mg Oral Daily  . rosuvastatin  40 mg Oral q1800  . thiamine  100 mg Oral Daily  . Ticagrelor  90 mg Oral BID  . DISCONTD: carvedilol  6.25 mg Oral BID WC   Continuous Infusions:   . sodium chloride Stopped (01/23/12 1200)  . DOPamine 2 mcg/kg/min (01/22/12 1700)   PRN Meds:.acetaminophen, nitroGLYCERIN, ondansetron (ZOFRAN) IV  Labs:  CMP     Component Value Date/Time   NA 137 01/24/2012 0608   K 3.9 01/24/2012 0608   CL 104 01/24/2012 0608   CO2 26 01/24/2012 0608   GLUCOSE 114* 01/24/2012 0608   BUN 25* 01/24/2012 0608   CREATININE 1.86* 01/24/2012 0608   CALCIUM 8.6 01/24/2012 0608   PROT 6.1 01/21/2012 0445   ALBUMIN 2.0* 01/21/2012 0445   AST 94* 01/21/2012 0445   ALT 145* 01/21/2012 0445   ALKPHOS 55 01/21/2012 0445   BILITOT 0.5 01/21/2012 0445   GFRNONAA 37* 01/24/2012 0608   GFRAA 42* 01/24/2012 0608     Intake/Output Summary (Last 24 hours) at 01/24/12 1143 Last data filed at 01/24/12 1018  Gross per 24 hour  Intake   1440 ml  Output   2325 ml  Net   -885 ml    Weight Status:  113.1 kg, wt up 22.4 kg x 6 days. Family states usual weight is 105 kg.  Re-estimated needs:  Kcal: 1850 - 2000 kcal Protein: 75 - 85 grams Fluid: 1.6 - 1.8 L/d  Nutrition Dx:  Inadequate oral intake r/t inability to eat AEB NPO status. Discontinue this dx. New Nutrition Dx: no new dx  Goal: If unable to extubate within 24-48 hours, recommend initiation of EN. EN to meet  >/= 90% of estimated needs. Discontinue this goal. New Goal: Pt to consume >/= 75% of meals and supplements.  Intervention:  RD answered several questions from family and patient regarding dietary changes. Discussed balancing meals and encouraging less concentrated sweets, high fat foods, and use of salt shaker. Pt eating well at this time.  Monitor:  Weights, labs, PO intake, I/O's  Adair Laundry Pager #:  [phone]

## 2012-01-24 NOTE — Progress Notes (Signed)
   CARE MANAGEMENT NOTE 01/24/2012  Patient:  Norman Rodgers, Norman Rodgers   Account Number:  [id number]  Date Initiated:  01/24/2012  Documentation initiated by:  GRAVES-BIGELOW,Starlee Corralejo  Subjective/Objective Assessment:   Pt admitted with Generalized weakness and shortness of breath-s/p l cath PTCA stenting 01-22-12. Pt is from home with wife.     Action/Plan:   No needs assessed by CM for home at this time. Hopefully plan for d/c today. Pt ambulating well.   Anticipated DC Date:  01/24/2012   Anticipated DC Plan:  HOME/SELF CARE         Choice offered to / List presented to:             Status of service:  Completed, signed off Medicare Important Message given?   (If response is "NO", the following Medicare IM given date fields will be blank) Date Medicare IM given:   Date Additional Medicare IM given:    Discharge Disposition:  HOME/SELF CARE  Per UR Regulation:    Comments:

## 2012-01-24 NOTE — Progress Notes (Addendum)
CARDIAC REHAB PHASE I   PRE:  Rate/Rhythm: 66 SR    BP: sitting 119/75    SaO2:   MODE:  Ambulation: 350 ft   POST:  Rate/Rhythm: 78 SR    BP: sitting 137/81     SaO2:   Pt SOB walking today. Could not hold a full conversation. No SOB at rest. Heart failure ed completed. Pt does not know why he has HF. Sts he saw Dr. Shana Chute 20 yrs ago "for his heart" then no f/u since. 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 Sts he does not have a drinking problem now. Ed completed and requests his [facility] to [facility]. Pt need HF video when he moves to room with a phone. Video #102. Also needs scales at home. [id number]  Harriet Masson CES, ACSM

## 2012-01-24 NOTE — Progress Notes (Signed)
Chaplain made an initial, cold-call visit to patient and family. Patient was in the middle of finishing his lunch. Chaplain introduced self to patient and family and offered to visit with them. Patient and family stated that they were coping okay. Follow up as needed.

## 2012-01-24 NOTE — Progress Notes (Signed)
Subjective:  Patient denies any chest pain complains of shortness of breath with minimal exertion Head 16 beats of nonsustained VT last night asymptomatic Objective:  Vital Signs in the last 24 hours: Temp:  [97.5 F (36.4 C)-98.7 F (37.1 C)] 97.9 F (36.6 C) (02/21 1155) Pulse Rate:  [65-76] 75  (02/21 1155) Resp:  [20-22] 22  (02/21 0325) BP: (114-139)/(55-87) 128/55 mmHg (02/21 0803) SpO2:  [95 %-98 %] 96 % (02/21 1155)  Intake/Output from previous day: 02/20 0701 - 02/21 0700 In: 1940 [P.O.:1800; I.V.:40; IV Piggyback:100] Out: 1925 [Urine:1925] Intake/Output from this shift: Total I/O In: 340 [P.O.:240; IV Piggyback:100] Out: 750 [Urine:750]  Physical Exam: Neck: no adenopathy, no carotid bruit, no JVD and supple, symmetrical, trachea midline Lungs: Decreased breath sound at bases with occasional rhonchi Heart: regular rate and rhythm, S1, S2 normal and Soft systolic murmur noted Abdomen: soft, non-tender; bowel sounds normal; no masses,  no organomegaly Extremities: extremities normal, atraumatic, no cyanosis or edema  Lab Results:  [facility] 01/24/12 0608 01/23/12 0649  WBC 9.9 8.2  HGB 11.7* 10.8*  PLT 135* 132*    Basename 01/24/12 0608 01/23/12 0649  NA 137 136  K 3.9 4.1  CL 104 103  CO2 26 23  GLUCOSE 114* 131*  BUN 25* 30*  CREATININE 1.86* 1.87*   No results found for this basename: TROPONINI:2,CK,MB:2 in the last 72 hours Hepatic Function Panel No results found for this basename: PROT,ALBUMIN,AST,ALT,ALKPHOS,BILITOT,BILIDIR,IBILI in the last 72 hours No results found for this basename: CHOL in the last 72 hours No results found for this basename: PROTIME in the last 72 hours  Imaging: Imaging results have been reviewed and No results found.  Cardiac Studies:  Assessment/Plan:  Status post recurrent the history T./atrial tachyarrhythmias status post attempted atrial tachycardia ablation Status post nonsustained V. tach asymptomatic Status  post recent anteroseptal wall myocardial infarction status post PCI to LAD resolving acute systolic heart failure multifactorial Resolving bilateral pneumonia COPD Hypertension History of tobacco abuse Morbid obesity Acute on chronic renal insufficiency stable Plan Add low-dose amiodarone per orders Add Aldactone per orders Check labs in a.m.  LOS: 8 days    Norman Rodgers N 01/24/2012, 4:14 PM

## 2012-01-25 LAB — COMPREHENSIVE METABOLIC PANEL
BUN: 21 mg/dL (ref 6–23)
Calcium: 8.4 mg/dL (ref 8.4–10.5)
GFR calc Af Amer: 44 mL/min — ABNORMAL LOW (ref >90)
Glucose, Bld: 105 mg/dL — ABNORMAL HIGH (ref 70–99)
Total Protein: 6.1 g/dL (ref 6.0–8.3)

## 2012-01-25 LAB — MAGNESIUM: Magnesium: 2.3 mg/dL (ref 1.5–2.5)

## 2012-01-25 LAB — CBC
HCT: 34 % — ABNORMAL LOW (ref 39.0–52.0)
Hemoglobin: 11.1 g/dL — ABNORMAL LOW (ref 13.0–17.0)
MCH: 29.8 pg (ref 26.0–34.0)
MCHC: 32.6 g/dL (ref 30.0–36.0)

## 2012-01-25 LAB — GLUCOSE, CAPILLARY

## 2012-01-25 NOTE — Progress Notes (Signed)
Pt received as a transfer from unit. Oriented to room VS done and placed on tele per nurse tech. Pt accompanied by wife. Marisa Cyphers RN

## 2012-01-25 NOTE — Progress Notes (Signed)
Attempted to ambulate pt. Pt stated he did not want to ambulate. Reminded pt of importance of walking multiple times a day per MD guidance. Pt sternly refused ambulation again. Pt did ask for RN. Alerted front desk staff to notify pt's RN per his request. Pt did not care to talk anymore at this time. Will follow up with pt. Electronically signed by Harriett Sine MS on Friday January 25 2012 at 1156

## 2012-01-25 NOTE — Progress Notes (Signed)
Subjective:  Patient denies any chest pain states breathing has improved No further episodes of the nonsustained VT or atrial tachyarrhythmias  Objective:  Vital Signs in the last 24 hours: Temp:  [97.7 F (36.5 C)-98.9 F (37.2 C)] 98.7 F (37.1 C) (02/22 0806) Pulse Rate:  [70] 70  (02/21 1940) Resp:  [18] 18  (02/22 0806) BP: (119-137)/(65-81) 121/65 mmHg (02/22 0806) SpO2:  [96 %-97 %] 96 % (02/22 0806)  Intake/Output from previous day: 02/21 0701 - 02/22 0700 In: 1180 [P.O.:1080; IV Piggyback:100] Out: 1825 [Urine:1825] Intake/Output from this shift: Total I/O In: -  Out: 550 [Urine:550]  Physical Exam: Neck: no adenopathy, no carotid bruit, no JVD and supple, symmetrical, trachea midline Lungs: Decreased breath sounds at bases with occasional rhonchi Heart: regular rate and rhythm, S1, S2 normal and Soft systolic murmur and S3 gallop noted Abdomen: soft, non-tender; bowel sounds normal; no masses,  no organomegaly Extremities: extremities normal, atraumatic, no cyanosis or edema  Lab Results:  Basename 01/25/12 0545 01/24/12 0608  WBC 9.9 9.9  HGB 11.1* 11.7*  PLT 157 135*    Basename 01/25/12 0545 01/24/12 0608  NA 141 137  K 3.9 3.9  CL 109 104  CO2 23 26  GLUCOSE 105* 114*  BUN 21 25*  CREATININE 1.82* 1.86*   No results found for this basename: TROPONINI:2,CK,MB:2 in the last 72 hours Hepatic Function Panel  Basename 01/25/12 0545  PROT 6.1  ALBUMIN 2.0*  AST 57*  ALT 72*  ALKPHOS 67  BILITOT 0.5  BILIDIR --  IBILI --   No results found for this basename: CHOL in the last 72 hours No results found for this basename: PROTIME in the last 72 hours  Imaging: Imaging results have been reviewed and No results found.  Cardiac Studies:  Assessment/Plan:  Status post recurrent SVT/atrial tachyarrhythmias status post attempted atrial tachycardia ablation Status post nonsustained VT asymptomatic History of recent anteroseptal wall myocardial  infarction status post PCI to LAD Ischemic cardiomyopathy Resolving acute systolic heart failure multifactorial COPD Resolving bilateral pneumonia Hypertension History of tobacco abuse Morbid obesity Acute on chronic renal insufficiency stable Plan Transfer to telemetry Check labs and x-ray in a.m. Continue ACE inhibitor for now may need to DC his renal function deteriorates  LOS: 9 days    Camran Keady N 01/25/2012, 1:25 PM

## 2012-01-26 ENCOUNTER — Inpatient Hospital Stay (HOSPITAL_COMMUNITY): Payer: 59

## 2012-01-26 LAB — CBC
Hemoglobin: 11.6 g/dL — ABNORMAL LOW (ref 13.0–17.0)
MCHC: 32.6 g/dL (ref 30.0–36.0)
RBC: 3.9 MIL/uL — ABNORMAL LOW (ref 4.22–5.81)
WBC: 9.4 10*3/uL (ref 4.0–10.5)

## 2012-01-26 LAB — BASIC METABOLIC PANEL
CO2: 23 mEq/L (ref 19–32)
GFR calc non Af Amer: 39 mL/min — ABNORMAL LOW (ref >90)
Glucose, Bld: 142 mg/dL — ABNORMAL HIGH (ref 70–99)
Potassium: 4.4 mEq/L (ref 3.5–5.1)
Sodium: 138 mEq/L (ref 135–145)

## 2012-01-26 MED ORDER — AMOXICILLIN-POT CLAVULANATE 875-125 MG PO TABS
1.0000 | ORAL_TABLET | Freq: Two times a day (BID) | ORAL | Status: DC
  Filled 2012-01-26 (×2): qty 1

## 2012-01-26 MED ORDER — AMIODARONE HCL 200 MG PO TABS: 200.0000 mg | ORAL_TABLET | Freq: Every day | ORAL | Status: AC

## 2012-01-26 MED ORDER — SPIRONOLACTONE 25 MG PO TABS: 25.0000 mg | ORAL_TABLET | Freq: Every day | ORAL | Status: DC

## 2012-01-26 MED ORDER — RAMIPRIL 2.5 MG PO CAPS: 5.0000 mg | ORAL_CAPSULE | Freq: Every day | ORAL | Status: AC

## 2012-01-26 MED ORDER — CARVEDILOL 12.5 MG PO TABS: 12.5000 mg | ORAL_TABLET | Freq: Two times a day (BID) | ORAL | Status: AC

## 2012-01-26 MED ORDER — NITROGLYCERIN 0.4 MG SL SUBL: 0.4000 mg | SUBLINGUAL_TABLET | SUBLINGUAL | Status: AC | PRN

## 2012-01-26 MED ORDER — AMOXICILLIN-POT CLAVULANATE 875-125 MG PO TABS: 1.0000 | ORAL_TABLET | Freq: Two times a day (BID) | ORAL | Status: AC

## 2012-01-26 MED ORDER — ROSUVASTATIN CALCIUM 40 MG PO TABS: 20.0000 mg | ORAL_TABLET | Freq: Every day | ORAL | Status: AC

## 2012-01-26 MED ORDER — TICAGRELOR 90 MG PO TABS: 90.0000 mg | ORAL_TABLET | Freq: Two times a day (BID) | ORAL | Status: DC

## 2012-01-26 MED ORDER — ASPIRIN 81 MG PO CHEW: 81.0000 mg | CHEWABLE_TABLET | Freq: Every day | ORAL | Status: AC

## 2012-01-26 NOTE — Discharge Instructions (Signed)
Arrhythmia Categories Your heart is a muscle that works to pump blood through your body by regular contractions. The beating of your heart is controlled by a system of special pacemaker cells. These cells control the electrical activity of the heart. When the system controlling this regular beating is disturbed, a heart rhythm abnormality (arrhythmia) results. WHEN YOUR HEART SKIPS A BEAT One of the most common and least serious heart arrhythmias is called an ectopic or premature atrial heartbeat (PAC). This may be noticed as a small change in your regular pulse. A PAC originates from the top part (atrium) of the heart. Within the right atrium, the SA node is the area that normally controls the regularity of the heart. PACs occur in heart tissue outside of the SA node region. You may feel this as a skipped beat or heart flutter, especially if several occur in succession or occur frequently.  Another arrhythmia is ventricular premature complex (VCP or PVC). These extra beats start out in the bottom, more muscular chambers of the heart. In most cases a PVC is harmless. If there are underlying causes that are making the heart irritable such as an overactive thyroid or a prior heart attack PVCs may be of more concern. In a few cases, medications to control the heart rhythm may be prescribed. Things to try at home:  Cut down or avoid alcohol, tobacco and caffeine.   Get enough sleep.   Reduce stress.   Exercise more.  WHEN THE HEART BEATS TOO FAST Atrial tachycardia is a fast heart rate, which starts out in the atrium. It may last from minutes to much longer. Your heart may beat 140 to 240 times per minute instead of the normal 60 to 100.  Symptoms include a worried feeling (anxiety) and a sense that your heart is beating fast and hard.   You may be able to stop the fast rate by holding your breath or bearing down as if you were going to have a bowel movement.   This type of fast rate is usually not  dangerous.  Atrial fibrillation and atrial flutter are other fast rhythms that start in the atria. Both conditions keep the atria from filling with enough blood so the heart does not work well.  Symptoms include feeling lightheaded or faint.   These fast rates may be the result of heart damage or disease. Too much thyroid hormone may play a role.   There may be no clear cause or it may be from heart disease or damage.   Medication or a special electrical treatment (cardioversion) may be needed to get the heart beating normally.  Ventricular tachycardia is a fast heart rate that starts in the lower muscular chambers (ventricles) This is a serious disorder that requires treatment as soon as possible. You need someone else to get and use a small defibrillator.  Symptoms include collapse, chest pain, or being short of breath.   Treatment may include medication, procedures to improve blood flow to the heart, or an implantable cardiac defibrillator (ICD).  DIAGNOSIS   A cardiogram (EKG or ECG) will be done to see the arrhythmia, as well as lab tests to check the underlying cause.   If the extra beats or fast rate come and go, you may wear a Holter monitor that records your heart rate for a longer period of time.  HOME CARE INSTRUCTIONS  If your caregiver has given you a follow-up appointment, it is very important to keep that appointment. Not keeping the   appointment could result in a heart attack, permanent injury, pain, and disability. If there is any problem keeping the appointment, you must call back to this facility for assistance.  SEEK MEDICAL CARE IF:  You have irregular or fast heartbeats (palpitations).   You experience skipped beats.   You develop lightheadedness.   You have chest discomfort.   You develop shortness of breath.   You have more frequent episodes, if you are already being treated.  SEEK IMMEDIATE MEDICAL CARE IF:   You have severe chest pain, especially if the  pain is crushing or pressure-like and spreads to the arms, back, neck, or jaw, or if you have sweating, feeling sick to your stomach (nausea), or shortness of breath. THIS IS AN EMERGENCY. Do not wait to see if the pain will go away. Get medical help at once. Call 911 or 0 (operator). DO NOT drive yourself to the hospital.   You feel dizzy or faint.   You have episodes of previously documented atrial tachycardia that do not resolve with the techniques your caregiver has taught you.   Irregular or rapid heartbeats begin to occur more often than in the past, especially if they are associated with more pronounced symptoms or of longer duration.  Document Released: 11/19/2005 Document Revised: 08/01/2011 Document Reviewed: 04/03/2007 ExitCare Patient Information 2012 ExitCare, LLCAngioplasty Care After Refer to this sheet in the next few weeks. These instructions provide you with information on caring for yourself after your procedure. Your caregiver may also give you more specific instructions. Your treatment has been planned according to current medical practices, but problems sometimes occur. Call your caregiver if you have any problems or questions after your procedure. HOME CARE INSTRUCTIONS  Take all medicines exactly as directed. You may need to keep taking blood thinners if they were prescribed for you.  Limit your activity for the first 48 hours. Avoid bending the place where the catheter was inserted.  Follow instructions about when you can drive or return to work.  SEEK IMMEDIATE MEDICAL CARE IF:  You develop chest pain, shortness of breath, feel faint, or pass out.  There is bleeding, swelling, or drainage from the catheter insertion site.  You develop pain, discoloration, coldness, or severe bruising in the leg or arm that held the catheter.  You develop bleeding from any other place such as from the bowels. There may be bright red blood in the urine or stools, or it may appear as  black, tarry stools.  An oral temperature above 102 F (38.9 C) develops.  MAKE SURE YOU:  Understand these instructions.  Will watch your condition.  Will get help right away if you are not doing well or get worse.  Document Released: 06/07/2005 Document Revised: 08/01/2011 Document Reviewed: 08/25/2008 [facility] Patient Information 2012 [place], [place].Marland Kitchen

## 2012-01-26 NOTE — Discharge Summary (Signed)
NAMEGUSTAVO, DISPENZA NO.:  [id number]  MEDICAL RECORD NO.:  [id number]  LOCATION:  4742                         FACILITY:  [facility]  PHYSICIAN:  Eduardo Osier. Sharyn Lull, M.D. DATE OF BIRTH:  09/04/1946  DATE OF ADMISSION:  01/16/2012 DATE OF DISCHARGE:  01/26/2012                              DISCHARGE SUMMARY   ADMITTING DIAGNOSES: 1. Recurrent paroxysmal supraventricular tachycardia. 2. Probable recent anteroseptal wall myocardial infarction. 3. Decompensated congestive heart failure, multifactorial i.e.     tachycardia induced - recent myocardial infarction - alcohol abuse     - hypertension. 4. History of tobacco abuse.  FINAL DIAGNOSES: 1. Status post recurrent supraventricular tachycardia/atrial     tachyarrhythmias, status post attempted atrial tachycardia     ablation. 2. Status post nonsustained ventricular tachycardia, asymptomatic. 3. History of recent anteroseptal wall myocardial infarction, status     post left catheterization and percutaneous transluminal coronary     angioplasty stenting to left anterior descending. 4. Ischemic cardiomyopathy. 5. Compensated systolic heart failure. 6. Chronic obstructive pulmonary disease. 7. Resolving bilateral pneumonia. 8. Hypertension. 9. History of tobacco abuse. 10.Morbid obesity. 11.Acute on chronic renal insufficiency, improved.  DISCHARGE HOME MEDICATIONS: 1. Amiodarone 200 mg 1 tablet daily. 2. Amoxicillin clavulanate 875/125 mg 1 tablet twice daily for 1 week. 3. Enteric-coated aspirin 81 mg 1 tablet daily. 4. Brilinta 90 mg 1 tablet twice daily. 5. Carvedilol 12.5 mg 1 tablet twice daily. 6. Ramipril 2.5 mg 2 capsules daily. 7. Nitrostat 0.4 mg sublingual use as directed. 8. Crestor 20 mg 1 tablet daily. 9. Aldactone 25 mg 1 tablet p.o. daily.  DIET:  Low salt, low cholesterol.  ACTIVITY:  Increase activity slowly as tolerated.  FOLLOWUP:  With me in 1 week.  The patient will be referred to  phase 2 cardiac rehab as outpatient.  We will repeat his 2D echo in 2-3 months.  If his EF remains below 35%, we will be referred back to EP for consideration for ICD implantation, and follow with Dr. Ladona Ridgel in 2 weeks with [facility].  CONDITION AT DISCHARGE:  Stable.  BRIEF HISTORY:  Mr. Beni is a 66 year old male with past medical history significant for tobacco abuse, alcohol abuse.  He came to the ER complaining of generalized malaise and weakness and shortness of breath for last 4 days, associated with palpitation.  The patient denies any chest pain, nausea, vomiting, or diaphoresis.  Denies history of PND, orthopnea, or leg swelling.  He states he drinks approximately 1 pint of hard liquor for the last 12 years.  Denies any cardiac workup in the recent past.  Denies any cough, fever, chills, or flu-like symptoms.  In the ER, the patient was noted to be in SVT with heart rate in 170s.  The patient initially received 20 mg IV Cardizem with conversion to sinus rhythm.  EKG done in the ER showed anteroseptal wall myocardial infarction, age indeterminate, and ST-T wave changes in inferolateral leads, which were new as compared to prior EKG.  The patient had multiple episodes of recurrent paroxysmal SVT requiring initially carotid massage with conversion to sinus rhythm and then IV Lopressor, IV Adenocard, and finally with IV verapamil with  conversion back to sinus rhythm.  The patient denies any chest pain, but continues to complain of shortness of breath and was noted to be in mild congestive heart failure.  His labs, his troponin I were minimally elevated so was D-dimer.  The patient did receive 8000 units of IV heparin bolus and was emergently taken to the CT scan for spiral CT of the chest to rule out PE, which was negative.  PAST MEDICAL HISTORY:  As above.  PHYSICAL EXAMINATION:  GENERAL:  He was alert, awake, in no acute distress. VITAL SIGNS:  Blood pressure was  102/68, pulse was 159, he was afebrile. HEENT:  Conjunctiva was pink. NECK:  Supple.  Positive JVD.  No bruit. CARDIOVASCULAR:  Normal rate and rhythm.  S1, S2 was normal.  There was soft systolic murmur and S3 gallop. LUNGS:  Decreased breath sounds at bases with bibasilar rales. ABDOMEN:  Soft.  Bowel sounds were present.  Nontender. EXTREMITIES:  There is no clubbing, cyanosis, or edema. NEURO:  Grossly intact.  LABORATORY DATA:  Hemoglobin 11.7, hematocrit 34.6, white count 8.8. Sodium 142, potassium 4.7, BUN 29, creatinine 1.40.  Cardiac enzymes, troponin I first set was slightly elevated at 0.15.  Repeat troponin I was less than 0.30.  Second set was 0.32.  CK total was 302, MB 6.3, which was elevated.  Next set of CK was 309, MB 8.0, which was also slightly elevated.  Next set CK 336, MB 10.3.  Next set CK 1068, MB 16.5, troponin I was 1.01.  His labs yesterday, hemoglobin was 11.6, hematocrit 35.6, white count of 9.4.  Sodium was 138, potassium 4.4, BUN 19, creatinine was trending down to 1.78, glucose was 142.  His yesterday fasting blood sugar was 105.  Chest x-ray today showed resolving bilateral pneumonia and resolving CHF.  BRIEF HOSPITAL COURSE:  The patient was admitted to CCU.  EP consultation was obtained for possible SVT ablation.  The patient subsequently had multiple episodes of SVT/atrial tachycardia during the hospital stay.  He subsequently underwent attempted radiofrequency ablation of atrial tachycardia.  Post procedure, the patient became somnolent requiring intubation to protect the airways.  The patient was subsequently extubated.  The patient was empirically treated for bilateral aspiration pneumonia.  Due to minimally elevated cardiac enzymes and heart failure and EKG changes suggestive of probable recent anteroseptal wall MI, the patient subsequently underwent a cardiac catheterization and PTA stenting to LAD as per procedure report.  The patient  tolerated the procedure well.  Post procedure, the patient did not have any episodes of chest pain.  During the hospital stay, the patient had 1 episode of nonsustained VT at 16 beats, but remained asymptomatic.  Amiodarone was restarted.  The patient has been ambulating in hallway without any problems.  The patient did not had any further episodes of atrial tachycardia.  The patient's both groins were stable.  The patient will be discharged home on above medications and will be followed up in my office in 1 week and Dr. Ladona Ridgel in 2 weeks, or sooner if he had recurrent episodes of atrial tachycardia.  We will repeat 2D echo in few months to evaluate the LV function.  If his EF remains below 35%, the patient will be referred for evaluation for ICD.     Eduardo Osier. Sharyn Lull, M.D.     [facility]/MEDQ  D:  01/26/2012  T:  01/26/2012  Job:  [id number]

## 2012-01-26 NOTE — Progress Notes (Signed)
Discussed discharge instuctions with pt verbalized understanding, d/c iv and tele. Taken down via wheelchiar to short stay. VSS

## 2012-01-26 NOTE — Progress Notes (Signed)
Intended to ambulate pt but pt was already off monitor and in process of being discharged. Education already completed. Will send phase II referral to [place]. Electronically signed by Harriett Sine MS on Saturday January 26 2012 at 1315 EST

## 2012-01-26 NOTE — Discharge Summary (Signed)
  Discharge summary dictated on 01/26/2012 dictation number is [phone]

## 2012-03-25 ENCOUNTER — Other Ambulatory Visit: Payer: Self-pay | Admitting: Cardiology

## 2012-05-26 ENCOUNTER — Other Ambulatory Visit: Payer: Self-pay | Admitting: Cardiology

## 2012-06-16 ENCOUNTER — Other Ambulatory Visit: Payer: Self-pay | Admitting: Cardiology

## 2012-07-24 ENCOUNTER — Other Ambulatory Visit: Payer: Self-pay | Admitting: Cardiology

## 2013-01-07 ENCOUNTER — Other Ambulatory Visit (HOSPITAL_COMMUNITY): Payer: Self-pay | Admitting: Cardiology

## 2013-01-07 DIAGNOSIS — R079 Chest pain, unspecified: Secondary | ICD-10-CM

## 2013-01-16 ENCOUNTER — Encounter (HOSPITAL_COMMUNITY): Payer: Medicare Other

## 2013-02-04 ENCOUNTER — Other Ambulatory Visit (HOSPITAL_COMMUNITY): Payer: Self-pay | Admitting: Cardiology

## 2013-02-13 ENCOUNTER — Encounter (HOSPITAL_COMMUNITY): Payer: Medicare Other

## 2013-02-18 ENCOUNTER — Other Ambulatory Visit: Payer: Self-pay

## 2013-02-18 ENCOUNTER — Encounter (HOSPITAL_COMMUNITY)
Admission: RE | Admit: 2013-02-18 | Discharge: 2013-02-18 | Disposition: A | Discharge status: Home / Self Care | Payer: BC Managed Care – PPO | Source: Ambulatory Visit | Attending: Cardiology | Admitting: Cardiology

## 2013-02-18 DIAGNOSIS — I2510 Atherosclerotic heart disease of native coronary artery without angina pectoris: Secondary | ICD-10-CM | POA: Insufficient documentation

## 2013-02-18 DIAGNOSIS — R079 Chest pain, unspecified: Secondary | ICD-10-CM | POA: Insufficient documentation

## 2013-02-18 DIAGNOSIS — I498 Other specified cardiac arrhythmias: Secondary | ICD-10-CM | POA: Insufficient documentation

## 2013-02-18 DIAGNOSIS — I509 Heart failure, unspecified: Secondary | ICD-10-CM | POA: Insufficient documentation

## 2013-02-18 MED ORDER — TECHNETIUM TC 99M SESTAMIBI GENERIC - CARDIOLITE
30.0000 | Freq: Once | INTRAVENOUS | Status: AC | PRN
  Administered 2013-02-18: 30 via INTRAVENOUS

## 2013-02-18 MED ORDER — REGADENOSON 0.4 MG/5ML IV SOLN
INTRAVENOUS | Status: AC
  Administered 2013-02-18: 0.4 mg
  Filled 2013-02-18: qty 5

## 2013-02-18 MED ORDER — TECHNETIUM TC 99M SESTAMIBI GENERIC - CARDIOLITE
10.0000 | Freq: Once | INTRAVENOUS | Status: AC | PRN
  Administered 2013-02-18: 10 via INTRAVENOUS

## 2014-11-11 ENCOUNTER — Encounter (HOSPITAL_COMMUNITY): Payer: Self-pay | Admitting: Internal Medicine

## 2016-04-11 DIAGNOSIS — I252 Old myocardial infarction: Secondary | ICD-10-CM | POA: Diagnosis not present

## 2016-04-11 DIAGNOSIS — I2510 Atherosclerotic heart disease of native coronary artery without angina pectoris: Secondary | ICD-10-CM | POA: Diagnosis not present

## 2016-04-11 DIAGNOSIS — I119 Hypertensive heart disease without heart failure: Secondary | ICD-10-CM | POA: Diagnosis not present

## 2016-04-11 DIAGNOSIS — E119 Type 2 diabetes mellitus without complications: Secondary | ICD-10-CM | POA: Diagnosis not present

## 2016-04-11 DIAGNOSIS — K219 Gastro-esophageal reflux disease without esophagitis: Secondary | ICD-10-CM | POA: Diagnosis not present

## 2016-07-19 ENCOUNTER — Other Ambulatory Visit: Payer: Self-pay | Admitting: Cardiology

## 2016-07-19 DIAGNOSIS — R0789 Other chest pain: Secondary | ICD-10-CM

## 2016-07-19 DIAGNOSIS — I252 Old myocardial infarction: Secondary | ICD-10-CM | POA: Diagnosis not present

## 2016-07-19 DIAGNOSIS — R0609 Other forms of dyspnea: Secondary | ICD-10-CM | POA: Diagnosis not present

## 2016-07-19 DIAGNOSIS — E119 Type 2 diabetes mellitus without complications: Secondary | ICD-10-CM | POA: Diagnosis not present

## 2016-07-19 DIAGNOSIS — I2510 Atherosclerotic heart disease of native coronary artery without angina pectoris: Secondary | ICD-10-CM | POA: Diagnosis not present

## 2016-07-19 DIAGNOSIS — I10 Essential (primary) hypertension: Secondary | ICD-10-CM | POA: Diagnosis not present

## 2016-07-26 DIAGNOSIS — R0609 Other forms of dyspnea: Secondary | ICD-10-CM | POA: Diagnosis not present

## 2016-07-26 DIAGNOSIS — I2510 Atherosclerotic heart disease of native coronary artery without angina pectoris: Secondary | ICD-10-CM | POA: Diagnosis not present

## 2016-07-26 DIAGNOSIS — I252 Old myocardial infarction: Secondary | ICD-10-CM | POA: Diagnosis not present

## 2016-07-26 DIAGNOSIS — I10 Essential (primary) hypertension: Secondary | ICD-10-CM | POA: Diagnosis not present

## 2016-07-26 DIAGNOSIS — E119 Type 2 diabetes mellitus without complications: Secondary | ICD-10-CM | POA: Diagnosis not present

## 2016-07-27 ENCOUNTER — Encounter (HOSPITAL_COMMUNITY)
Admission: RE | Admit: 2016-07-27 | Discharge: 2016-07-27 | Disposition: A | Discharge status: Home / Self Care | Payer: Medicare Other | Source: Ambulatory Visit | Attending: Cardiology | Admitting: Cardiology

## 2016-07-27 DIAGNOSIS — R079 Chest pain, unspecified: Secondary | ICD-10-CM | POA: Diagnosis not present

## 2016-07-27 DIAGNOSIS — R0602 Shortness of breath: Secondary | ICD-10-CM | POA: Diagnosis not present

## 2016-07-27 DIAGNOSIS — I252 Old myocardial infarction: Secondary | ICD-10-CM | POA: Diagnosis not present

## 2016-07-27 DIAGNOSIS — E119 Type 2 diabetes mellitus without complications: Secondary | ICD-10-CM | POA: Diagnosis not present

## 2016-07-27 DIAGNOSIS — R0789 Other chest pain: Secondary | ICD-10-CM | POA: Diagnosis not present

## 2016-07-27 DIAGNOSIS — R0609 Other forms of dyspnea: Secondary | ICD-10-CM | POA: Diagnosis not present

## 2016-07-27 DIAGNOSIS — I10 Essential (primary) hypertension: Secondary | ICD-10-CM | POA: Diagnosis not present

## 2016-07-27 DIAGNOSIS — I2510 Atherosclerotic heart disease of native coronary artery without angina pectoris: Secondary | ICD-10-CM | POA: Diagnosis not present

## 2016-07-27 LAB — BASIC METABOLIC PANEL
Anion gap: 7 (ref 5–15)
BUN: 13 mg/dL (ref 6–20)
CALCIUM: 9.2 mg/dL (ref 8.9–10.3)
CHLORIDE: 106 mmol/L (ref 101–111)
CO2: 23 mmol/L (ref 22–32)
CREATININE: 1.18 mg/dL (ref 0.61–1.24)
GFR calc Af Amer: >60 mL/min (ref >60)
GLUCOSE: 185 mg/dL — AB (ref 65–99)
Potassium: 4.4 mmol/L (ref 3.5–5.1)
Sodium: 136 mmol/L (ref 135–145)

## 2016-07-27 LAB — LIPID PANEL
CHOLESTEROL: 176 mg/dL (ref 0–200)
HDL: 30 mg/dL — AB (ref >40)
LDL Cholesterol: 118 mg/dL — ABNORMAL HIGH (ref 0–99)
TRIGLYCERIDES: 138 mg/dL (ref <150)
Total CHOL/HDL Ratio: 5.9 RATIO
VLDL: 28 mg/dL (ref 0–40)

## 2016-07-27 LAB — HEPATIC FUNCTION PANEL
ALT: 12 U/L — ABNORMAL LOW (ref 17–63)
AST: 18 U/L (ref 15–41)
Albumin: 3.1 g/dL — ABNORMAL LOW (ref 3.5–5.0)
Alkaline Phosphatase: 52 U/L (ref 38–126)
BILIRUBIN TOTAL: 0.5 mg/dL (ref 0.3–1.2)
Total Protein: 7.8 g/dL (ref 6.5–8.1)

## 2016-07-27 MED ORDER — TECHNETIUM TC 99M TETROFOSMIN IV KIT: 10.0000 | PACK | Freq: Once | INTRAVENOUS | Status: DC | PRN

## 2016-07-27 MED ORDER — REGADENOSON 0.4 MG/5ML IV SOLN
INTRAVENOUS | Status: AC
  Filled 2016-07-27: qty 5

## 2016-07-27 MED ORDER — REGADENOSON 0.4 MG/5ML IV SOLN
0.4000 mg | Freq: Once | INTRAVENOUS | Status: AC
  Administered 2016-07-27: 0.4 mg via INTRAVENOUS

## 2016-07-27 MED ORDER — TECHNETIUM TC 99M TETROFOSMIN IV KIT
30.0000 | PACK | Freq: Once | INTRAVENOUS | Status: AC | PRN
  Administered 2016-07-27: 30 via INTRAVENOUS

## 2016-07-31 LAB — HEMOGLOBIN A1C
HEMOGLOBIN A1C: 9 % — AB (ref 4.8–5.6)
Mean Plasma Glucose: 212 mg/dL

## 2016-11-14 DIAGNOSIS — I25118 Atherosclerotic heart disease of native coronary artery with other forms of angina pectoris: Secondary | ICD-10-CM | POA: Diagnosis not present

## 2016-11-14 DIAGNOSIS — I10 Essential (primary) hypertension: Secondary | ICD-10-CM | POA: Diagnosis not present

## 2016-11-14 DIAGNOSIS — I252 Old myocardial infarction: Secondary | ICD-10-CM | POA: Diagnosis not present

## 2016-11-14 DIAGNOSIS — E119 Type 2 diabetes mellitus without complications: Secondary | ICD-10-CM | POA: Diagnosis not present

## 2016-11-14 DIAGNOSIS — E785 Hyperlipidemia, unspecified: Secondary | ICD-10-CM | POA: Diagnosis not present

## 2016-12-23 ENCOUNTER — Encounter (HOSPITAL_COMMUNITY): Payer: Self-pay

## 2016-12-23 DIAGNOSIS — Z87891 Personal history of nicotine dependence: Secondary | ICD-10-CM | POA: Diagnosis not present

## 2016-12-23 DIAGNOSIS — I471 Supraventricular tachycardia: Secondary | ICD-10-CM | POA: Diagnosis present

## 2016-12-23 DIAGNOSIS — E1122 Type 2 diabetes mellitus with diabetic chronic kidney disease: Secondary | ICD-10-CM | POA: Diagnosis not present

## 2016-12-23 DIAGNOSIS — D62 Acute posthemorrhagic anemia: Secondary | ICD-10-CM | POA: Diagnosis present

## 2016-12-23 DIAGNOSIS — Z8249 Family history of ischemic heart disease and other diseases of the circulatory system: Secondary | ICD-10-CM | POA: Diagnosis not present

## 2016-12-23 DIAGNOSIS — I714 Abdominal aortic aneurysm, without rupture: Secondary | ICD-10-CM | POA: Diagnosis present

## 2016-12-23 DIAGNOSIS — N182 Chronic kidney disease, stage 2 (mild): Secondary | ICD-10-CM | POA: Diagnosis present

## 2016-12-23 DIAGNOSIS — K921 Melena: Secondary | ICD-10-CM | POA: Diagnosis not present

## 2016-12-23 DIAGNOSIS — I130 Hypertensive heart and chronic kidney disease with heart failure and stage 1 through stage 4 chronic kidney disease, or unspecified chronic kidney disease: Secondary | ICD-10-CM | POA: Diagnosis present

## 2016-12-23 DIAGNOSIS — J449 Chronic obstructive pulmonary disease, unspecified: Secondary | ICD-10-CM | POA: Diagnosis not present

## 2016-12-23 DIAGNOSIS — Z7902 Long term (current) use of antithrombotics/antiplatelets: Secondary | ICD-10-CM

## 2016-12-23 DIAGNOSIS — I110 Hypertensive heart disease with heart failure: Secondary | ICD-10-CM | POA: Diagnosis present

## 2016-12-23 DIAGNOSIS — R040 Epistaxis: Secondary | ICD-10-CM | POA: Diagnosis not present

## 2016-12-23 DIAGNOSIS — D649 Anemia, unspecified: Secondary | ICD-10-CM | POA: Diagnosis not present

## 2016-12-23 DIAGNOSIS — C787 Secondary malignant neoplasm of liver and intrahepatic bile duct: Secondary | ICD-10-CM | POA: Diagnosis not present

## 2016-12-23 DIAGNOSIS — C189 Malignant neoplasm of colon, unspecified: Secondary | ICD-10-CM | POA: Diagnosis not present

## 2016-12-23 DIAGNOSIS — E871 Hypo-osmolality and hyponatremia: Secondary | ICD-10-CM | POA: Diagnosis not present

## 2016-12-23 DIAGNOSIS — K5730 Diverticulosis of large intestine without perforation or abscess without bleeding: Secondary | ICD-10-CM | POA: Diagnosis not present

## 2016-12-23 DIAGNOSIS — Z800 Family history of malignant neoplasm of digestive organs: Secondary | ICD-10-CM | POA: Diagnosis not present

## 2016-12-23 DIAGNOSIS — I252 Old myocardial infarction: Secondary | ICD-10-CM

## 2016-12-23 DIAGNOSIS — F1010 Alcohol abuse, uncomplicated: Secondary | ICD-10-CM | POA: Diagnosis present

## 2016-12-23 DIAGNOSIS — Z7982 Long term (current) use of aspirin: Secondary | ICD-10-CM

## 2016-12-23 DIAGNOSIS — I2510 Atherosclerotic heart disease of native coronary artery without angina pectoris: Secondary | ICD-10-CM | POA: Diagnosis present

## 2016-12-23 DIAGNOSIS — Z955 Presence of coronary angioplasty implant and graft: Secondary | ICD-10-CM

## 2016-12-23 DIAGNOSIS — R531 Weakness: Secondary | ICD-10-CM | POA: Diagnosis not present

## 2016-12-23 DIAGNOSIS — Z66 Do not resuscitate: Secondary | ICD-10-CM | POA: Diagnosis not present

## 2016-12-23 DIAGNOSIS — K6389 Other specified diseases of intestine: Secondary | ICD-10-CM | POA: Diagnosis not present

## 2016-12-23 LAB — CBG MONITORING, ED: Glucose-Capillary: 135 mg/dL — ABNORMAL HIGH (ref 65–99)

## 2016-12-23 NOTE — ED Triage Notes (Signed)
Pt complaining of generalized weakness x months. Pt denies any focal neuro deficits. Pt a/o x 4. Pt denies any chest pain or SOB. Pt denies any cough or urinary symptoms.

## 2016-12-24 ENCOUNTER — Encounter (HOSPITAL_COMMUNITY): Payer: Self-pay | Admitting: Internal Medicine

## 2016-12-24 ENCOUNTER — Inpatient Hospital Stay (HOSPITAL_COMMUNITY)
Admission: EM | Admit: 2016-12-24 | Discharge: 2016-12-28 | DRG: 375 | Disposition: A | Discharge status: Home Health | Payer: Medicare Other | Attending: Internal Medicine | Admitting: Internal Medicine

## 2016-12-24 DIAGNOSIS — F1010 Alcohol abuse, uncomplicated: Secondary | ICD-10-CM | POA: Diagnosis present

## 2016-12-24 DIAGNOSIS — C801 Malignant (primary) neoplasm, unspecified: Secondary | ICD-10-CM | POA: Diagnosis not present

## 2016-12-24 DIAGNOSIS — D490 Neoplasm of unspecified behavior of digestive system: Secondary | ICD-10-CM | POA: Diagnosis not present

## 2016-12-24 DIAGNOSIS — Z955 Presence of coronary angioplasty implant and graft: Secondary | ICD-10-CM | POA: Diagnosis not present

## 2016-12-24 DIAGNOSIS — I2510 Atherosclerotic heart disease of native coronary artery without angina pectoris: Secondary | ICD-10-CM | POA: Diagnosis present

## 2016-12-24 DIAGNOSIS — I252 Old myocardial infarction: Secondary | ICD-10-CM | POA: Diagnosis not present

## 2016-12-24 DIAGNOSIS — I130 Hypertensive heart and chronic kidney disease with heart failure and stage 1 through stage 4 chronic kidney disease, or unspecified chronic kidney disease: Secondary | ICD-10-CM | POA: Diagnosis present

## 2016-12-24 DIAGNOSIS — N182 Chronic kidney disease, stage 2 (mild): Secondary | ICD-10-CM | POA: Diagnosis present

## 2016-12-24 DIAGNOSIS — D509 Iron deficiency anemia, unspecified: Secondary | ICD-10-CM | POA: Diagnosis not present

## 2016-12-24 DIAGNOSIS — D649 Anemia, unspecified: Secondary | ICD-10-CM | POA: Diagnosis not present

## 2016-12-24 DIAGNOSIS — I10 Essential (primary) hypertension: Secondary | ICD-10-CM | POA: Diagnosis not present

## 2016-12-24 DIAGNOSIS — I714 Abdominal aortic aneurysm, without rupture: Secondary | ICD-10-CM | POA: Diagnosis not present

## 2016-12-24 DIAGNOSIS — D500 Iron deficiency anemia secondary to blood loss (chronic): Secondary | ICD-10-CM | POA: Diagnosis not present

## 2016-12-24 DIAGNOSIS — Z7189 Other specified counseling: Secondary | ICD-10-CM | POA: Diagnosis not present

## 2016-12-24 DIAGNOSIS — C189 Malignant neoplasm of colon, unspecified: Secondary | ICD-10-CM

## 2016-12-24 DIAGNOSIS — C184 Malignant neoplasm of transverse colon: Secondary | ICD-10-CM | POA: Diagnosis not present

## 2016-12-24 DIAGNOSIS — Z800 Family history of malignant neoplasm of digestive organs: Secondary | ICD-10-CM | POA: Diagnosis not present

## 2016-12-24 DIAGNOSIS — D62 Acute posthemorrhagic anemia: Secondary | ICD-10-CM | POA: Diagnosis present

## 2016-12-24 DIAGNOSIS — Z7902 Long term (current) use of antithrombotics/antiplatelets: Secondary | ICD-10-CM | POA: Diagnosis not present

## 2016-12-24 DIAGNOSIS — K5730 Diverticulosis of large intestine without perforation or abscess without bleeding: Secondary | ICD-10-CM | POA: Diagnosis present

## 2016-12-24 DIAGNOSIS — E871 Hypo-osmolality and hyponatremia: Secondary | ICD-10-CM | POA: Diagnosis present

## 2016-12-24 DIAGNOSIS — Z87891 Personal history of nicotine dependence: Secondary | ICD-10-CM | POA: Diagnosis not present

## 2016-12-24 DIAGNOSIS — I110 Hypertensive heart disease with heart failure: Secondary | ICD-10-CM | POA: Diagnosis present

## 2016-12-24 DIAGNOSIS — R195 Other fecal abnormalities: Secondary | ICD-10-CM | POA: Diagnosis not present

## 2016-12-24 DIAGNOSIS — J449 Chronic obstructive pulmonary disease, unspecified: Secondary | ICD-10-CM | POA: Diagnosis not present

## 2016-12-24 DIAGNOSIS — R040 Epistaxis: Secondary | ICD-10-CM | POA: Diagnosis present

## 2016-12-24 DIAGNOSIS — C787 Secondary malignant neoplasm of liver and intrahepatic bile duct: Secondary | ICD-10-CM

## 2016-12-24 DIAGNOSIS — C182 Malignant neoplasm of ascending colon: Secondary | ICD-10-CM | POA: Diagnosis not present

## 2016-12-24 DIAGNOSIS — D124 Benign neoplasm of descending colon: Secondary | ICD-10-CM | POA: Diagnosis not present

## 2016-12-24 DIAGNOSIS — I5022 Chronic systolic (congestive) heart failure: Secondary | ICD-10-CM | POA: Diagnosis not present

## 2016-12-24 DIAGNOSIS — Z515 Encounter for palliative care: Secondary | ICD-10-CM | POA: Diagnosis not present

## 2016-12-24 DIAGNOSIS — K921 Melena: Secondary | ICD-10-CM | POA: Diagnosis present

## 2016-12-24 DIAGNOSIS — I471 Supraventricular tachycardia: Secondary | ICD-10-CM | POA: Diagnosis present

## 2016-12-24 DIAGNOSIS — K6389 Other specified diseases of intestine: Secondary | ICD-10-CM

## 2016-12-24 DIAGNOSIS — R531 Weakness: Secondary | ICD-10-CM | POA: Diagnosis present

## 2016-12-24 DIAGNOSIS — K639 Disease of intestine, unspecified: Secondary | ICD-10-CM | POA: Diagnosis not present

## 2016-12-24 DIAGNOSIS — E1122 Type 2 diabetes mellitus with diabetic chronic kidney disease: Secondary | ICD-10-CM | POA: Diagnosis present

## 2016-12-24 DIAGNOSIS — K922 Gastrointestinal hemorrhage, unspecified: Secondary | ICD-10-CM | POA: Diagnosis not present

## 2016-12-24 DIAGNOSIS — Z8249 Family history of ischemic heart disease and other diseases of the circulatory system: Secondary | ICD-10-CM | POA: Diagnosis not present

## 2016-12-24 DIAGNOSIS — Z7982 Long term (current) use of aspirin: Secondary | ICD-10-CM | POA: Diagnosis not present

## 2016-12-24 HISTORY — DX: Essential (primary) hypertension: I10

## 2016-12-24 HISTORY — DX: Type 2 diabetes mellitus without complications: E11.9

## 2016-12-24 LAB — CBG MONITORING, ED
GLUCOSE-CAPILLARY: 108 mg/dL — AB (ref 65–99)
Glucose-Capillary: 71 mg/dL (ref 65–99)

## 2016-12-24 LAB — CBC
HCT: 15.8 % — ABNORMAL LOW (ref 39.0–52.0)
HCT: 17.3 % — ABNORMAL LOW (ref 39.0–52.0)
HEMATOCRIT: 18.7 % — AB (ref 39.0–52.0)
HEMOGLOBIN: 5 g/dL — AB (ref 13.0–17.0)
HEMOGLOBIN: 5.3 g/dL — AB (ref 13.0–17.0)
Hemoglobin: 4 g/dL — CL (ref 13.0–17.0)
MCH: 15.8 pg — AB (ref 26.0–34.0)
MCH: 18.9 pg — ABNORMAL LOW (ref 26.0–34.0)
MCH: 19.2 pg — AB (ref 26.0–34.0)
MCHC: 25.3 g/dL — ABNORMAL LOW (ref 30.0–36.0)
MCHC: 28.3 g/dL — ABNORMAL LOW (ref 30.0–36.0)
MCHC: 28.9 g/dL — ABNORMAL LOW (ref 30.0–36.0)
MCV: 62.5 fL — AB (ref 78.0–100.0)
MCV: 66.5 fL — ABNORMAL LOW (ref 78.0–100.0)
MCV: 66.5 fL — ABNORMAL LOW (ref 78.0–100.0)
PLATELETS: 315 10*3/uL (ref 150–400)
Platelets: 331 10*3/uL (ref 150–400)
Platelets: 404 10*3/uL — ABNORMAL HIGH (ref 150–400)
RBC: 2.53 MIL/uL — AB (ref 4.22–5.81)
RBC: 2.6 MIL/uL — AB (ref 4.22–5.81)
RBC: 2.81 MIL/uL — AB (ref 4.22–5.81)
RDW: 20.4 % — ABNORMAL HIGH (ref 11.5–15.5)
RDW: 22.8 % — ABNORMAL HIGH (ref 11.5–15.5)
RDW: 23.3 % — ABNORMAL HIGH (ref 11.5–15.5)
WBC: 12.1 10*3/uL — ABNORMAL HIGH (ref 4.0–10.5)
WBC: 9.1 10*3/uL (ref 4.0–10.5)
WBC: 9.6 10*3/uL (ref 4.0–10.5)

## 2016-12-24 LAB — URINALYSIS, ROUTINE W REFLEX MICROSCOPIC
Bilirubin Urine: NEGATIVE
GLUCOSE, UA: NEGATIVE mg/dL
HGB URINE DIPSTICK: NEGATIVE
Ketones, ur: NEGATIVE mg/dL
Leukocytes, UA: NEGATIVE
Nitrite: NEGATIVE
Protein, ur: NEGATIVE mg/dL
Specific Gravity, Urine: 1.013 (ref 1.005–1.030)
pH: 5 (ref 5.0–8.0)

## 2016-12-24 LAB — BASIC METABOLIC PANEL
ANION GAP: 17 — AB (ref 5–15)
ANION GAP: 8 (ref 5–15)
BUN: 23 mg/dL — ABNORMAL HIGH (ref 6–20)
BUN: 25 mg/dL — ABNORMAL HIGH (ref 6–20)
CHLORIDE: 101 mmol/L (ref 101–111)
CHLORIDE: 97 mmol/L — AB (ref 101–111)
CO2: 17 mmol/L — AB (ref 22–32)
CO2: 22 mmol/L (ref 22–32)
Calcium: 8.1 mg/dL — ABNORMAL LOW (ref 8.9–10.3)
Calcium: 8.5 mg/dL — ABNORMAL LOW (ref 8.9–10.3)
Creatinine, Ser: 1.46 mg/dL — ABNORMAL HIGH (ref 0.61–1.24)
Creatinine, Ser: 1.7 mg/dL — ABNORMAL HIGH (ref 0.61–1.24)
GFR calc Af Amer: 55 mL/min — ABNORMAL LOW (ref >60)
GFR calc non Af Amer: 39 mL/min — ABNORMAL LOW (ref >60)
GFR calc non Af Amer: 47 mL/min — ABNORMAL LOW (ref >60)
GFR, EST AFRICAN AMERICAN: 46 mL/min — AB (ref >60)
Glucose, Bld: 187 mg/dL — ABNORMAL HIGH (ref 65–99)
Glucose, Bld: 99 mg/dL (ref 65–99)
POTASSIUM: 4.1 mmol/L (ref 3.5–5.1)
Potassium: 3.6 mmol/L (ref 3.5–5.1)
SODIUM: 131 mmol/L — AB (ref 135–145)
Sodium: 131 mmol/L — ABNORMAL LOW (ref 135–145)

## 2016-12-24 LAB — GLUCOSE, CAPILLARY
Glucose-Capillary: 84 mg/dL (ref 65–99)
Glucose-Capillary: 107 mg/dL — ABNORMAL HIGH (ref 65–99)

## 2016-12-24 LAB — HEMOGLOBIN AND HEMATOCRIT, BLOOD
HCT: 15.8 % — ABNORMAL LOW (ref 39.0–52.0)
HEMOGLOBIN: 4 g/dL — AB (ref 13.0–17.0)

## 2016-12-24 LAB — ABO/RH: ABO/RH(D): AB POS

## 2016-12-24 LAB — POC OCCULT BLOOD, ED: Fecal Occult Bld: POSITIVE — AB

## 2016-12-24 LAB — PREPARE RBC (CROSSMATCH)

## 2016-12-24 MED ORDER — SODIUM CHLORIDE 0.9 % IV SOLN: 10.0000 mL/h | Freq: Once | INTRAVENOUS | Status: DC

## 2016-12-24 MED ORDER — ACETAMINOPHEN 325 MG PO TABS
650.0000 mg | ORAL_TABLET | Freq: Four times a day (QID) | ORAL | Status: DC | PRN
  Administered 2016-12-24 – 2016-12-25 (×3): 650 mg via ORAL
  Filled 2016-12-24 (×3): qty 2

## 2016-12-24 MED ORDER — FUROSEMIDE 10 MG/ML IJ SOLN
20.0000 mg | Freq: Once | INTRAMUSCULAR | Status: AC
  Administered 2016-12-24: 20 mg via INTRAVENOUS
  Filled 2016-12-24: qty 2

## 2016-12-24 MED ORDER — ACETAMINOPHEN 325 MG PO TABS
650.0000 mg | ORAL_TABLET | Freq: Once | ORAL | Status: AC
  Administered 2016-12-24: 650 mg via ORAL
  Filled 2016-12-24: qty 2

## 2016-12-24 MED ORDER — PEG 3350-KCL-NA BICARB-NACL 420 G PO SOLR
4000.0000 mL | Freq: Once | ORAL | Status: AC
  Administered 2016-12-24: 4000 mL via ORAL
  Filled 2016-12-24: qty 4000

## 2016-12-24 MED ORDER — ONDANSETRON HCL 4 MG/2ML IJ SOLN: 4.0000 mg | Freq: Four times a day (QID) | INTRAMUSCULAR | Status: DC | PRN

## 2016-12-24 MED ORDER — CARVEDILOL 12.5 MG PO TABS
12.5000 mg | ORAL_TABLET | Freq: Two times a day (BID) | ORAL | Status: DC
Start: 2016-12-24 — End: 2016-12-28
  Administered 2016-12-25 – 2016-12-28 (×6): 12.5 mg via ORAL
  Filled 2016-12-24 (×6): qty 1

## 2016-12-24 MED ORDER — PANTOPRAZOLE SODIUM 40 MG IV SOLR
40.0000 mg | Freq: Two times a day (BID) | INTRAVENOUS | Status: DC
  Administered 2016-12-24 – 2016-12-28 (×8): 40 mg via INTRAVENOUS
  Filled 2016-12-24 (×8): qty 40

## 2016-12-24 MED ORDER — ACETAMINOPHEN 650 MG RE SUPP: 650.0000 mg | Freq: Four times a day (QID) | RECTAL | Status: DC | PRN

## 2016-12-24 MED ORDER — DIPHENHYDRAMINE HCL 25 MG PO CAPS
25.0000 mg | ORAL_CAPSULE | Freq: Once | ORAL | Status: AC
  Administered 2016-12-24: 25 mg via ORAL
  Filled 2016-12-24: qty 1

## 2016-12-24 MED ORDER — SODIUM CHLORIDE 0.9 % IV BOLUS (SEPSIS)
1000.0000 mL | Freq: Once | INTRAVENOUS | Status: AC
  Administered 2016-12-24: 1000 mL via INTRAVENOUS

## 2016-12-24 MED ORDER — ROSUVASTATIN CALCIUM 20 MG PO TABS
20.0000 mg | ORAL_TABLET | Freq: Every day | ORAL | Status: DC
Start: 2016-12-24 — End: 2016-12-28
  Administered 2016-12-24 – 2016-12-27 (×4): 20 mg via ORAL
  Filled 2016-12-24 (×6): qty 1

## 2016-12-24 MED ORDER — INSULIN ASPART 100 UNIT/ML ~~LOC~~ SOLN
0.0000 [IU] | SUBCUTANEOUS | Status: DC
  Administered 2016-12-25: 1 [IU] via SUBCUTANEOUS
  Administered 2016-12-26: 2 [IU] via SUBCUTANEOUS
  Administered 2016-12-26: 1 [IU] via SUBCUTANEOUS
  Administered 2016-12-26: 2 [IU] via SUBCUTANEOUS
  Administered 2016-12-27: 1 [IU] via SUBCUTANEOUS
  Administered 2016-12-27: 2 [IU] via SUBCUTANEOUS

## 2016-12-24 MED ORDER — RAMIPRIL 2.5 MG PO CAPS
5.0000 mg | ORAL_CAPSULE | Freq: Every day | ORAL | Status: DC
  Administered 2016-12-25 – 2016-12-28 (×3): 5 mg via ORAL
  Filled 2016-12-24 (×3): qty 2
  Filled 2016-12-24: qty 1

## 2016-12-24 MED ORDER — NITROGLYCERIN 0.4 MG SL SUBL: 0.4000 mg | SUBLINGUAL_TABLET | SUBLINGUAL | Status: DC | PRN

## 2016-12-24 MED ORDER — ONDANSETRON HCL 4 MG PO TABS: 4.0000 mg | ORAL_TABLET | Freq: Four times a day (QID) | ORAL | Status: DC | PRN

## 2016-12-24 MED ORDER — SODIUM CHLORIDE 0.9 % IV SOLN: Freq: Once | INTRAVENOUS | Status: DC

## 2016-12-24 MED ORDER — AMIODARONE HCL 200 MG PO TABS
200.0000 mg | ORAL_TABLET | Freq: Every day | ORAL | Status: DC
  Administered 2016-12-25 – 2016-12-28 (×3): 200 mg via ORAL
  Filled 2016-12-24 (×3): qty 1

## 2016-12-24 NOTE — ED Notes (Signed)
Patient states that he wants to go home and not stay here tonight

## 2016-12-24 NOTE — H&P (Signed)
History and Physical    [name] [id number] DOB: [birth year] DOA: 12/24/2016  PCP: Patricia Nettle, MD  Patient coming from: Home.  Chief Complaint: Weakness and shortness of breath.  HPI: Norman Rodgers is a 71 y.o. male with history of CAD status post stenting, systolic CHF with last EF measured was 20% in 2013, diabetes mellitus type 2, hypertension presents to the ER because of increasing fatigue and weakness shortness of breath on exertion. Patient has been having these symptoms for last 1 month. Denies any chest pain or productive cough. 3 days ago patient had large amount of epistaxis. In the ER patient's hemoglobin was found to be about 4 and stool for occult blood was positive but not melanotic. Patient is being admitted for severe symptomatic anemia and GI bleed. Patient denies taking any NSAIDs other than aspirin.  Patient also has noticed increasing loss of weight.  ED Course: Hemoglobin was 4. Stool for occult blood was positive. 2 units of packed red blood cell transfusion has been ordered.  Review of Systems: As per HPI, rest all negative.   Past Medical History:  Diagnosis Date  . Diabetes mellitus without complication ([place])   . Hypertension     Past Surgical History:  Procedure Laterality Date  . LEFT HEART CATHETERIZATION WITH CORONARY ANGIOGRAM N/A 01/22/2012   Procedure: LEFT HEART CATHETERIZATION WITH CORONARY ANGIOGRAM;  Surgeon: Clent Demark, MD;  Location: [facility] CATH LAB;  Service: Cardiovascular;  Laterality: N/A;  . SUPRAVENTRICULAR TACHYCARDIA ABLATION N/A 01/17/2012   Procedure: SUPRAVENTRICULAR TACHYCARDIA ABLATION;  Surgeon: Evans Lance, MD;  Location: [facility] CATH LAB;  Service: Cardiovascular;  Laterality: N/A;     reports that he has never smoked. He has never used smokeless tobacco. He reports that he drinks alcohol. He reports that he does not use drugs.  No Known Allergies  Family History  Problem Relation Age of Onset  . Hypertension  Other     Prior to Admission medications   Medication Sig Start Date End Date Taking? Authorizing Provider  amiodarone (PACERONE) 200 MG tablet Take 1 tablet (200 mg total) by mouth daily. 01/26/12 12/24/16 Yes Charolette Forward, MD  aspirin EC 81 MG tablet Take 81 mg by mouth daily.   Yes Historical Provider, MD  carvedilol (COREG) 12.5 MG tablet Take 1 tablet (12.5 mg total) by mouth 2 (two) times daily with a meal. 01/26/12 12/24/16 Yes Charolette Forward, MD  glimepiride (AMARYL) 4 MG tablet Take 4 mg by mouth daily with breakfast.   Yes Historical Provider, MD  nitroGLYCERIN (NITROSTAT) 0.4 MG SL tablet Place 1 tablet (0.4 mg total) under the tongue every 5 (five) minutes x 3 doses as needed for chest pain. 01/26/12 12/24/16 Yes Charolette Forward, MD  ramipril (ALTACE) 2.5 MG capsule Take 2 capsules (5 mg total) by mouth daily. 01/26/12 12/24/16 Yes Charolette Forward, MD  rosuvastatin (CRESTOR) 40 MG tablet Take 0.5 tablets (20 mg total) by mouth daily at 6 PM. 01/26/12 12/24/16 Yes Charolette Forward, MD    Physical Exam: Vitals:   12/24/16 0330 12/24/16 0345 12/24/16 0400 12/24/16 0517  BP: 128/63 124/61 124/66 122/60  Pulse: 77 76 72 74  Resp: 18 19 19 22   Temp:    99.9 F (37.7 C)  TempSrc:    Oral  SpO2: 100% 99% 98% 97%      Constitutional: Moderately built and nourished. Vitals:   12/24/16 0330 12/24/16 0345 12/24/16 0400 12/24/16 0517  BP: 128/63 124/61 124/66 122/60  Pulse:  77 76 72 74  Resp: 18 19 19 22   Temp:    99.9 F (37.7 C)  TempSrc:    Oral  SpO2: 100% 99% 98% 97%   Eyes: Anicteric mild pallor. ENMT: No discharge from the ears eyes nose or mouth. Neck: No mass felt. No JVD appreciated. Respiratory: No rhonchi or crepitations. Cardiovascular: S1 and S2 heard no murmurs appreciated. Abdomen: Soft nontender bowel sounds present. No guarding or rigidity. Musculoskeletal: No edema. No joint effusion. Skin: Appears pale. Appears warm. Neurologic: Alert awake oriented to time place and  person. Moves all extremities. Psychiatric: Appears normal. Normal affect.   Labs on Admission: I have personally reviewed following labs and imaging studies  CBC:  Recent Labs Lab 12/23/16 2347 12/24/16 0101  WBC 12.1*  --   HGB 4.0* 4.0*  HCT 15.8* 15.8*  MCV 62.5*  --   PLT 404*  --    Basic Metabolic Panel:  Recent Labs Lab 12/23/16 2347  NA 131*  K 4.1  CL 97*  CO2 17*  GLUCOSE 187*  BUN 23*  CREATININE 1.70*  CALCIUM 8.5*   GFR: CrCl cannot be calculated (Unknown ideal weight.). Liver Function Tests: No results for input(s): AST, ALT, ALKPHOS, BILITOT, PROT, ALBUMIN in the last 168 hours. No results for input(s): LIPASE, AMYLASE in the last 168 hours. No results for input(s): AMMONIA in the last 168 hours. Coagulation Profile: No results for input(s): INR, PROTIME in the last 168 hours. Cardiac Enzymes: No results for input(s): CKTOTAL, CKMB, CKMBINDEX, TROPONINI in the last 168 hours. BNP (last 3 results) No results for input(s): PROBNP in the last 8760 hours. HbA1C: No results for input(s): HGBA1C in the last 72 hours. CBG:  Recent Labs Lab 12/23/16 2356  GLUCAP 135*   Lipid Profile: No results for input(s): CHOL, HDL, LDLCALC, TRIG, CHOLHDL, LDLDIRECT in the last 72 hours. Thyroid Function Tests: No results for input(s): TSH, T4TOTAL, FREET4, T3FREE, THYROIDAB in the last 72 hours. Anemia Panel: No results for input(s): VITAMINB12, FOLATE, FERRITIN, TIBC, IRON, RETICCTPCT in the last 72 hours. Urine analysis: No results found for: COLORURINE, APPEARANCEUR, LABSPEC, PHURINE, GLUCOSEU, HGBUR, BILIRUBINUR, KETONESUR, PROTEINUR, UROBILINOGEN, NITRITE, LEUKOCYTESUR Sepsis Labs: @LABRCNTIP (procalcitonin:4,lacticidven:4) )No results found for this or any previous visit (from the past 240 hour(s)).   Radiological Exams on Admission: No results found.  EKG: Independently reviewed. Normal sinus rhythm.  Assessment/Plan Active Problems:   CAD  (coronary artery disease)   Symptomatic anemia   Chronic systolic CHF (congestive heart failure) (HCC)   GI bleeding   Hypertension   Acute GI bleeding    1. GI bleed probably acute on chronic - patient will be placed nothing by mouth in anticipation of possible EGD and colonoscopy. Consult gastroenterologist in a.m. I have placed patient on Protonix. Discontinue aspirin. Transfuse PRBC. Patient is hemodynamically stable. 2. Symptomatic severe anemia - microcytic hypochromic. Transfusing 2 units of packed blood cells. Recheck CBC after transfusion. 3. Epistaxis - not sure if one episode of epistaxis alone could account for severe anemia. GI workup is definitely warranted given the severity of anemia. 4. CAD status post stenting - holding of aspirin due to acute blood loss anemia. Continue statins and Coreg. 5. Diabetes mellitus type 2 - holding oral anti-hypoglycemics. CBG with sliding-scale coverage ordered. 6. Chronic systolic heart failure last EF measured in 2013 was 20% - patient had recently followed up with cardiologist and was told that his heart studies are been doing well. 7. History of SVT - patient  on amiodarone.   DVT prophylaxis: SCDs. Code Status: Full code.  Family Communication: Patient's wife.  Disposition Plan: Home.  Consults called: None.  Admission status: Inpatient.    Rise Patience MD [facility] Pager [phone].  If 7PM-7AM, please contact night-coverage [email or web address] Password [facility]  12/24/2016, 5:31 AM

## 2016-12-24 NOTE — Progress Notes (Signed)
Patient trasfered from ED to [phone] via stretcher; alert and oriented x 4; no complaints of pain; IV saline locked in RFA and LH. Orient patient to room and unit; gave patient care guide; instructed how to use the call bell and  fall risk precautions. Will continue to monitor the patient.

## 2016-12-24 NOTE — Progress Notes (Signed)
PROGRESS NOTE    Norman Rodgers  [id number] DOB: [birth year] DOA: 12/24/2016 PCP: Terrence Dupont  Brief Narrative:   71 y/o ? 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   episodoic epistaxis Hemoccult + On asa     Assessment & Plan:   Active Problems:   CAD (coronary artery disease)   Symptomatic anemia   Chronic systolic CHF (congestive heart failure) (HCC)   GI bleeding   Hypertension   Acute GI bleeding   ?Acute GIB-Dr Collene Mares consutled-await recs-ok for clear.  On protonix 28 .  Hemoglobin only 5 Transfuse 2 more U PRBC MI with stent-asa 81 held Epistaxis-1 episode with no recurrence-patient not complaining of ny psvt-amio 200 daily.  Cont coreg 12.5 bid Htn-ramipril 5 daily for htn--Ok to use as not hypotensive Prior ETOh monitor   Objective: Vitals:   12/24/16 0654 12/24/16 0700 12/24/16 0730 12/24/16 0737  BP:  132/65 121/61   Pulse:  67 69   Resp:  (!) 29 19   Temp: 99.4 F (37.4 C)   98.9 F (37.2 C)  TempSrc: Oral   Oral  SpO2:  98% 98%     Intake/Output Summary (Last 24 hours) at 12/24/16 0747 Last data filed at 12/24/16 0445  Gross per 24 hour  Intake           1340.5 ml  Output                0 ml  Net           1340.5 ml   There were no vitals filed for this visit.  Examination:  General exam: Appears calm e  Respiratory system: Clear to auscultation. Respiratory effort normal. Cardiovascular system: S1 & S2 heard, RRR. No JVD, murmurs Gastrointestinal system: Abdomen is nondistended, soft and nontender. No organomegaly or masses felt. Normal bowel sounds heard. Central nervous system: Alert and oriented. No focal neurological deficits.   Data Reviewed: I have personally reviewed following labs and imaging studies  CBC:  Recent Labs Lab 12/23/16 2347 12/24/16 0101  WBC 12.1*  --   HGB 4.0* 4.0*  HCT 15.8* 15.8*  MCV 62.5*  --   PLT 404*  --    Basic  Metabolic Panel:  Recent Labs Lab 12/23/16 2347  NA 131*  K 4.1  CL 97*  CO2 17*  GLUCOSE 187*  BUN 23*  CREATININE 1.70*  CALCIUM 8.5*   GFR: CrCl cannot be calculated (Unknown ideal weight.). Liver Function Tests: No results for input(s): AST, ALT, ALKPHOS, BILITOT, PROT, ALBUMIN in the last 168 hours. No results for input(s): LIPASE, AMYLASE in the last 168 hours. No results for input(s): AMMONIA in the last 168 hours. Coagulation Profile: No results for input(s): INR, PROTIME in the last 168 hours. Cardiac Enzymes: No results for input(s): CKTOTAL, CKMB, CKMBINDEX, TROPONINI in the last 168 hours. BNP (last 3 results) No results for input(s): PROBNP in the last 8760 hours. HbA1C: No results for input(s): HGBA1C in the last 72 hours. CBG:  Recent Labs Lab 12/23/16 2356  GLUCAP 135*   Lipid Profile: No results for input(s): CHOL, HDL, LDLCALC, TRIG, CHOLHDL, LDLDIRECT in the last 72 hours. Thyroid Function Tests: No results for input(s): TSH, T4TOTAL, FREET4, T3FREE, THYROIDAB in the last 72 hours. Anemia Panel: No results for input(s): VITAMINB12, FOLATE, FERRITIN, TIBC, IRON, RETICCTPCT in the last 72 hours. Sepsis Labs: No results for input(s):  PROCALCITON, LATICACIDVEN in the last 168 hours.  No results found for this or any previous visit (from the past 240 hour(s)).       Radiology Studies: No results found.      Scheduled Meds: . amiodarone  200 mg Oral Daily  . carvedilol  12.5 mg Oral BID WC  . insulin aspart  0-9 Units Subcutaneous Q4H  . pantoprazole  40 mg Intravenous Q12H  . ramipril  5 mg Oral Daily  . rosuvastatin  20 mg Oral q1800   Continuous Infusions: . sodium chloride       LOS: 0 days    Time spent: Bentley, [place], MD [facility] Pager [phone]  If 7PM-7AM, please contact night-coverage [email or web address] Password [facility] 12/24/2016, 7:47 AM

## 2016-12-24 NOTE — ED Provider Notes (Signed)
[facility] DEPT Provider Note   CSN: [id number] Arrival date & time: 12/23/16  2331 By signing my name below, I, Georgette Shell, attest that this documentation has been prepared under the direction and in the presence of Merryl Hacker, MD. Electronically Signed: Georgette Shell, ED Scribe. 12/24/16. 1:02 AM.  History   Chief Complaint Chief Complaint  Patient presents with  . Weakness    HPI The history is provided by the patient and the spouse. No language interpreter was used.   HPI Comments: Norman Rodgers is a 71 y.o. male with h/o HTN, DM, CHF, CAD, SVT, and alcohol abuse, who presents to the Emergency Department complaining of generalized weakness onset a couple months ago, worsening one week ago. Pt also has associated dizziness that is reproducible with standing and sudden movement. Per wife, pt has had decreased appetite, unexpected weight loss, and x1 episode of epistaxis one day ago. Pt has not had a bowel movement in a couple days which he attributes to him not eating. Pt denies fever, chills, hematochezia, melena, abdominal pain, chest pain, shortness of breath, focal weakness, or any other associated symptoms. Denies lateralizing weakness.  Past Medical History:  Diagnosis Date  . Diabetes mellitus without complication ([place])   . Hypertension     Patient Active Problem List   Diagnosis Date Noted  . CHF (congestive heart failure) ([place]) 01/17/2012  . Respiratory failure ([place]) 01/17/2012  . CAD (coronary artery disease) 01/17/2012  . Pneumonia 01/17/2012  . Alcohol abuse 01/17/2012  . SVT (supraventricular tachycardia) ([place]) 01/16/2012    Past Surgical History:  Procedure Laterality Date  . LEFT HEART CATHETERIZATION WITH CORONARY ANGIOGRAM N/A 01/22/2012   Procedure: LEFT HEART CATHETERIZATION WITH CORONARY ANGIOGRAM;  Surgeon: Clent Demark, MD;  Location: [facility] CATH LAB;  Service: Cardiovascular;  Laterality: N/A;  . SUPRAVENTRICULAR TACHYCARDIA ABLATION N/A  01/17/2012   Procedure: SUPRAVENTRICULAR TACHYCARDIA ABLATION;  Surgeon: Evans Lance, MD;  Location: [facility] CATH LAB;  Service: Cardiovascular;  Laterality: N/A;       Home Medications    Prior to Admission medications   Medication Sig Start Date End Date Taking? Authorizing Provider  amiodarone (PACERONE) 200 MG tablet Take 1 tablet (200 mg total) by mouth daily. 01/26/12 01/25/13  Charolette Forward, MD  carvedilol (COREG) 12.5 MG tablet Take 1 tablet (12.5 mg total) by mouth 2 (two) times daily with a meal. 01/26/12 01/25/13  Charolette Forward, MD  nitroGLYCERIN (NITROSTAT) 0.4 MG SL tablet Place 1 tablet (0.4 mg total) under the tongue every 5 (five) minutes x 3 doses as needed for chest pain. 01/26/12 01/25/13  Charolette Forward, MD  ramipril (ALTACE) 2.5 MG capsule Take 2 capsules (5 mg total) by mouth daily. 01/26/12 01/25/13  Charolette Forward, MD  rosuvastatin (CRESTOR) 40 MG tablet Take 0.5 tablets (20 mg total) by mouth daily at 6 PM. 01/26/12 01/25/13  Charolette Forward, MD  spironolactone (ALDACTONE) 25 MG tablet Take 1 tablet (25 mg total) by mouth daily. 01/26/12 01/25/13  Charolette Forward, MD  Ticagrelor (BRILINTA) 90 MG TABS tablet Take 1 tablet (90 mg total) by mouth 2 (two) times daily. 01/26/12   Charolette Forward, MD    Family History History reviewed. No pertinent family history.  Social History Social History  Substance Use Topics  . Smoking status: Never Smoker  . Smokeless tobacco: Never Used  . Alcohol use Yes     Allergies   Patient has no known allergies.   Review of Systems Review of Systems  Constitutional: Positive for appetite change and unexpected weight change. Negative for chills and fever.  HENT: Positive for nosebleeds.   Respiratory: Negative for shortness of breath.   Cardiovascular: Negative for chest pain.  Gastrointestinal: Negative for blood in stool.  Neurological: Positive for dizziness and weakness.  All other systems reviewed and are negative.    Physical  Exam Updated Vital Signs BP 110/66   Pulse 75   Temp 98.4 F (36.9 C) (Oral)   Resp (!) 29   SpO2 96%   Physical Exam  Constitutional: He is oriented to person, place, and time.  Ill-appearing but nontoxic  HENT:  Pale conjunctiva  Eyes: Pupils are equal, round, and reactive to light.  Cardiovascular: Normal rate, regular rhythm and normal heart sounds.   No murmur heard. Pulmonary/Chest: Effort normal and breath sounds normal. No respiratory distress. He has no wheezes.  Abdominal: Soft. Bowel sounds are normal. There is no tenderness. There is no rebound.  Ventral hernia noted.  Genitourinary: Rectal exam shows guaiac positive stool.  Genitourinary Comments: Normal rectal tone, no gross blood, brown stool  Musculoskeletal: He exhibits no edema.  Neurological: He is alert and oriented to person, place, and time.  Skin: Skin is warm and dry.  Psychiatric: He has a normal mood and affect.  Nursing note and vitals reviewed.    ED Treatments / Results  DIAGNOSTIC STUDIES: Oxygen Saturation is 94% on RA, poor by my interpretation.    COORDINATION OF CARE: 1:01 AM Discussed treatment plan with pt at bedside and pt agreed to plan.  Labs (all labs ordered are listed, but only abnormal results are displayed) Labs Reviewed  BASIC METABOLIC PANEL - Abnormal; Notable for the following:       Result Value   Sodium 131 (*)    Chloride 97 (*)    CO2 17 (*)    Glucose, Bld 187 (*)    BUN 23 (*)    Creatinine, Ser 1.70 (*)    Calcium 8.5 (*)    GFR calc non Af Amer 39 (*)    GFR calc Af Amer 46 (*)    Anion gap 17 (*)    All other components within normal limits  CBC - Abnormal; Notable for the following:    WBC 12.1 (*)    RBC 2.53 (*)    Hemoglobin 4.0 (*)    HCT 15.8 (*)    MCV 62.5 (*)    MCH 15.8 (*)    MCHC 25.3 (*)    RDW 20.4 (*)    Platelets 404 (*)    All other components within normal limits  HEMOGLOBIN AND HEMATOCRIT, BLOOD - Abnormal; Notable for the  following:    Hemoglobin 4.0 (*)    HCT 15.8 (*)    All other components within normal limits  CBG MONITORING, ED - Abnormal; Notable for the following:    Glucose-Capillary 135 (*)    All other components within normal limits  POC OCCULT BLOOD, ED - Abnormal; Notable for the following:    Fecal Occult Bld POSITIVE (*)    All other components within normal limits  URINALYSIS, ROUTINE W REFLEX MICROSCOPIC  TYPE AND SCREEN  PREPARE RBC (CROSSMATCH)    EKG  EKG Interpretation  Date/Time:  Sunday December 23 2016 23:38:05 EST Ventricular Rate:  83 PR Interval:  156 QRS Duration: 88 QT Interval:  410 QTC Calculation: 481 R Axis:   30 Text Interpretation:  Normal sinus rhythm with sinus arrhythmia Prolonged QT  Abnormal ECG When compared with ECG of 01/23/2012, T wave inversion has resolved Confirmed by [facility]  MD, DAVID ([id number]) on 12/23/2016 11:59:24 PM       Radiology No results found.  Procedures Procedures (including critical care time)  CRITICAL CARE Performed by: Merryl Hacker   Total critical care time: 25 minutes  Critical care time was exclusive of separately billable procedures and treating other patients.  Critical care was necessary to treat or prevent imminent or life-threatening deterioration.  Critical care was time spent personally by me on the following activities: development of treatment plan with patient and/or surrogate as well as nursing, discussions with consultants, evaluation of patient's response to treatment, examination of patient, obtaining history from patient or surrogate, ordering and performing treatments and interventions, ordering and review of laboratory studies, ordering and review of radiographic studies, pulse oximetry and re-evaluation of patient's condition.   Medications Ordered in ED Medications  0.9 %  sodium chloride infusion (not administered)  sodium chloride 0.9 % bolus 1,000 mL (not administered)     Initial Impression  / Assessment and Plan / ED Course  I have reviewed the triage vital signs and the nursing notes.  Pertinent labs & imaging results that were available during my care of the patient were reviewed by me and considered in my medical decision making (see chart for details).  Patient presents with generalized weakness. Worsened over the last week. Reports associated dizziness. Vital signs are reassuring. Lab work sent from triage notable for hemoglobin of 4.0. This was rechecked and confirmed. Patient denies any bloody stools or melena. Denies any known bleeding sources. Hemoccult is positive. Given that his vital signs are stable, suspect that this has been a indolent ongoing process. He does have a history of heart disease and does need to be transfused. He is mildly hyponatremic.  It appears dry.  BMP notable for mild AKI and an anion gap of 17. Patient was given 1 L fluid. Type and screen. 2 units of blood ordered. Will discuss with the admitting hospitalist. He needs GI evaluation but this can likely wait in the morning given that he does not have any active bleeding at this time.  Final Clinical Impressions(s) / ED Diagnoses   Final diagnoses:  Symptomatic anemia    New Prescriptions New Prescriptions   No medications on file   I personally performed the services described in this documentation, which was scribed in my presence. The recorded information has been reviewed and is accurate.     Merryl Hacker, MD 12/24/16 [phone]

## 2016-12-24 NOTE — Consult Note (Addendum)
UNASSIGNED PATIENT Reason for Consult: Anemia and blood in stool. Referring Physician: THP  EAMONN SERMENO is an 71 y.o. male.  HPI: Mr. sell Cymbalta is a 71 year old black male with a history of cad s/p stenting, chronic systolic congestive heart failure, EF of 20%, Hypertension, AODM recurrent epistaxis on aspirin presented to the hospital with worsening fatigue and shortness of breath on exertion. He claims he's had these symptoms for about 4 weeks now. He denies having any chest pain. In the emergency room his hemoglobin was found to be 4 g/dL and 2 units of packed red blood cells were ordered he was guaiac positive on testing for FOBT but denied any history of melena or hematochezia. His long-standing history of alcohol abuse. He had a large amount of epistaxis 4 days ago. He used to smoke 1 pack every 3 days till about 20 years ago when he stopped smoking. He denies having any problem with constipation or diarrhea. He is worried about a 25 pound weight loss over the last 2 months. He has never had a colonoscopy.  He sees Dr. Charolette Forward for his cardiac care but does not have a primary care provider. He denies a family history of GI malignancy.   Past Medical History:  Diagnosis Date  . Diabetes mellitus without complication ([place])   . Hypertension    Past Surgical History:  Procedure Laterality Date  . LEFT HEART CATHETERIZATION WITH CORONARY ANGIOGRAM N/A 01/22/2012   Procedure: LEFT HEART CATHETERIZATION WITH CORONARY ANGIOGRAM;  Surgeon: Clent Demark, MD;  Location: [facility] CATH LAB;  Service: Cardiovascular;  Laterality: N/A;  . SUPRAVENTRICULAR TACHYCARDIA ABLATION N/A 01/17/2012   Procedure: SUPRAVENTRICULAR TACHYCARDIA ABLATION;  Surgeon: Evans Lance, MD;  Location: [facility] CATH LAB;  Service: Cardiovascular;  Laterality: N/A;   Family History  Problem Relation Age of Onset  . Hypertension Other    Social History:  reports that he smoked for about 30 years and quit 20 years ago. He  has never used smokeless tobacco. He reports that he drinks alcohol. He reports that he does not use drugs. He is retired. He used to work for [employer].  Allergies: No Known Allergies  Medications: I have reviewed the patient's current medications.  Results for orders placed or performed during the hospital encounter of 12/24/16 (from the past 48 hour(s))  Basic metabolic panel     Status: Abnormal   Collection Time: 12/23/16 11:47 PM  Result Value Ref Range   Sodium 131 (L) 135 - 145 mmol/L   Potassium 4.1 3.5 - 5.1 mmol/L   Chloride 97 (L) 101 - 111 mmol/L   CO2 17 (L) 22 - 32 mmol/L   Glucose, Bld 187 (H) 65 - 99 mg/dL   BUN 23 (H) 6 - 20 mg/dL   Creatinine, Ser 1.70 (H) 0.61 - 1.24 mg/dL   Calcium 8.5 (L) 8.9 - 10.3 mg/dL   GFR calc non Af Amer 39 (L) >60 mL/min   GFR calc Af Amer 46 (L) >60 mL/min    Comment: (NOTE) The eGFR has been calculated using the CKD EPI equation. This calculation has not been validated in all clinical situations. eGFR's persistently <60 mL/min signify possible Chronic Kidney Disease.    Anion gap 17 (H) 5 - 15  CBC     Status: Abnormal   Collection Time: 12/23/16 11:47 PM  Result Value Ref Range   WBC 12.1 (H) 4.0 - 10.5 K/uL   RBC 2.53 (L) 4.22 - 5.81 MIL/uL  Hemoglobin 4.0 (LL) 13.0 - 17.0 g/dL    Comment: REPEATED TO VERIFY SPECIMEN CHECKED FOR CLOTS CRITICAL RESULT CALLED TO, READ BACK BY AND VERIFIED WITH: G.MONTAGUE,RN 0023 12/24/16 M.CAMPBELL    HCT 15.8 (L) 39.0 - 52.0 %   MCV 62.5 (L) 78.0 - 100.0 fL   MCH 15.8 (L) 26.0 - 34.0 pg   MCHC 25.3 (L) 30.0 - 36.0 g/dL   RDW 20.4 (H) 11.5 - 15.5 %   Platelets 404 (H) 150 - 400 K/uL  CBG monitoring, ED     Status: Abnormal   Collection Time: 12/23/16 11:56 PM  Result Value Ref Range   Glucose-Capillary 135 (H) 65 - 99 mg/dL   Comment 1 Notify RN    Comment 2 Document in Chart   Hemoglobin and hematocrit, blood     Status: Abnormal   Collection Time: 12/24/16  1:01 AM   Result Value Ref Range   Hemoglobin 4.0 (LL) 13.0 - 17.0 g/dL    Comment: REPEATED TO VERIFY CRITICAL VALUE NOTED.  VALUE IS CONSISTENT WITH PREVIOUSLY REPORTED AND CALLED VALUE.    HCT 15.8 (L) 39.0 - 52.0 %  Type and screen Imogene     Status: None (Preliminary result)   Collection Time: 12/24/16  1:01 AM  Result Value Ref Range   ISSUE DATE / TIME [id number]    Blood Product Unit Number [id number]    PRODUCT CODE E0336V00    Unit Type and Rh 8400    Blood Product Expiration Date 740814481856    ISSUE DATE / TIME 201801220211    Blood Product Unit Number [id number]    PRODUCT CODE I5027X41    Unit Type and Rh 8400    Blood Product Expiration Date 287867672094    ISSUE DATE / TIME 709628366294    Blood Product Unit Number [id number]    PRODUCT CODE C1275T70    Unit Type and Rh 8400    Blood Product Expiration Date 017494496759    Blood Product Unit Number [id number]    Unit Type and Rh 8400    Blood Product Expiration Date 017793903009   ABO/Rh     Status: None   Collection Time: 12/24/16  1:01 AM  Result Value Ref Range   ABO/RH(D) AB POS   POC occult blood, ED     Status: Abnormal   Collection Time: 12/24/16  1:10 AM  Result Value Ref Range   Fecal Occult Bld POSITIVE (A) NEGATIVE  Prepare RBC     Status: None   Collection Time: 12/24/16  1:31 AM  Result Value Ref Range   Order Confirmation ORDER PROCESSED BY BLOOD BANK   CBG monitoring, ED     Status: Abnormal   Collection Time: 12/24/16  8:21 AM  Result Value Ref Range   Glucose-Capillary 108 (H) 65 - 99 mg/dL  Urinalysis, Routine w reflex microscopic     Status: None   Collection Time: 12/24/16 10:04 AM  Result Value Ref Range   Color, Urine YELLOW YELLOW   APPearance CLEAR CLEAR   Specific Gravity, Urine 1.013 1.005 - 1.030   pH 5.0 5.0 - 8.0   Glucose, UA NEGATIVE NEGATIVE mg/dL   Hgb urine dipstick NEGATIVE NEGATIVE   Bilirubin Urine NEGATIVE NEGATIVE   Ketones, ur  NEGATIVE NEGATIVE mg/dL   Protein, ur NEGATIVE NEGATIVE mg/dL   Nitrite NEGATIVE NEGATIVE   Leukocytes, UA NEGATIVE NEGATIVE  Basic metabolic panel     Status: Abnormal   Collection Time: 12/24/16 10:09  AM  Result Value Ref Range   Sodium 131 (L) 135 - 145 mmol/L   Potassium 3.6 3.5 - 5.1 mmol/L   Chloride 101 101 - 111 mmol/L   CO2 22 22 - 32 mmol/L   Glucose, Bld 99 65 - 99 mg/dL   BUN 25 (H) 6 - 20 mg/dL   Creatinine, Ser 1.46 (H) 0.61 - 1.24 mg/dL   Calcium 8.1 (L) 8.9 - 10.3 mg/dL   GFR calc non Af Amer 47 (L) >60 mL/min   GFR calc Af Amer 55 (L) >60 mL/min    Comment: (NOTE) The eGFR has been calculated using the CKD EPI equation. This calculation has not been validated in all clinical situations. eGFR's persistently <60 mL/min signify possible Chronic Kidney Disease.    Anion gap 8 5 - 15  CBC     Status: Abnormal   Collection Time: 12/24/16 10:09 AM  Result Value Ref Range   WBC 9.6 4.0 - 10.5 K/uL   RBC 2.60 (L) 4.22 - 5.81 MIL/uL   Hemoglobin 5.0 (LL) 13.0 - 17.0 g/dL    Comment: REPEATED TO VERIFY DELTA CHECK NOTED CRITICAL VALUE NOTED.  VALUE IS CONSISTENT WITH PREVIOUSLY REPORTED AND CALLED VALUE. POST TRANSFUSION SPECIMEN    HCT 17.3 (L) 39.0 - 52.0 %   MCV 66.5 (L) 78.0 - 100.0 fL    Comment: REPEATED TO VERIFY   MCH 19.2 (L) 26.0 - 34.0 pg   MCHC 28.9 (L) 30.0 - 36.0 g/dL   RDW 23.3 (H) 11.5 - 15.5 %   Platelets 315 150 - 400 K/uL    Comment: REPEATED TO VERIFY PLATELET COUNT CONFIRMED BY SMEAR   Prepare RBC     Status: None   Collection Time: 12/24/16 11:13 AM  Result Value Ref Range   Order Confirmation ORDER PROCESSED BY BLOOD BANK   CBG monitoring, ED     Status: None   Collection Time: 12/24/16 11:44 AM  Result Value Ref Range   Glucose-Capillary 71 65 - 99 mg/dL  CBC     Status: Abnormal   Collection Time: 12/24/16 12:29 PM  Result Value Ref Range   WBC 9.1 4.0 - 10.5 K/uL   RBC 2.81 (L) 4.22 - 5.81 MIL/uL   Hemoglobin 5.3 (LL) 13.0 -  17.0 g/dL    Comment: REPEATED TO VERIFY CRITICAL RESULT CALLED TO, READ BACK BY AND VERIFIED WITH: CHRISLYN KING,RN AT 1259 ON 01.22.18 BY S. YARBROUGH    HCT 18.7 (L) 39.0 - 52.0 %   MCV 66.5 (L) 78.0 - 100.0 fL   MCH 18.9 (L) 26.0 - 34.0 pg   MCHC 28.3 (L) 30.0 - 36.0 g/dL   RDW 22.8 (H) 11.5 - 15.5 %   Platelets 331 150 - 400 K/uL   ROS Blood pressure 121/65, pulse 64, temperature 97.4 F (36.3 C), temperature source Oral, resp. rate 23, SpO2 99 %. Physical Exam  Constitutional: He is oriented to person, place, and time. He appears well-developed and well-nourished.  HENT:  Head: Normocephalic and atraumatic.  Eyes: Conjunctivae and EOM are normal. Pupils are equal, round, and reactive to light.  Neck: Normal range of motion. Neck supple.  Cardiovascular: Normal rate and regular rhythm.   Respiratory: Effort normal and breath sounds normal.  GI: Soft. Bowel sounds are normal. He exhibits no distension and no mass. There is no tenderness. There is no rebound and no guarding.  Neurological: He is alert and oriented to person, place, and time.  Skin: Skin is  warm and dry.  Psychiatric: He has a normal mood and affect. His behavior is normal. Judgment and thought content normal.   Assessment/Plan: 1) Severe symptomatic anemia with guaiac positive stools and abnormal weight loss-patient has received 3 units of PRBC's and will receive another unit tonight. EGD/Colonoscopy planned for tomorrow.  2) Systolic CHF/HTN/CAD  Stori Royse 12/24/2016, 1:45 PM

## 2016-12-25 ENCOUNTER — Encounter (HOSPITAL_COMMUNITY): Payer: Self-pay | Admitting: Anesthesiology

## 2016-12-25 ENCOUNTER — Encounter (HOSPITAL_COMMUNITY)
Admission: EM | Disposition: A | Discharge status: Home Health | Payer: Self-pay | Source: Home / Self Care | Attending: Internal Medicine

## 2016-12-25 LAB — CBC WITH DIFFERENTIAL/PLATELET
Basophils Absolute: 0 10*3/uL (ref 0.0–0.1)
Basophils Relative: 0 %
EOS ABS: 0.1 10*3/uL (ref 0.0–0.7)
Eosinophils Relative: 1 %
HCT: 19.6 % — ABNORMAL LOW (ref 39.0–52.0)
Hemoglobin: 5.7 g/dL — CL (ref 13.0–17.0)
LYMPHS ABS: 1.7 10*3/uL (ref 0.7–4.0)
Lymphocytes Relative: 16 %
MCH: 20.1 pg — ABNORMAL LOW (ref 26.0–34.0)
MCHC: 29.1 g/dL — AB (ref 30.0–36.0)
MCV: 69 fL — AB (ref 78.0–100.0)
MONO ABS: 0.9 10*3/uL (ref 0.1–1.0)
Monocytes Relative: 8 %
NEUTROS PCT: 75 %
Neutro Abs: 8.1 10*3/uL — ABNORMAL HIGH (ref 1.7–7.7)
PLATELETS: 349 10*3/uL (ref 150–400)
RBC: 2.84 MIL/uL — AB (ref 4.22–5.81)
RDW: 23.3 % — AB (ref 11.5–15.5)
WBC: 10.8 10*3/uL — AB (ref 4.0–10.5)

## 2016-12-25 LAB — BASIC METABOLIC PANEL
ANION GAP: 5 (ref 5–15)
BUN: 13 mg/dL (ref 6–20)
CALCIUM: 8.3 mg/dL — AB (ref 8.9–10.3)
CO2: 24 mmol/L (ref 22–32)
CREATININE: 1.19 mg/dL (ref 0.61–1.24)
Chloride: 107 mmol/L (ref 101–111)
GLUCOSE: 89 mg/dL (ref 65–99)
Potassium: 3.6 mmol/L (ref 3.5–5.1)
Sodium: 136 mmol/L (ref 135–145)

## 2016-12-25 LAB — GLUCOSE, CAPILLARY
GLUCOSE-CAPILLARY: 100 mg/dL — AB (ref 65–99)
GLUCOSE-CAPILLARY: 87 mg/dL (ref 65–99)
GLUCOSE-CAPILLARY: 96 mg/dL (ref 65–99)
Glucose-Capillary: 114 mg/dL — ABNORMAL HIGH (ref 65–99)
Glucose-Capillary: 148 mg/dL — ABNORMAL HIGH (ref 65–99)
Glucose-Capillary: 111 mg/dL — ABNORMAL HIGH (ref 65–99)

## 2016-12-25 LAB — HEMOGLOBIN AND HEMATOCRIT, BLOOD
HCT: 23.2 % — ABNORMAL LOW (ref 39.0–52.0)
Hemoglobin: 6.9 g/dL — CL (ref 13.0–17.0)

## 2016-12-25 LAB — PREPARE RBC (CROSSMATCH)

## 2016-12-25 SURGERY — COLONOSCOPY
Anesthesia: Moderate Sedation

## 2016-12-25 SURGERY — COLONOSCOPY WITH PROPOFOL
Anesthesia: Monitor Anesthesia Care

## 2016-12-25 MED ORDER — DEXTROSE-NACL 5-0.45 % IV SOLN
INTRAVENOUS | Status: DC
  Administered 2016-12-25 – 2016-12-27 (×3): via INTRAVENOUS

## 2016-12-25 MED ORDER — ACETAMINOPHEN 325 MG PO TABS
650.0000 mg | ORAL_TABLET | Freq: Once | ORAL | Status: AC
  Administered 2016-12-25: 650 mg via ORAL
  Filled 2016-12-25: qty 2

## 2016-12-25 MED ORDER — POLYETHYLENE GLYCOL 3350 17 G PO PACK
17.0000 g | PACK | Freq: Every day | ORAL | Status: DC
  Administered 2016-12-25 – 2016-12-28 (×2): 17 g via ORAL
  Filled 2016-12-25 (×3): qty 1

## 2016-12-25 MED ORDER — SODIUM CHLORIDE 0.9 % IV SOLN
Freq: Once | INTRAVENOUS | Status: AC
  Administered 2016-12-25: 250 mL via INTRAVENOUS

## 2016-12-25 MED ORDER — POLYETHYLENE GLYCOL 3350 17 GM/SCOOP PO POWD
1.0000 | Freq: Once | ORAL | Status: DC
  Filled 2016-12-25: qty 255

## 2016-12-25 MED ORDER — FUROSEMIDE 10 MG/ML IJ SOLN
20.0000 mg | Freq: Once | INTRAMUSCULAR | Status: AC
  Administered 2016-12-25: 20 mg via INTRAVENOUS
  Filled 2016-12-25: qty 2

## 2016-12-25 MED ORDER — POLYETHYLENE GLYCOL 3350 17 GM/SCOOP PO POWD
1.0000 | Freq: Once | ORAL | Status: AC
  Administered 2016-12-25: 255 g via ORAL
  Filled 2016-12-25: qty 255

## 2016-12-25 MED ORDER — DIPHENHYDRAMINE HCL 25 MG PO CAPS
25.0000 mg | ORAL_CAPSULE | Freq: Once | ORAL | Status: AC
  Administered 2016-12-25: 25 mg via ORAL
  Filled 2016-12-25: qty 1

## 2016-12-25 NOTE — Progress Notes (Signed)
PROGRESS NOTE    Norman Rodgers  [id number] DOB: 1946-07-30 DOA: 12/24/2016 PCP: Patricia Nettle, MD    Brief Narrative:  71 y/o ? 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   episodoic epistaxis Hemoccult + On asa   Assessment & Plan:   Active Problems:   CAD (coronary artery disease)   Symptomatic anemia   Chronic systolic CHF (congestive heart failure) (HCC)   GI bleeding   Hypertension   Acute GI bleeding   ?Acute GIB-Dr Collene Mares consutled-await recs-ok for clears.  On protonix 65 .  Hemoglobin only 5 -transfuse 3 units PRBC, repeat CBC this a.m. going for endoscopy-unclear if we'll be able to get colonoscopy given inadequate prep. Defer to GI MI with stent-asa 81 held CKD 2-monitor renal function-baseline unchanged.  Labs today Epistaxis-1 episode with no recurrence-patient not complaining of n/v psvt-amio 200 daily.  Cont coreg 12.5 bid--telemetry benign therefore discontinue monitors Htn-ramipril 5 daily for htn--Ok to use as not hypotensive Prior ETOh monitor   SCDs Full code MedSurg Inpatient pending resolution   Consultants:   GI  Procedures:   none  Antimicrobials:   none    Subjective: Patient alert, sleepy No Apparent distress Has not taken much of the prep for colonoscopy No abdominal pain No epistaxis No dark or tarry stool  Objective: Vitals:   12/24/16 1755 12/24/16 1841 12/24/16 2110 12/25/16 0422  BP: 122/67 120/64 132/61 121/62  Pulse: 64 64 67 79  Resp: 14  18 18   Temp:  98.9 F (37.2 C) 98.5 F (36.9 C) 99 F (37.2 C)  TempSrc:  Oral Oral Oral  SpO2: 99% 100% 100% 99%    Intake/Output Summary (Last 24 hours) at 12/25/16 0855 Last data filed at 12/25/16 0847  Gross per 24 hour  Intake             1224 ml  Output             1325 ml  Net             -101 ml   There were no vitals filed for this visit.  Examination:  General exam:  Appears cal, sleepy Respiratory system: Clear to auscultation. Respiratory effort normal. Cardiovascular system: S1 & S2 heard, RRR. No JVD, murmurs Gastrointestinal system: Abdomen is nondistended, soft and nontender. No organomegaly or masses felt. Normal bowel sounds heard. Central nervous system: Alert and oriented. No focal neurological deficits.    Data Reviewed: I have personally reviewed following labs and imaging studies  CBC:  Recent Labs Lab 12/23/16 2347 12/24/16 0101 12/24/16 1009 12/24/16 1229  WBC 12.1*  --  9.6 9.1  HGB 4.0* 4.0* 5.0* 5.3*  HCT 15.8* 15.8* 17.3* 18.7*  MCV 62.5*  --  66.5* 66.5*  PLT 404*  --  315 [id number]   Basic Metabolic Panel:  Recent Labs Lab 12/23/16 2347 12/24/16 1009  NA 131* 131*  K 4.1 3.6  CL 97* 101  CO2 17* 22  GLUCOSE 187* 99  BUN 23* 25*  CREATININE 1.70* 1.46*  CALCIUM 8.5* 8.1*   GFR: CrCl cannot be calculated (Unknown ideal weight.). Liver Function Tests: No results for input(s): AST, ALT, ALKPHOS, BILITOT, PROT, ALBUMIN in the last 168 hours. No results for input(s): LIPASE, AMYLASE in the last 168 hours. No results for input(s): AMMONIA in the last 168 hours. Coagulation Profile: No results for input(s): INR, PROTIME in the  last 168 hours. Cardiac Enzymes: No results for input(s): CKTOTAL, CKMB, CKMBINDEX, TROPONINI in the last 168 hours. BNP (last 3 results) No results for input(s): PROBNP in the last 8760 hours. HbA1C: No results for input(s): HGBA1C in the last 72 hours. CBG:  Recent Labs Lab 12/24/16 2010 12/24/16 2107 12/25/16 0027 12/25/16 0420 12/25/16 0746  GLUCAP 84 107* 100* 96 87   Lipid Profile: No results for input(s): CHOL, HDL, LDLCALC, TRIG, CHOLHDL, LDLDIRECT in the last 72 hours. Thyroid Function Tests: No results for input(s): TSH, T4TOTAL, FREET4, T3FREE, THYROIDAB in the last 72 hours. Anemia Panel: No results for input(s): VITAMINB12, FOLATE, FERRITIN, TIBC, IRON, RETICCTPCT in  the last 72 hours. Sepsis Labs: No results for input(s): PROCALCITON, LATICACIDVEN in the last 168 hours.  No results found for this or any previous visit (from the past 240 hour(s)).       Radiology Studies: No results found.      Scheduled Meds: . sodium chloride  10 mL/hr Intravenous Once  . sodium chloride   Intravenous Once  . amiodarone  200 mg Oral Daily  . carvedilol  12.5 mg Oral BID WC  . insulin aspart  0-9 Units Subcutaneous Q4H  . pantoprazole  40 mg Intravenous Q12H  . ramipril  5 mg Oral Daily  . rosuvastatin  20 mg Oral q1800   Continuous Infusions:   LOS: 1 day    Time spent: [place], [place], MD [facility] Pager [phone]  If 7PM-7AM, please contact night-coverage [email or web address] Password [facility] 12/25/2016, 8:55 AM

## 2016-12-25 NOTE — Care Management Note (Signed)
Case Management Note  Patient Details  Name: Norman Rodgers MRN: [id number] Date of Birth: October 28, 1946  Subjective/Objective:    Admitted with ? GIB.             Kanden Wilmott (Spouse)     [phone]       PCP: Wallene Huh  Action/Plan: Plan:egd/colonoscopy 12/26/2016.....  CM to f/u with disposition needs.  Expected Discharge Date:                  Expected Discharge Plan:  Home/Self Care  In-House Referral:     Discharge planning Services  CM Consult  Post Acute Care Choice:    Choice offered to:     DME Arranged:    DME Agency:     HH Arranged:    HH Agency:     Status of Service:  In process, will continue to follow  If discussed at Long Length of Stay Meetings, dates discussed:    Additional Comments:  Sharin Mons, RN 12/25/2016, 3:31 PM

## 2016-12-25 NOTE — Progress Notes (Signed)
Pt has been uncooperative with drinking his prep, initaly he did not want to drink it but I took time to explain it to him why he needs to drink it, he drunk few cups out of the whole gallon, I went to his room to check if he has been drinking it at around 0200 he claimed that the prep is giving him headache I gave him tylenol went in the room to check up on him again said the headache feels better but he cannot just drink the prep because he cant stand with the taste, I offered to add flavors that came with the prep from the pharmacy but he refuse, pt has only drunk 1/4 of the gallon,  On call practitioner has been paged about patient uncooperative behavior

## 2016-12-25 NOTE — Progress Notes (Signed)
Patient upset about his diet, asked to eat regular food. Dr. Geoffry Paradise was called and because patient refused to drink the prep for coloscopy he will come to talk with the patient and he will decide what will be the next step. Will continue to monitor.

## 2016-12-25 NOTE — Progress Notes (Signed)
Subjective: Hesitant about the colonoscopy and EGD.  Objective: Vital signs in last 24 hours: Temp:  [98.2 F (36.8 C)-99 F (37.2 C)] 98.7 F (37.1 C) (01/23 1446) Pulse Rate:  [54-79] 66 (01/23 1446) Resp:  [14-26] 18 (01/23 0422) BP: (110-132)/(50-68) 110/50 (01/23 1446) SpO2:  [93 %-100 %] 100 % (01/23 1446) Last BM Date: 12/25/16  Intake/Output from previous day: 01/22 0701 - 01/23 0700 In: 1194 [P.O.:320; Blood:874] Out: 900 [Urine:900] Intake/Output this shift: Total I/O In: 340 [P.O.:310; Blood:30] Out: 725 [Urine:725]  General appearance: alert and no distress GI: soft, non-tender; bowel sounds normal; no masses,  no organomegaly  Lab Results:  Recent Labs  12/24/16 1009 12/24/16 1229 12/25/16 0934  WBC 9.6 9.1 10.8*  HGB 5.0* 5.3* 5.7*  HCT 17.3* 18.7* 19.6*  PLT 315 331 349   BMET  Recent Labs  12/23/16 2347 12/24/16 1009 12/25/16 0934  NA 131* 131* 136  K 4.1 3.6 3.6  CL 97* 101 107  CO2 17* 22 24  GLUCOSE 187* 99 89  BUN 23* 25* 13  CREATININE 1.70* 1.46* 1.19  CALCIUM 8.5* 8.1* 8.3*   LFT No results for input(s): PROT, ALBUMIN, AST, ALT, ALKPHOS, BILITOT, BILIDIR, IBILI in the last 72 hours. PT/INR No results for input(s): LABPROT, INR in the last 72 hours. Hepatitis Panel No results for input(s): HEPBSAG, HCVAB, HEPAIGM, HEPBIGM in the last 72 hours. C-Diff No results for input(s): CDIFFTOX in the last 72 hours. Fecal Lactopherrin No results for input(s): FECLLACTOFRN in the last 72 hours.  Studies/Results: No results found.  Medications:  Scheduled: . sodium chloride   Intravenous Once  . amiodarone  200 mg Oral Daily  . carvedilol  12.5 mg Oral BID WC  . furosemide  20 mg Intravenous Once  . insulin aspart  0-9 Units Subcutaneous Q4H  . pantoprazole  40 mg Intravenous Q12H  . ramipril  5 mg Oral Daily  . rosuvastatin  20 mg Oral q1800   Continuous: . dextrose 5 % and 0.45% NaCl 50 mL/hr at 12/25/16 [id number]     Assessment/Plan: 1) Severe anemia. 2) Weight loss. 3) Heme positive stool.   I had a long discussion with the patient and  I explained the EGD/Colonoscopy to the patient.  I was very clear about the intent and goals of the procedure as well as the difficulty with prepping.  I gave ample time and opportunities for the patient to ask questions.  He states that he will give the prep a try.  He knows that he needs to drink the Miralax prep within 2-3 hours.  Plan: 1) EGD/Colonoscopy tomorrow.  LOS: 1 day   Brycelynn Stampley D 12/25/2016, 3:05 PM

## 2016-12-26 ENCOUNTER — Encounter (HOSPITAL_COMMUNITY): Payer: Self-pay | Admitting: *Deleted

## 2016-12-26 ENCOUNTER — Encounter (HOSPITAL_COMMUNITY)
Admission: EM | Disposition: A | Discharge status: Home Health | Payer: Self-pay | Source: Home / Self Care | Attending: Internal Medicine

## 2016-12-26 DIAGNOSIS — K639 Disease of intestine, unspecified: Secondary | ICD-10-CM

## 2016-12-26 DIAGNOSIS — I5022 Chronic systolic (congestive) heart failure: Secondary | ICD-10-CM

## 2016-12-26 DIAGNOSIS — K922 Gastrointestinal hemorrhage, unspecified: Secondary | ICD-10-CM

## 2016-12-26 HISTORY — PX: ESOPHAGOGASTRODUODENOSCOPY: SHX5428

## 2016-12-26 HISTORY — PX: COLONOSCOPY: SHX5424

## 2016-12-26 LAB — CBC WITH DIFFERENTIAL/PLATELET
BASOS ABS: 0 10*3/uL (ref 0.0–0.1)
Basophils Relative: 0 %
Eosinophils Absolute: 0.1 10*3/uL (ref 0.0–0.7)
Eosinophils Relative: 1 %
HCT: 23.1 % — ABNORMAL LOW (ref 39.0–52.0)
Hemoglobin: 6.7 g/dL — CL (ref 13.0–17.0)
LYMPHS ABS: 1.4 10*3/uL (ref 0.7–4.0)
Lymphocytes Relative: 15 %
MCH: 20.9 pg — ABNORMAL LOW (ref 26.0–34.0)
MCHC: 29 g/dL — ABNORMAL LOW (ref 30.0–36.0)
MCV: 72.2 fL — ABNORMAL LOW (ref 78.0–100.0)
MONO ABS: 0.7 10*3/uL (ref 0.1–1.0)
Monocytes Relative: 8 %
NEUTROS PCT: 76 %
Neutro Abs: 7.1 10*3/uL (ref 1.7–7.7)
PLATELETS: 361 10*3/uL (ref 150–400)
RBC: 3.2 MIL/uL — AB (ref 4.22–5.81)
RDW: 24.9 % — AB (ref 11.5–15.5)
WBC: 9.3 10*3/uL (ref 4.0–10.5)

## 2016-12-26 LAB — TYPE AND SCREEN
BLOOD PRODUCT EXPIRATION DATE: 201801302359
BLOOD PRODUCT EXPIRATION DATE: 201801302359
Blood Product Expiration Date: 201801302359
Blood Product Expiration Date: 201802082359
ISSUE DATE / TIME: 201801220211
ISSUE DATE / TIME: 201801220505
ISSUE DATE / TIME: 201801221300
ISSUE DATE / TIME: 201801231435
Unit Type and Rh: 8400
Unit Type and Rh: 8400
Unit Type and Rh: 8400
Unit Type and Rh: 8400

## 2016-12-26 LAB — BASIC METABOLIC PANEL
ANION GAP: 7 (ref 5–15)
BUN: 13 mg/dL (ref 6–20)
CALCIUM: 8.3 mg/dL — AB (ref 8.9–10.3)
CO2: 21 mmol/L — ABNORMAL LOW (ref 22–32)
Chloride: 105 mmol/L (ref 101–111)
Creatinine, Ser: 1.1 mg/dL (ref 0.61–1.24)
Glucose, Bld: 152 mg/dL — ABNORMAL HIGH (ref 65–99)
Potassium: 3.4 mmol/L — ABNORMAL LOW (ref 3.5–5.1)
SODIUM: 133 mmol/L — AB (ref 135–145)

## 2016-12-26 LAB — GLUCOSE, CAPILLARY
GLUCOSE-CAPILLARY: 115 mg/dL — AB (ref 65–99)
GLUCOSE-CAPILLARY: 125 mg/dL — AB (ref 65–99)
GLUCOSE-CAPILLARY: 159 mg/dL — AB (ref 65–99)
Glucose-Capillary: 122 mg/dL — ABNORMAL HIGH (ref 65–99)
Glucose-Capillary: 116 mg/dL — ABNORMAL HIGH (ref 65–99)
Glucose-Capillary: 116 mg/dL — ABNORMAL HIGH (ref 65–99)
Glucose-Capillary: 159 mg/dL — ABNORMAL HIGH (ref 65–99)

## 2016-12-26 LAB — PROTIME-INR
INR: 1.34
Prothrombin Time: 16.7 seconds — ABNORMAL HIGH (ref 11.4–15.2)

## 2016-12-26 SURGERY — COLONOSCOPY
Anesthesia: Moderate Sedation

## 2016-12-26 MED ORDER — LIDOCAINE VISCOUS 2 % MT SOLN
OROMUCOSAL | Status: AC
  Filled 2016-12-26: qty 15

## 2016-12-26 MED ORDER — MIDAZOLAM HCL 5 MG/ML IJ SOLN
INTRAMUSCULAR | Status: AC
  Filled 2016-12-26: qty 2

## 2016-12-26 MED ORDER — MIDAZOLAM HCL 10 MG/2ML IJ SOLN
INTRAMUSCULAR | Status: DC | PRN
  Administered 2016-12-26: 1 mg via INTRAVENOUS
  Administered 2016-12-26 (×3): 2 mg via INTRAVENOUS

## 2016-12-26 MED ORDER — FENTANYL CITRATE (PF) 100 MCG/2ML IJ SOLN
INTRAMUSCULAR | Status: AC
  Filled 2016-12-26: qty 2

## 2016-12-26 MED ORDER — MIDAZOLAM HCL 5 MG/5ML IJ SOLN: INTRAMUSCULAR | Status: DC | PRN

## 2016-12-26 MED ORDER — IOPAMIDOL (ISOVUE-300) INJECTION 61%
INTRAVENOUS | Status: AC
  Administered 2016-12-26: 60 mL
  Filled 2016-12-26: qty 30

## 2016-12-26 MED ORDER — BOOST / RESOURCE BREEZE PO LIQD
1.0000 | Freq: Three times a day (TID) | ORAL | Status: DC
  Administered 2016-12-26 – 2016-12-27 (×2): 1 via ORAL
  Administered 2016-12-28: 237 mL via ORAL

## 2016-12-26 MED ORDER — FENTANYL CITRATE (PF) 100 MCG/2ML IJ SOLN
INTRAMUSCULAR | Status: DC | PRN
  Administered 2016-12-26 (×3): 25 ug via INTRAVENOUS

## 2016-12-26 MED ORDER — SODIUM CHLORIDE 0.9 % IV SOLN
INTRAVENOUS | Status: DC
  Administered 2016-12-26: 500 mL via INTRAVENOUS

## 2016-12-26 MED ORDER — SODIUM CHLORIDE 0.9 % IV SOLN: INTRAVENOUS | Status: DC

## 2016-12-26 MED ORDER — LIDOCAINE VISCOUS 2 % MT SOLN
OROMUCOSAL | Status: DC | PRN
  Administered 2016-12-26: 10 mL via OROMUCOSAL

## 2016-12-26 MED ORDER — DIPHENHYDRAMINE HCL 50 MG/ML IJ SOLN
INTRAMUSCULAR | Status: AC
  Filled 2016-12-26: qty 1

## 2016-12-26 NOTE — Op Note (Addendum)
[facility] Patient Name: Norman Rodgers Procedure Date : 12/26/2016 MRN: [id number] Attending MD: Juanita Craver , MD Date of Birth: 1946-04-07 CSN: [id number] Age: 71 Admit Type: Inpatient Procedure:                Diagnostic EGD. Indications:              Iron deficiency anemia, , Heme positive stool,                            Weight loss, Providers:                Juanita Craver, MD, Cleda Daub, RN, William Dalton, Technician. Referring MD:             Wallene Huh, MD Medicines:                Fentanyl 50 micrograms & Versed 5 mg IV Complications:            No immediate complications. Estimated Blood Loss:     Estimated blood loss: none. Procedure:                Pre-anesthesia assessment:- Prior to the procedure,                            a history and physical was performed, and patient                            medications and allergies were reviewed. The                            patient's tolerance of previous anesthesia was also                            reviewed. The risks and benefits of the procedure                            and the sedation options and risks were discussed                            with the patient. All questions were answered, and                            informed consent was obtained. Prior                            anticoagulants: The patient has taken aspirin, last                            dose was 2 days prior to procedure. ASA Grade                            assessment: III - A patient with severe systemic  disease. After reviewing the risks and benefits,                            the patient was deemed in satisfactory condition to                            undergo the procedure. After obtaining informed                            consent, the endoscope was passed under direct                            vision. Throughout the procedure, the patient's              blood pressure, pulse, and oxygen saturations were                            monitored continuously. The [id number])                            scope was introduced through the mouth, and                            advanced to the second part of duodenum. The EGD                            was accomplished without difficulty. The patient                            tolerated the procedure well. Scope In: Scope Out: Findings:      The examined esophagus and the GEJ appeared widely patent and normal.      The entire examined stomach was normal.      The cardia and gastric fundus were normal on retroflexion.      The examined duodenum was normal. Impression:               - Normal appearing, widely patent esophagus and GEJ.                           - Normal stomach.                           - Normal examined duodenum.                           - No specimens collected. Moderate Sedation:      Moderate (conscious) sedation was administered by the endoscopy nurse       and supervised by the endoscopist. The following parameters were       monitored: oxygen saturation, heart rate, blood pressure, respiratory       rate, EKG, adequacy of pulmonary ventilation, and response to care. Recommendation:           - Clear liquid diet.                           - Continue  present medications.                           - Proceed with a colonoscopy. Procedure Code(s):        --- Professional ---                           [phone], Esophagogastroduodenoscopy, flexible,                            transoral; diagnostic, including collection of                            specimen(s) by brushing or washing, when performed                            (separate procedure) Diagnosis Code(s):        --- Professional ---                           D50.9, Iron deficiency anemia, unspecified                           R19.5, Other fecal abnormalities                           R63.4, Abnormal weight  loss CPT copyright 2016 [employer]. All rights reserved. The codes documented in this report are preliminary and upon coder review may  be revised to meet current compliance requirements. Juanita Craver, MD Juanita Craver, MD 12/26/2016 3:58:11 PM This report has been signed electronically. Number of Addenda: 0

## 2016-12-26 NOTE — Op Note (Addendum)
[facility] Patient Name: Norman Rodgers Procedure Date : 12/26/2016 MRN: [id number] Attending MD: Juanita Craver , MD Date of Birth: January 14, 1946 CSN: [id number] Age: 71 Admit Type: Inpatient Procedure:                Colonoscopy with cold biopsies & cold snare                            polypectomy x 1. Indications:              Severe iron deficiency anemia; Blood in stool;                            Abnormal weight loss; CRC screening for colorectal                            malignant neoplasm Providers:                Juanita Craver, MD, Cleda Daub, RN, William Dalton, Technician Referring MD:             Wallene Huh, MD Medicines:                Fentanyl 25 micrograms & Midazolam 2 mg IV Complications:            No immediate complications. Estimated Blood Loss:     Estimated blood loss: minimal. Procedure:                Pre-anesthesia assessment: - Prior to the                            procedure, a history and physical was performed,                            and patient medications and allergies were                            reviewed. The patient's tolerance of previous                            anesthesia was also reviewed. The risks and                            benefits of the procedure and the sedation options                            and risks were discussed with the patient. All                            questions were answered, and informed consent was                            obtained. Prior anticoagulants: The patient has  taken aspirin, last dose was 2 days prior to                            procedure. ASA Grade assessment: III - A patient                            with severe systemic disease. After reviewing the                            risks and benefits, the patient was deemed in                            satisfactory condition to undergo the procedure. -    Prior to the procedure, a History and Physical was                            performed, and patient medications and allergies                            were reviewed. The patient's tolerance of previous                            anesthesia was also reviewed. The risks and                            benefits of the procedure and the sedation options                            and risks were discussed with the patient. All                            questions were answered, and informed consent was                            obtained. Prior anticoagulants: The patient has                            taken aspirin, last dose was 2 days prior to                            procedure. ASA Grade Assessment: III - A patient                            with severe systemic disease. After reviewing the                            risks and benefits, the patient was deemed in                            satisfactory condition to undergo the procedure.                            After obtaining informed  consent, the colonoscope                            was passed under direct vision. Throughout the                            procedure, the patient's blood pressure, pulse, and                            oxygen saturations were monitored continuously. The                            EC-[id number] scope was introduced through                            the anus and advanced to the the right colon. The                            colonoscopy was performed without difficulty up to                            the right colon mass but the scope could not be                            advanced through the mass. The patient tolerated                            the procedure well. The quality of the bowel                            preparation was adequate. The rectum was                            photographed. The bowel preparation used was                            GoLYTELY. Scope In: 3:35:44 PM Scope  Out: 3:48:15 PM Total Procedure Duration: 0 hours 12 minutes 31 seconds  Findings:      A 6 mm sessile polyp was found in the mid descending colon; the polyp       was removed with a cold snare x 1; .resection and retrieval were       complete.      A few medium-mouthed diverticula were found in the sigmoid colon.      An infiltrative near obstructing, circumferential large mass was found       in the mid-ascending colon; this was biopsied for pathology. The scope       could not be advanced through the mass into the cecum. Impression:               - One 6 mm polyp in the mid descending colon,                            removed with a cold snare x 1; resected and  retrieved.                           - Few scattered diverticula in the sigmoid colon.                           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 Moderate Sedation:      Moderate (conscious) sedation was used. Recommendation:           - Clear liquid diet for now.                           - Refer to a surgeon ASAP.                           - Perform CT scan (computed tomography) of the                            abdomen and pelvis with contrast ASAP.                           - Check CEA levels today.                           - Await pathology results.                           - Continue present medications.                           - Repeat colonoscopy in 1 year for surveillance.                           - Return to GI office in 4 weeks. 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 All rights reserved. The codes documented in this report are preliminary and upon coder review may  be revised to meet current compliance requirements. Juanita Craver, MD Juanita Craver, MD 12/26/2016 4:12:36 PM This report has been signed electronically. Number of Addenda: 0

## 2016-12-26 NOTE — Progress Notes (Addendum)
Pt was scheduled for colonoscopy on 12/26/16. The orders for bowel prep needed clarification as it appeared as being discontinued in the orders. At 9:45pm, GI MD, Dr. Benson Norway was called and he advised to put in a verbal order for Miralax powder. Order was placed. Pharmacy was called to verify and clarify the order. They reported the order had been discontinued by error. Pt was bowel prepped by midnight. Pt is resting. Will continue to monitor.

## 2016-12-26 NOTE — Progress Notes (Signed)
PROGRESS NOTE    Norman Rodgers  [id number] DOB: 1946-09-10 DOA: 12/24/2016 PCP: Patricia Nettle, MD    Brief Narrative:  71 y/o ? 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   episodoic epistaxis Hemoccult + On asa   Assessment & Plan:   Active Problems:   CAD (coronary artery disease)   Symptomatic anemia   Chronic systolic CHF (congestive heart failure) (HCC)   GI bleeding   Hypertension   Acute GI bleeding   ?Acute GIB-/blood loss anemia  Hemoglobin only 5 -transfuse 3 units PRBC,  colonoscopy on 1/24 with colon mass Gi Dr Collene Mares input appreciated  Colon mass, ct ab /pel, ct chest, check cea, call oncology in am  Cad  With MI with stent-asa 81 held  chf last ef 20%, no edema currently  CKD 2-monitor renal function-baseline unchanged.  Labs today  Epistaxis-1 episode with no recurrence-patient not complaining of n/v  psvt-amio 200 daily.  Cont coreg 12.5 bid--telemetry benign therefore discontinue monitors  Htn-ramipril 5 daily for htn--Ok to use as not hypotensive  Prior ETOh monitor    SCDs Full code [place] pending resolution   Consultants:   GI  Call oncology in am  Procedures:   Colonoscopy on 1/24  Antimicrobials:   none    Subjective: Has multiple complaints, want to go home, " I will feel better once I get home" No epistaxis No dark or tarry stool  Objective: Vitals:   12/25/16 1514 12/25/16 1722 12/25/16 2306 12/26/16 0703  BP: (!) 114/50 117/60 123/69 (!) 112/48  Pulse: 67 63 (!) 57 65  Resp:   20 18  Temp: 98.8 F (37.1 C) 98 F (36.7 C) 97.7 F (36.5 C) 98.4 F (36.9 C)  TempSrc: Oral Oral Oral Oral  SpO2: 99% 100% 100% 99%    Intake/Output Summary (Last 24 hours) at 12/26/16 0811 Last data filed at 12/26/16 0720  Gross per 24 hour  Intake             1301 ml  Output             1125 ml  Net              176 ml    There were no vitals filed for this visit.  Examination:  General exam: Appears cal, sleepy Respiratory system: Clear to auscultation. Respiratory effort normal. Cardiovascular system: S1 & S2 heard, RRR. No JVD, murmurs Gastrointestinal system: Abdomen is nondistended, soft and nontender. No organomegaly or masses felt. Normal bowel sounds heard. Central nervous system: Alert and oriented. No focal neurological deficits.    Data Reviewed: I have personally reviewed following labs and imaging studies  CBC:  Recent Labs Lab 12/23/16 2347  12/24/16 1009 12/24/16 1229 12/25/16 0934 12/25/16 1930 12/26/16 0625  WBC 12.1*  --  9.6 9.1 10.8*  --  9.3  NEUTROABS  --   --   --   --  8.1*  --  PENDING  HGB 4.0*  < > 5.0* 5.3* 5.7* 6.9* 6.7*  HCT 15.8*  < > 17.3* 18.7* 19.6* 23.2* 23.1*  MCV 62.5*  --  66.5* 66.5* 69.0*  --  72.2*  PLT 404*  --  315 331 349  --  361  < > = values in this interval not displayed. Basic Metabolic Panel:  Recent Labs Lab 12/23/16 2347 12/24/16 1009 12/25/16 0934 12/26/16 0625  NA  131* 131* 136 133*  K 4.1 3.6 3.6 3.4*  CL 97* 101 107 105  CO2 17* 22 24 21*  GLUCOSE 187* 99 89 152*  BUN 23* 25* 13 13  CREATININE 1.70* 1.46* 1.19 1.10  CALCIUM 8.5* 8.1* 8.3* 8.3*   GFR: CrCl cannot be calculated (Unknown ideal weight.). Liver Function Tests: No results for input(s): AST, ALT, ALKPHOS, BILITOT, PROT, ALBUMIN in the last 168 hours. No results for input(s): LIPASE, AMYLASE in the last 168 hours. No results for input(s): AMMONIA in the last 168 hours. Coagulation Profile: No results for input(s): INR, PROTIME in the last 168 hours. Cardiac Enzymes: No results for input(s): CKTOTAL, CKMB, CKMBINDEX, TROPONINI in the last 168 hours. BNP (last 3 results) No results for input(s): PROBNP in the last 8760 hours. HbA1C: No results for input(s): HGBA1C in the last 72 hours. CBG:  Recent Labs Lab 12/25/16 1636 12/25/16 2127 12/26/16 0047  12/26/16 0538 12/26/16 0744  GLUCAP 148* 111* 115* 159* 122*   Lipid Profile: No results for input(s): CHOL, HDL, LDLCALC, TRIG, CHOLHDL, LDLDIRECT in the last 72 hours. Thyroid Function Tests: No results for input(s): TSH, T4TOTAL, FREET4, T3FREE, THYROIDAB in the last 72 hours. Anemia Panel: No results for input(s): VITAMINB12, FOLATE, FERRITIN, TIBC, IRON, RETICCTPCT in the last 72 hours. Sepsis Labs: No results for input(s): PROCALCITON, LATICACIDVEN in the last 168 hours.  No results found for this or any previous visit (from the past 240 hour(s)).       Radiology Studies: No results found.      Scheduled Meds: . sodium chloride   Intravenous Once  . amiodarone  200 mg Oral Daily  . carvedilol  12.5 mg Oral BID WC  . insulin aspart  0-9 Units Subcutaneous Q4H  . pantoprazole  40 mg Intravenous Q12H  . polyethylene glycol  17 g Oral Daily  . ramipril  5 mg Oral Daily  . rosuvastatin  20 mg Oral q1800   Continuous Infusions: . dextrose 5 % and 0.45% NaCl 50 mL/hr at 12/26/16 0546     LOS: 2 days    Time spent: 25    Caley Volkert, MD PhD [facility] Pager [phone]  If 7PM-7AM, please contact night-coverage [email or web address] Password [email or web address] 12/26/2016, 8:11 AM

## 2016-12-26 NOTE — Consult Note (Signed)
           [facility] CM Primary Care Navigator  12/26/2016  Norman Rodgers Jun 02, 1946 [id number]   Attempt to see patient at thebedside to identify possible discharge needsbut nursing staff states that patient is having a procedure (colonoscopy) at this time.  Will try another time to meet with patient when available.   For questions, please contact:  Dannielle Huh, BSN, RN- [facility] Primary Care Navigator  Telephone: [phone] [place]

## 2016-12-27 ENCOUNTER — Inpatient Hospital Stay (HOSPITAL_COMMUNITY): Payer: Medicare Other

## 2016-12-27 ENCOUNTER — Encounter (HOSPITAL_COMMUNITY): Payer: Self-pay | Admitting: Gastroenterology

## 2016-12-27 DIAGNOSIS — J449 Chronic obstructive pulmonary disease, unspecified: Secondary | ICD-10-CM

## 2016-12-27 DIAGNOSIS — D649 Anemia, unspecified: Secondary | ICD-10-CM

## 2016-12-27 DIAGNOSIS — C787 Secondary malignant neoplasm of liver and intrahepatic bile duct: Secondary | ICD-10-CM

## 2016-12-27 DIAGNOSIS — Z7189 Other specified counseling: Secondary | ICD-10-CM

## 2016-12-27 DIAGNOSIS — C189 Malignant neoplasm of colon, unspecified: Secondary | ICD-10-CM

## 2016-12-27 DIAGNOSIS — I714 Abdominal aortic aneurysm, without rupture: Secondary | ICD-10-CM

## 2016-12-27 DIAGNOSIS — Z515 Encounter for palliative care: Secondary | ICD-10-CM

## 2016-12-27 LAB — CBC
HCT: 25.9 % — ABNORMAL LOW (ref 39.0–52.0)
HEMOGLOBIN: 7.4 g/dL — AB (ref 13.0–17.0)
MCH: 21.3 pg — AB (ref 26.0–34.0)
MCHC: 28.6 g/dL — AB (ref 30.0–36.0)
MCV: 74.4 fL — ABNORMAL LOW (ref 78.0–100.0)
Platelets: 442 10*3/uL — ABNORMAL HIGH (ref 150–400)
RBC: 3.48 MIL/uL — ABNORMAL LOW (ref 4.22–5.81)
RDW: 26.8 % — ABNORMAL HIGH (ref 11.5–15.5)
WBC: 7.3 10*3/uL (ref 4.0–10.5)

## 2016-12-27 LAB — BASIC METABOLIC PANEL
Anion gap: 10 (ref 5–15)
BUN: 8 mg/dL (ref 6–20)
CHLORIDE: 106 mmol/L (ref 101–111)
CO2: 22 mmol/L (ref 22–32)
CREATININE: 0.98 mg/dL (ref 0.61–1.24)
Calcium: 8.8 mg/dL — ABNORMAL LOW (ref 8.9–10.3)
GFR calc Af Amer: >60 mL/min (ref >60)
GFR calc non Af Amer: >60 mL/min (ref >60)
Glucose, Bld: 110 mg/dL — ABNORMAL HIGH (ref 65–99)
Potassium: 3.7 mmol/L (ref 3.5–5.1)
SODIUM: 138 mmol/L (ref 135–145)

## 2016-12-27 LAB — GLUCOSE, CAPILLARY
GLUCOSE-CAPILLARY: 136 mg/dL — AB (ref 65–99)
GLUCOSE-CAPILLARY: 169 mg/dL — AB (ref 65–99)
GLUCOSE-CAPILLARY: 195 mg/dL — AB (ref 65–99)
Glucose-Capillary: 129 mg/dL — ABNORMAL HIGH (ref 65–99)
Glucose-Capillary: 167 mg/dL — ABNORMAL HIGH (ref 65–99)
Glucose-Capillary: 183 mg/dL — ABNORMAL HIGH (ref 65–99)

## 2016-12-27 LAB — PREPARE RBC (CROSSMATCH)

## 2016-12-27 LAB — MAGNESIUM: MAGNESIUM: 2.2 mg/dL (ref 1.7–2.4)

## 2016-12-27 MED ORDER — IOPAMIDOL (ISOVUE-300) INJECTION 61%
INTRAVENOUS | Status: AC
  Administered 2016-12-27: 100 mL via INTRAVENOUS
  Filled 2016-12-27: qty 100

## 2016-12-27 MED ORDER — SODIUM CHLORIDE 0.9 % IV SOLN
Freq: Once | INTRAVENOUS | Status: AC
  Administered 2016-12-27: 21:00:00 via INTRAVENOUS

## 2016-12-27 MED ORDER — ACETAMINOPHEN 325 MG PO TABS
650.0000 mg | ORAL_TABLET | Freq: Once | ORAL | Status: AC
  Administered 2016-12-27: 650 mg via ORAL
  Filled 2016-12-27: qty 2

## 2016-12-27 MED ORDER — DIPHENHYDRAMINE HCL 25 MG PO CAPS
25.0000 mg | ORAL_CAPSULE | Freq: Once | ORAL | Status: AC
  Administered 2016-12-27: 25 mg via ORAL
  Filled 2016-12-27 (×2): qty 1

## 2016-12-27 NOTE — Progress Notes (Signed)
PROGRESS NOTE    Norman Rodgers  [id number] DOB: January 23, 1946 DOA: 12/24/2016 PCP: Patricia Nettle, MD    Brief Narrative:  71 y/o ? 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   episodoic epistaxis Hemoccult + On asa   Assessment & Plan:   Active Problems:   CAD (coronary artery disease)   Symptomatic anemia   Chronic systolic CHF (congestive heart failure) (HCC)   GI bleeding   Hypertension   Acute GI bleeding   Cancer (HCC)   Goals of care, counseling/discussion   Palliative care by specialist   ?Acute GIB-/blood loss anemia  Hemoglobin only 5 -transfuse 3 units PRBC,  colonoscopy on 1/24 with colon mass Gi input appreciated  Colon mass with live mets  oncology consulted  AAA 6.8 cm, vascular surgery consulted  Cad  With MI with stent-asa 81 held  chf last ef 20% in 2014, ef 50%  in 2017, no edema currently  CKD 2-monitor renal function-baseline unchanged.  Labs today  Epistaxis-1 episode with no recurrence-patient not complaining of n/v  psvt-amio 200 daily.  Cont coreg 12.5 bid--telemetry benign therefore discontinue monitors  Htn-ramipril 5 daily for htn--Ok to use as not hypotensive  Prior ETOh monitor    SCDs Full code [place] pending resolution   Consultants:   GI  oncology   Vascular surgery  General surgery  Palliative care  Procedures:   Colonoscopy on 1/24  Antimicrobials:   none    Subjective:  he denies pain, he is  Trying to grasp the concepts that he has cancer No epistaxis No dark or tarry stool  Objective: Vitals:   12/26/16 1633 12/26/16 2146 12/27/16 0605 12/27/16 1526  BP: 119/63 (!) 106/51 (!) 109/59 115/63  Pulse: 98 64 67 71  Resp: (!) 25 (!) 22 18 18   Temp:  99.2 F (37.3 C) 99.8 F (37.7 C) 98.1 F (36.7 C)  TempSrc:   Oral   SpO2: 98% 100% 99% 100%    Intake/Output Summary (Last 24 hours) at  12/27/16 2014 Last data filed at 12/27/16 1300  Gross per 24 hour  Intake             1000 ml  Output             1350 ml  Net             -350 ml   There were no vitals filed for this visit.  Examination:  General exam: Appears cal, sleepy Respiratory system: Clear to auscultation. Respiratory effort normal. Cardiovascular system: S1 & S2 heard, RRR. No JVD, murmurs Gastrointestinal system: Abdomen is nondistended, soft and nontender. No organomegaly or masses felt. Normal bowel sounds heard. Central nervous system: Alert and oriented. No focal neurological deficits.    Data Reviewed: I have personally reviewed following labs and imaging studies  CBC:  Recent Labs Lab 12/24/16 1009 12/24/16 1229 12/25/16 0934 12/25/16 1930 12/26/16 0625 12/27/16 0648  WBC 9.6 9.1 10.8*  --  9.3 7.3  NEUTROABS  --   --  8.1*  --  7.1  --   HGB 5.0* 5.3* 5.7* 6.9* 6.7* 7.4*  HCT 17.3* 18.7* 19.6* 23.2* 23.1* 25.9*  MCV 66.5* 66.5* 69.0*  --  72.2* 74.4*  PLT 315 331 349  --  361 [id number]*   Basic Metabolic Panel:  Recent Labs Lab 12/23/16 2347 12/24/16 1009 12/25/16 0934 12/26/16 [id number] 12/27/16 [id number]  NA 131* 131* 136 133* 138  K 4.1 3.6 3.6 3.4* 3.7  CL 97* 101 107 105 106  CO2 17* 22 24 21* 22  GLUCOSE 187* 99 89 152* 110*  BUN 23* 25* 13 13 8   CREATININE 1.70* 1.46* 1.19 1.10 0.98  CALCIUM 8.5* 8.1* 8.3* 8.3* 8.8*  MG  --   --   --   --  2.2   GFR: CrCl cannot be calculated (Unknown ideal weight.). Liver Function Tests: No results for input(s): AST, ALT, ALKPHOS, BILITOT, PROT, ALBUMIN in the last 168 hours. No results for input(s): LIPASE, AMYLASE in the last 168 hours. No results for input(s): AMMONIA in the last 168 hours. Coagulation Profile:  Recent Labs Lab 12/26/16 0849  INR 1.34   Cardiac Enzymes: No results for input(s): CKTOTAL, CKMB, CKMBINDEX, TROPONINI in the last 168 hours. BNP (last 3 results) No results for input(s): PROBNP in the last 8760  hours. HbA1C: No results for input(s): HGBA1C in the last 72 hours. CBG:  Recent Labs Lab 12/27/16 0404 12/27/16 0755 12/27/16 1136 12/27/16 1739 12/27/16 1954  GLUCAP 136* 129* 167* 183* 169*   Lipid Profile: No results for input(s): CHOL, HDL, LDLCALC, TRIG, CHOLHDL, LDLDIRECT in the last 72 hours. Thyroid Function Tests: No results for input(s): TSH, T4TOTAL, FREET4, T3FREE, THYROIDAB in the last 72 hours. Anemia Panel: No results for input(s): VITAMINB12, FOLATE, FERRITIN, TIBC, IRON, RETICCTPCT in the last 72 hours. Sepsis Labs: No results for input(s): PROCALCITON, LATICACIDVEN in the last 168 hours.  No results found for this or any previous visit (from the past 240 hour(s)).       Radiology Studies: Ct Chest W Contrast  Result Date: 12/27/2016 CLINICAL DATA:  Recently diagnosed with colon cancer on endoscopy. Assess for metastatic disease. Initial encounter. EXAM: CT CHEST, ABDOMEN, AND PELVIS WITH CONTRAST TECHNIQUE: Multidetector CT imaging of the chest, abdomen and pelvis was performed following the standard protocol during bolus administration of intravenous contrast. CONTRAST:  127mL ISOVUE-300 IOPAMIDOL (ISOVUE-300) INJECTION 61% COMPARISON:  CTA of the chest performed 01/17/2012, and report from CT of the abdomen and pelvis performed 12/23/2001 FINDINGS: CT CHEST FINDINGS Cardiovascular: Scattered coronary artery calcification is noted. The heart remains normal in size. Minimal mural thrombus is noted along the aortic arch, without evidence of luminal narrowing. The great vessels are grossly unremarkable in appearance. Mediastinum/Nodes: No mediastinal lymphadenopathy is seen. No pericardial effusion is identified. The thyroid gland is grossly unremarkable. No axial lymphadenopathy is appreciated. Lungs/Pleura: Mild right-sided peripheral scarring and trace right-sided pleural fluid is noted. The lungs are otherwise grossly clear. No pneumothorax is seen. No dominant  mass is identified within the lungs. Musculoskeletal: No acute osseous abnormalities are identified. The visualized musculature is unremarkable in appearance. Mild skin thickening along the left lateral chest wall is of uncertain significance, without a focal mass. CT ABDOMEN PELVIS FINDINGS Hepatobiliary: Multiple large mildly heterogeneous masses are noted throughout the liver, measuring up to 7.5 cm in size. These are compatible with metastatic disease. There is associated enlargement of the caudate lobe. The gallbladder contains small stones and likely sludge. It is otherwise unremarkable in appearance. The common bile duct remains normal in caliber. Pancreas: The pancreas is within normal limits. Spleen: The spleen is unremarkable in appearance. Adrenals/Urinary Tract: The adrenal glands are unremarkable in appearance. Small left renal cysts are seen. A nonobstructing 3 mm stone is noted at the interpole region of the right kidney. Nonspecific perinephric stranding is noted bilaterally. There is no evidence  of hydronephrosis. Mild left-sided pelvicaliectasis remains within normal limits. No obstructing ureteral stones are identified. Stomach/Bowel: The appendix remains normal in caliber, without evidence of appendicitis. There is circumferential wall thickening along the mid transverse colon, measuring approximately 7 cm in length, compatible with primary colonic malignancy. This corresponds to the finding on colonoscopy. Associated prominent vasculature is noted. There is also less well defined diffuse wall thickening about the cecum and ileocecal junction. This may reflect underlying inflammation, though given its appearance, a second primary malignancy cannot be excluded. The remainder of the colon is grossly unremarkable. The small bowel is unremarkable in appearance. A metallic density is noted at the right mid abdomen, likely reflecting remote injury. The stomach is grossly unremarkable in appearance.  Vascular/Lymphatic: There is aneurysmal dilatation of the infrarenal abdominal aorta to 6.8 cm in transverse dimension and 6.5 cm in AP dimension, with associated mural thrombus. There appears to be chronic sequelae of hemorrhage into the wall of the aorta, with scattered associated irregular calcification in the visualized mural thrombus. This finding is stable on delayed images, without evidence of acute hemorrhage at this time. Aneurysmal dilatation resolves at the level of the aortic bifurcation. There is focal aneurysmal dilatation at the distal right common iliac artery, measuring up to 2.3 cm in diameter. Scattered calcification is noted along the abdominal aorta and its branches. Reproductive: The bladder is moderately distended and grossly unremarkable. The prostate remains normal in size, with minimal calcification. Other: No additional soft tissue abnormalities are seen. Musculoskeletal: No acute osseous abnormalities are identified. The visualized musculature is unremarkable in appearance. IMPRESSION: 1. 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 There appears to be chronic sequelae of hemorrhage into the wall of the aorta, with scattered associated irregular calcification in the visualized mural thrombus. This finding is stable on delayed images, without evidence of acute hemorrhage at this time. Vascular surgery consultation recommended due to increased risk of rupture for AAA >5.5 cm. This recommendation follows ACR consensus guidelines: White Paper of the ACR Incidental Findings Committee II on Vascular Findings. J Am Coll Radiol 2013; 10:789-794. 2. 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 Scattered aortic atherosclerosis noted. 3. Circumferential wall thickening along the mid transverse colon, measuring 7 cm in length, compatible  with primary colonic malignancy. This corresponds to the finding on colonoscopy. Associated prominent vasculature noted. 4. Less well defined diffuse wall thickening about the cecum and ileocecal junction. This may reflect underlying inflammation, though given its appearance, a second colonic primary malignancy cannot be excluded. 5. Diffuse metastatic disease throughout the liver, with heterogeneous masses measuring up to 7.5 cm in size, and associated enlargement of the caudate lobe. 6. Mild focal skin thickening along the left lateral chest wall is of uncertain significance, without definite evidence of focal mass. Would correlate for any associated skin findings. 7. Scattered coronary artery calcification noted. 8. Mild right-sided peripheral scarring and trace right-sided pleural fluid noted. Lungs otherwise clear. 9. Small left renal cysts. Nonobstructing 3 mm stone at the interpole region of the right kidney. 10. Small stones and likely sludge within the gallbladder. Gallbladder otherwise unremarkable. These results were called by telephone at the time of interpretation on 12/27/2016 at 2:03 am to Chaney Malling NP, who verbally acknowledged these results. Electronically Signed   By: Garald Balding M.D.   On: 12/27/2016 02:15   Ct  Abdomen Pelvis W Contrast  Result Date: 12/27/2016 CLINICAL DATA:  Recently diagnosed with colon cancer on endoscopy. Assess for metastatic disease. Initial encounter. EXAM: CT CHEST, ABDOMEN, AND PELVIS WITH CONTRAST TECHNIQUE: Multidetector CT imaging of the chest, abdomen and pelvis was performed following the standard protocol during bolus administration of intravenous contrast. CONTRAST:  175mL ISOVUE-300 IOPAMIDOL (ISOVUE-300) INJECTION 61% COMPARISON:  CTA of the chest performed 01/17/2012, and report from CT of the abdomen and pelvis performed 12/23/2001 FINDINGS: CT CHEST FINDINGS Cardiovascular: Scattered coronary artery calcification is noted. The heart remains  normal in size. Minimal mural thrombus is noted along the aortic arch, without evidence of luminal narrowing. The great vessels are grossly unremarkable in appearance. Mediastinum/Nodes: No mediastinal lymphadenopathy is seen. No pericardial effusion is identified. The thyroid gland is grossly unremarkable. No axial lymphadenopathy is appreciated. Lungs/Pleura: Mild right-sided peripheral scarring and trace right-sided pleural fluid is noted. The lungs are otherwise grossly clear. No pneumothorax is seen. No dominant mass is identified within the lungs. Musculoskeletal: No acute osseous abnormalities are identified. The visualized musculature is unremarkable in appearance. Mild skin thickening along the left lateral chest wall is of uncertain significance, without a focal mass. CT ABDOMEN PELVIS FINDINGS Hepatobiliary: Multiple large mildly heterogeneous masses are noted throughout the liver, measuring up to 7.5 cm in size. These are compatible with metastatic disease. There is associated enlargement of the caudate lobe. The gallbladder contains small stones and likely sludge. It is otherwise unremarkable in appearance. The common bile duct remains normal in caliber. Pancreas: The pancreas is within normal limits. Spleen: The spleen is unremarkable in appearance. Adrenals/Urinary Tract: The adrenal glands are unremarkable in appearance. Small left renal cysts are seen. A nonobstructing 3 mm stone is noted at the interpole region of the right kidney. Nonspecific perinephric stranding is noted bilaterally. There is no evidence of hydronephrosis. Mild left-sided pelvicaliectasis remains within normal limits. No obstructing ureteral stones are identified. Stomach/Bowel: The appendix remains normal in caliber, without evidence of appendicitis. There is circumferential wall thickening along the mid transverse colon, measuring approximately 7 cm in length, compatible with primary colonic malignancy. This corresponds to the  finding on colonoscopy. Associated prominent vasculature is noted. There is also less well defined diffuse wall thickening about the cecum and ileocecal junction. This may reflect underlying inflammation, though given its appearance, a second primary malignancy cannot be excluded. The remainder of the colon is grossly unremarkable. The small bowel is unremarkable in appearance. A metallic density is noted at the right mid abdomen, likely reflecting remote injury. The stomach is grossly unremarkable in appearance. Vascular/Lymphatic: There is aneurysmal dilatation of the infrarenal abdominal aorta to 6.8 cm in transverse dimension and 6.5 cm in AP dimension, with associated mural thrombus. There appears to be chronic sequelae of hemorrhage into the wall of the aorta, with scattered associated irregular calcification in the visualized mural thrombus. This finding is stable on delayed images, without evidence of acute hemorrhage at this time. Aneurysmal dilatation resolves at the level of the aortic bifurcation. There is focal aneurysmal dilatation at the distal right common iliac artery, measuring up to 2.3 cm in diameter. Scattered calcification is noted along the abdominal aorta and its branches. Reproductive: The bladder is moderately distended and grossly unremarkable. The prostate remains normal in size, with minimal calcification. Other: No additional soft tissue abnormalities are seen. Musculoskeletal: No acute osseous abnormalities are identified. The visualized musculature is unremarkable in appearance. IMPRESSION: 1. Aneurysmal dilatation of the infrarenal abdominal aorta to 6.8 cm  in transverse dimension and 6.5 cm in AP dimension, with associated mural thrombus. There appears to be chronic sequelae of hemorrhage into the wall of the aorta, with scattered associated irregular calcification in the visualized mural thrombus. This finding is stable on delayed images, without evidence of acute hemorrhage at  this time. Vascular surgery consultation recommended due to increased risk of rupture for AAA >5.5 cm. This recommendation follows ACR consensus guidelines: White Paper of the ACR Incidental Findings Committee II on Vascular Findings. J Am Coll Radiol 2013; 10:789-794. 2. 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 Scattered aortic atherosclerosis noted. 3. Circumferential wall thickening along the mid transverse colon, measuring 7 cm in length, compatible with primary colonic malignancy. This corresponds to the finding on colonoscopy. Associated prominent vasculature noted. 4. Less well defined diffuse wall thickening about the cecum and ileocecal junction. This may reflect underlying inflammation, though given its appearance, a second colonic primary malignancy cannot be excluded. 5. Diffuse metastatic disease throughout the liver, with heterogeneous masses measuring up to 7.5 cm in size, and associated enlargement of the caudate lobe. 6. Mild focal skin thickening along the left lateral chest wall is of uncertain significance, without definite evidence of focal mass. Would correlate for any associated skin findings. 7. Scattered coronary artery calcification noted. 8. Mild right-sided peripheral scarring and trace right-sided pleural fluid noted. Lungs otherwise clear. 9. Small left renal cysts. Nonobstructing 3 mm stone at the interpole region of the right kidney. 10. Small stones and likely sludge within the gallbladder. Gallbladder otherwise unremarkable. These results were called by telephone at the time of interpretation on 12/27/2016 at 2:03 am to Chaney Malling NP, who verbally acknowledged these results. Electronically Signed   By: Garald Balding M.D.   On: 12/27/2016 02:15        Scheduled Meds: . sodium chloride   Intravenous Once  . sodium chloride   Intravenous Once  . acetaminophen  650 mg Oral Once  . amiodarone  200 mg Oral  Daily  . carvedilol  12.5 mg Oral BID WC  . diphenhydrAMINE  25 mg Oral Once  . feeding supplement  1 Container Oral TID BM  . insulin aspart  0-9 Units Subcutaneous Q4H  . pantoprazole  40 mg Intravenous Q12H  . polyethylene glycol  17 g Oral Daily  . ramipril  5 mg Oral Daily  . rosuvastatin  20 mg Oral q1800   Continuous Infusions:    LOS: 3 days    Time spent: 45    Tenille Morrill, MD PhD [facility] Pager [phone]  If 7PM-7AM, please contact night-coverage [email or web address] Password [email or web address] 12/27/2016, 8:14 PM

## 2016-12-27 NOTE — Progress Notes (Signed)
Patient asking if diet can be advanced from clear liquids. MD paged

## 2016-12-27 NOTE — Progress Notes (Signed)
Subjective: No complaints.  Awaiting Oncology consultation.  Objective: Vital signs in last 24 hours: Temp:  [97.6 F (36.4 C)-99.8 F (37.7 C)] 98.1 F (36.7 C) (01/25 1526) Pulse Rate:  [64-98] 71 (01/25 1526) Resp:  [18-68] 18 (01/25 1526) BP: (104-123)/(43-65) 115/63 (01/25 1526) SpO2:  [97 %-100 %] 100 % (01/25 1526) Last BM Date: 12/26/16  Intake/Output from previous day: 01/24 0701 - 01/25 0700 In: 1180 [P.O.:880; I.V.:300] Out: 1750 [Urine:1750] Intake/Output this shift: No intake/output data recorded.  General appearance: alert and no distress GI: soft, non-tender; bowel sounds normal; no masses,  no organomegaly  Lab Results:  Recent Labs  12/25/16 0934 12/25/16 1930 12/26/16 0625 12/27/16 0648  WBC 10.8*  --  9.3 7.3  HGB 5.7* 6.9* 6.7* 7.4*  HCT 19.6* 23.2* 23.1* 25.9*  PLT 349  --  361 442*   BMET  Recent Labs  12/25/16 0934 12/26/16 0625 12/27/16 0648  NA 136 133* 138  K 3.6 3.4* 3.7  CL 107 105 106  CO2 24 21* 22  GLUCOSE 89 152* 110*  BUN 13 13 8   CREATININE 1.19 1.10 0.98  CALCIUM 8.3* 8.3* 8.8*   LFT No results for input(s): PROT, ALBUMIN, AST, ALT, ALKPHOS, BILITOT, BILIDIR, IBILI in the last 72 hours. PT/INR  Recent Labs  12/26/16 0849  LABPROT 16.7*  INR 1.34   Hepatitis Panel No results for input(s): HEPBSAG, HCVAB, HEPAIGM, HEPBIGM in the last 72 hours. C-Diff No results for input(s): CDIFFTOX in the last 72 hours. Fecal Lactopherrin No results for input(s): FECLLACTOFRN in the last 72 hours.  Studies/Results: Ct Chest W Contrast  Result Date: 12/27/2016 CLINICAL DATA:  Recently diagnosed with colon cancer on endoscopy. Assess for metastatic disease. Initial encounter. EXAM: CT CHEST, ABDOMEN, AND PELVIS WITH CONTRAST TECHNIQUE: Multidetector CT imaging of the chest, abdomen and pelvis was performed following the standard protocol during bolus administration of intravenous contrast. CONTRAST:  15mL ISOVUE-300 IOPAMIDOL  (ISOVUE-300) INJECTION 61% COMPARISON:  CTA of the chest performed 01/17/2012, and report from CT of the abdomen and pelvis performed 12/23/2001 FINDINGS: CT CHEST FINDINGS Cardiovascular: Scattered coronary artery calcification is noted. The heart remains normal in size. Minimal mural thrombus is noted along the aortic arch, without evidence of luminal narrowing. The great vessels are grossly unremarkable in appearance. Mediastinum/Nodes: No mediastinal lymphadenopathy is seen. No pericardial effusion is identified. The thyroid gland is grossly unremarkable. No axial lymphadenopathy is appreciated. Lungs/Pleura: Mild right-sided peripheral scarring and trace right-sided pleural fluid is noted. The lungs are otherwise grossly clear. No pneumothorax is seen. No dominant mass is identified within the lungs. Musculoskeletal: No acute osseous abnormalities are identified. The visualized musculature is unremarkable in appearance. Mild skin thickening along the left lateral chest wall is of uncertain significance, without a focal mass. CT ABDOMEN PELVIS FINDINGS Hepatobiliary: Multiple large mildly heterogeneous masses are noted throughout the liver, measuring up to 7.5 cm in size. These are compatible with metastatic disease. There is associated enlargement of the caudate lobe. The gallbladder contains small stones and likely sludge. It is otherwise unremarkable in appearance. The common bile duct remains normal in caliber. Pancreas: The pancreas is within normal limits. Spleen: The spleen is unremarkable in appearance. Adrenals/Urinary Tract: The adrenal glands are unremarkable in appearance. Small left renal cysts are seen. A nonobstructing 3 mm stone is noted at the interpole region of the right kidney. Nonspecific perinephric stranding is noted bilaterally. There is no evidence of hydronephrosis. Mild left-sided pelvicaliectasis remains within normal limits. No  obstructing ureteral stones are identified.  Stomach/Bowel: The appendix remains normal in caliber, without evidence of appendicitis. There is circumferential wall thickening along the mid transverse colon, measuring approximately 7 cm in length, compatible with primary colonic malignancy. This corresponds to the finding on colonoscopy. Associated prominent vasculature is noted. There is also less well defined diffuse wall thickening about the cecum and ileocecal junction. This may reflect underlying inflammation, though given its appearance, a second primary malignancy cannot be excluded. The remainder of the colon is grossly unremarkable. The small bowel is unremarkable in appearance. A metallic density is noted at the right mid abdomen, likely reflecting remote injury. The stomach is grossly unremarkable in appearance. Vascular/Lymphatic: There is aneurysmal dilatation of the infrarenal abdominal aorta to 6.8 cm in transverse dimension and 6.5 cm in AP dimension, with associated mural thrombus. There appears to be chronic sequelae of hemorrhage into the wall of the aorta, with scattered associated irregular calcification in the visualized mural thrombus. This finding is stable on delayed images, without evidence of acute hemorrhage at this time. Aneurysmal dilatation resolves at the level of the aortic bifurcation. There is focal aneurysmal dilatation at the distal right common iliac artery, measuring up to 2.3 cm in diameter. Scattered calcification is noted along the abdominal aorta and its branches. Reproductive: The bladder is moderately distended and grossly unremarkable. The prostate remains normal in size, with minimal calcification. Other: No additional soft tissue abnormalities are seen. Musculoskeletal: No acute osseous abnormalities are identified. The visualized musculature is unremarkable in appearance. IMPRESSION: 1. 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 There appears to be chronic sequelae of hemorrhage into the wall of the aorta, with scattered associated irregular calcification in the visualized mural thrombus. This finding is stable on delayed images, without evidence of acute hemorrhage at this time. Vascular surgery consultation recommended due to increased risk of rupture for AAA >5.5 cm. This recommendation follows ACR consensus guidelines: White Paper of the ACR Incidental Findings Committee II on Vascular Findings. J Am Coll Radiol 2013; 10:789-794. 2. 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 Scattered aortic atherosclerosis noted. 3. Circumferential wall thickening along the mid transverse colon, measuring 7 cm in length, compatible with primary colonic malignancy. This corresponds to the finding on colonoscopy. Associated prominent vasculature noted. 4. Less well defined diffuse wall thickening about the cecum and ileocecal junction. This may reflect underlying inflammation, though given its appearance, a second colonic primary malignancy cannot be excluded. 5. Diffuse metastatic disease throughout the liver, with heterogeneous masses measuring up to 7.5 cm in size, and associated enlargement of the caudate lobe. 6. Mild focal skin thickening along the left lateral chest wall is of uncertain significance, without definite evidence of focal mass. Would correlate for any associated skin findings. 7. Scattered coronary artery calcification noted. 8. Mild right-sided peripheral scarring and trace right-sided pleural fluid noted. Lungs otherwise clear. 9. Small left renal cysts. Nonobstructing 3 mm stone at the interpole region of the right kidney. 10. Small stones and likely sludge within the gallbladder. Gallbladder otherwise unremarkable. These results were called by telephone at the time of interpretation on 12/27/2016 at 2:03 am to Chaney Malling NP, who verbally  acknowledged these results. Electronically Signed   By: Garald Balding M.D.   On: 12/27/2016 02:15   Ct Abdomen Pelvis W Contrast  Result Date: 12/27/2016 CLINICAL  DATA:  Recently diagnosed with colon cancer on endoscopy. Assess for metastatic disease. Initial encounter. EXAM: CT CHEST, ABDOMEN, AND PELVIS WITH CONTRAST TECHNIQUE: Multidetector CT imaging of the chest, abdomen and pelvis was performed following the standard protocol during bolus administration of intravenous contrast. CONTRAST:  178mL ISOVUE-300 IOPAMIDOL (ISOVUE-300) INJECTION 61% COMPARISON:  CTA of the chest performed 01/17/2012, and report from CT of the abdomen and pelvis performed 12/23/2001 FINDINGS: CT CHEST FINDINGS Cardiovascular: Scattered coronary artery calcification is noted. The heart remains normal in size. Minimal mural thrombus is noted along the aortic arch, without evidence of luminal narrowing. The great vessels are grossly unremarkable in appearance. Mediastinum/Nodes: No mediastinal lymphadenopathy is seen. No pericardial effusion is identified. The thyroid gland is grossly unremarkable. No axial lymphadenopathy is appreciated. Lungs/Pleura: Mild right-sided peripheral scarring and trace right-sided pleural fluid is noted. The lungs are otherwise grossly clear. No pneumothorax is seen. No dominant mass is identified within the lungs. Musculoskeletal: No acute osseous abnormalities are identified. The visualized musculature is unremarkable in appearance. Mild skin thickening along the left lateral chest wall is of uncertain significance, without a focal mass. CT ABDOMEN PELVIS FINDINGS Hepatobiliary: Multiple large mildly heterogeneous masses are noted throughout the liver, measuring up to 7.5 cm in size. These are compatible with metastatic disease. There is associated enlargement of the caudate lobe. The gallbladder contains small stones and likely sludge. It is otherwise unremarkable in appearance. The common bile duct  remains normal in caliber. Pancreas: The pancreas is within normal limits. Spleen: The spleen is unremarkable in appearance. Adrenals/Urinary Tract: The adrenal glands are unremarkable in appearance. Small left renal cysts are seen. A nonobstructing 3 mm stone is noted at the interpole region of the right kidney. Nonspecific perinephric stranding is noted bilaterally. There is no evidence of hydronephrosis. Mild left-sided pelvicaliectasis remains within normal limits. No obstructing ureteral stones are identified. Stomach/Bowel: The appendix remains normal in caliber, without evidence of appendicitis. There is circumferential wall thickening along the mid transverse colon, measuring approximately 7 cm in length, compatible with primary colonic malignancy. This corresponds to the finding on colonoscopy. Associated prominent vasculature is noted. There is also less well defined diffuse wall thickening about the cecum and ileocecal junction. This may reflect underlying inflammation, though given its appearance, a second primary malignancy cannot be excluded. The remainder of the colon is grossly unremarkable. The small bowel is unremarkable in appearance. A metallic density is noted at the right mid abdomen, likely reflecting remote injury. The stomach is grossly unremarkable in appearance. Vascular/Lymphatic: There is aneurysmal dilatation of the infrarenal abdominal aorta to 6.8 cm in transverse dimension and 6.5 cm in AP dimension, with associated mural thrombus. There appears to be chronic sequelae of hemorrhage into the wall of the aorta, with scattered associated irregular calcification in the visualized mural thrombus. This finding is stable on delayed images, without evidence of acute hemorrhage at this time. Aneurysmal dilatation resolves at the level of the aortic bifurcation. There is focal aneurysmal dilatation at the distal right common iliac artery, measuring up to 2.3 cm in diameter. Scattered  calcification is noted along the abdominal aorta and its branches. Reproductive: The bladder is moderately distended and grossly unremarkable. The prostate remains normal in size, with minimal calcification. Other: No additional soft tissue abnormalities are seen. Musculoskeletal: No acute osseous abnormalities are identified. The visualized musculature is unremarkable in appearance. IMPRESSION: 1. 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 There appears to be chronic sequelae of hemorrhage into the wall of the aorta, with scattered associated irregular calcification in the visualized mural thrombus. This finding is stable on delayed images, without evidence of acute hemorrhage at this time. Vascular surgery consultation recommended due to increased risk of rupture for AAA >5.5 cm. This recommendation follows ACR consensus guidelines: White Paper of the ACR Incidental Findings Committee II on Vascular Findings. J Am Coll Radiol 2013; 10:789-794. 2. 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 Scattered aortic atherosclerosis noted. 3. Circumferential wall thickening along the mid transverse colon, measuring 7 cm in length, compatible with primary colonic malignancy. This corresponds to the finding on colonoscopy. Associated prominent vasculature noted. 4. Less well defined diffuse wall thickening about the cecum and ileocecal junction. This may reflect underlying inflammation, though given its appearance, a second colonic primary malignancy cannot be excluded. 5. Diffuse metastatic disease throughout the liver, with heterogeneous masses measuring up to 7.5 cm in size, and associated enlargement of the caudate lobe. 6. Mild focal skin thickening along the left lateral chest wall is of uncertain significance, without definite evidence of focal mass.  Would correlate for any associated skin findings. 7. Scattered coronary artery calcification noted. 8. Mild right-sided peripheral scarring and trace right-sided pleural fluid noted. Lungs otherwise clear. 9. Small left renal cysts. Nonobstructing 3 mm stone at the interpole region of the right kidney. 10. Small stones and likely sludge within the gallbladder. Gallbladder otherwise unremarkable. These results were called by telephone at the time of interpretation on 12/27/2016 at 2:03 am to Chaney Malling NP, who verbally acknowledged these results. Electronically Signed   By: Garald Balding M.D.   On: 12/27/2016 02:15    Medications:  Scheduled: . sodium chloride   Intravenous Once  . amiodarone  200 mg Oral Daily  . carvedilol  12.5 mg Oral BID WC  . feeding supplement  1 Container Oral TID BM  . insulin aspart  0-9 Units Subcutaneous Q4H  . pantoprazole  40 mg Intravenous Q12H  . polyethylene glycol  17 g Oral Daily  . ramipril  5 mg Oral Daily  . rosuvastatin  20 mg Oral q1800   Continuous: . dextrose 5 % and 0.45% NaCl 50 mL/hr at 12/27/16 0845    Assessment/Plan: 1) Metastatic colon cancer. 2) Anemia   The patient is fully aware of his diagnosis.  I tried to answer another questions for him and his wife.  He is awaiting for the Oncology consultation.  Plan: 1) Management per Oncology.  LOS: 3 days   Ashad Fawbush D 12/27/2016, 3:44 PM

## 2016-12-27 NOTE — Progress Notes (Signed)
Dr. Lindi Adie verbal order to transfuse 2 units PRBC. MD at bedside  read back used for each order entered.
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
## 2016-12-27 NOTE — Progress Notes (Signed)
CM received consult: Please assist pt in setting up [facility] for home palliative care. CM  made referral to [facility] @ [phone]  for home based palliative care after confirming with pt/wife. [facility] to f/u with pt. Whitman Hero RN,BSN,CM [phone]

## 2016-12-27 NOTE — Progress Notes (Signed)
Nutrition Brief Note  Chart reviewed. Pt now transitioning to home hospice/palliative care. Pt with poor prognosis.  No further nutrition interventions warranted at this time.  Please re-consult as needed.   Minie Roadcap A. Jimmye Norman, RD, LDN, CDE Pager: [phone] After hours Pager: [phone]

## 2016-12-27 NOTE — Consult Note (Signed)
Reason for Consult: Abdominal aortic aneurysm/metastatic CA of colon [facility] Physician[facility]  Norman Rodgers is an 71 y.o. male.  HPI: Patient is 71 year old male with past medical history significant for coronary artery disease history of anteroseptal wall MI in February 2013 status post PCI to mid LAD, history of SVT/atrial tachycardia status post ablation, hypertension, diabetes mellitus, COPD, hyperlipidemia, history of tobacco abuse, history of EtOH abuse, was admitted on 12/24/2016 because of progressive worsening generalized weakness fatigue and shortness of breath and was noted to have hemoglobin of 4. Patient denies any bright red blood per rectum or melena. Denies any NSAIDs abuse. Workup revealed metastatic colon cancer and also was noted to have abdominal aortic aneurysm of 6.8 cm. Patient denies any chest pain nausea vomiting diaphoresis. Patient had nuclear stress test approximately 6 months ago which showed no evidence of reversible ischemia with EF of 50% which has improved markedly from prior echo and cath findings.  Past Medical History:  Diagnosis Date  . Diabetes mellitus without complication ([place])   . Hypertension     Past Surgical History:  Procedure Laterality Date  . LEFT HEART CATHETERIZATION WITH CORONARY ANGIOGRAM N/A 01/22/2012   Procedure: LEFT HEART CATHETERIZATION WITH CORONARY ANGIOGRAM;  Surgeon: Clent Demark, MD;  Location: [facility] CATH LAB;  Service: Cardiovascular;  Laterality: N/A;  . SUPRAVENTRICULAR TACHYCARDIA ABLATION N/A 01/17/2012   Procedure: SUPRAVENTRICULAR TACHYCARDIA ABLATION;  Surgeon: Evans Lance, MD;  Location: [facility] CATH LAB;  Service: Cardiovascular;  Laterality: N/A;    Family History  Problem Relation Age of Onset  . Hypertension Other     Social History:  reports that he has never smoked. He has never used smokeless tobacco. He reports that he drinks alcohol. He reports that he does not use drugs.  Allergies: No Known  Allergies  Medications: I have reviewed the patient's current medications.  Results for orders placed or performed during the hospital encounter of 12/24/16 (from the past 48 hour(s))  CBC with Differential/Platelet     Status: Abnormal   Collection Time: 12/25/16  9:34 AM  Result Value Ref Range   WBC 10.8 (H) 4.0 - 10.5 K/uL   RBC 2.84 (L) 4.22 - 5.81 MIL/uL   Hemoglobin 5.7 (LL) 13.0 - 17.0 g/dL    Comment: REPEATED TO VERIFY CRITICAL VALUE NOTED.  VALUE IS CONSISTENT WITH PREVIOUSLY REPORTED AND CALLED VALUE.    HCT 19.6 (L) 39.0 - 52.0 %   MCV 69.0 (L) 78.0 - 100.0 fL   MCH 20.1 (L) 26.0 - 34.0 pg   MCHC 29.1 (L) 30.0 - 36.0 g/dL   RDW 23.3 (H) 11.5 - 15.5 %   Platelets 349 150 - 400 K/uL    Comment: REPEATED TO VERIFY PLATELET COUNT CONFIRMED BY SMEAR    Neutrophils Relative % 75 %   Lymphocytes Relative 16 %   Monocytes Relative 8 %   Eosinophils Relative 1 %   Basophils Relative 0 %   Neutro Abs 8.1 (H) 1.7 - 7.7 K/uL   Lymphs Abs 1.7 0.7 - 4.0 K/uL   Monocytes Absolute 0.9 0.1 - 1.0 K/uL   Eosinophils Absolute 0.1 0.0 - 0.7 K/uL   Basophils Absolute 0.0 0.0 - 0.1 K/uL   RBC Morphology HYPOCHROMIA     Comment: POLYCHROMASIA PRESENT ELLIPTOCYTES    Smear Review LARGE PLATELETS PRESENT   Basic metabolic panel     Status: Abnormal   Collection Time: 12/25/16  9:34 AM  Result Value Ref Range  Sodium 136 135 - 145 mmol/L   Potassium 3.6 3.5 - 5.1 mmol/L   Chloride 107 101 - 111 mmol/L   CO2 24 22 - 32 mmol/L   Glucose, Bld 89 65 - 99 mg/dL   BUN 13 6 - 20 mg/dL   Creatinine, Ser 1.19 0.61 - 1.24 mg/dL   Calcium 8.3 (L) 8.9 - 10.3 mg/dL   GFR calc non Af Amer >60 >60 mL/min   GFR calc Af Amer >60 >60 mL/min    Comment: (NOTE) The eGFR has been calculated using the CKD EPI equation. This calculation has not been validated in all clinical situations. eGFR's persistently <60 mL/min signify possible Chronic Kidney Disease.    Anion gap 5 5 - 15  Glucose,  capillary     Status: Abnormal   Collection Time: 12/25/16 12:02 PM  Result Value Ref Range   Glucose-Capillary 114 (H) 65 - 99 mg/dL  Prepare RBC     Status: None   Collection Time: 12/25/16  2:25 PM  Result Value Ref Range   Order Confirmation ORDER PROCESSED BY BLOOD BANK   Glucose, capillary     Status: Abnormal   Collection Time: 12/25/16  4:36 PM  Result Value Ref Range   Glucose-Capillary 148 (H) 65 - 99 mg/dL  Hemoglobin and hematocrit, blood     Status: Abnormal   Collection Time: 12/25/16  7:30 PM  Result Value Ref Range   Hemoglobin 6.9 (LL) 13.0 - 17.0 g/dL    Comment: REPEATED TO VERIFY CRITICAL VALUE NOTED.  VALUE IS CONSISTENT WITH PREVIOUSLY REPORTED AND CALLED VALUE.    HCT 23.2 (L) 39.0 - 52.0 %  Glucose, capillary     Status: Abnormal   Collection Time: 12/25/16  9:27 PM  Result Value Ref Range   Glucose-Capillary 111 (H) 65 - 99 mg/dL  Glucose, capillary     Status: Abnormal   Collection Time: 12/26/16 12:47 AM  Result Value Ref Range   Glucose-Capillary 115 (H) 65 - 99 mg/dL  Glucose, capillary     Status: Abnormal   Collection Time: 12/26/16  5:38 AM  Result Value Ref Range   Glucose-Capillary 159 (H) 65 - 99 mg/dL  CBC with Differential/Platelet     Status: Abnormal   Collection Time: 12/26/16  6:25 AM  Result Value Ref Range   WBC 9.3 4.0 - 10.5 K/uL   RBC 3.20 (L) 4.22 - 5.81 MIL/uL   Hemoglobin 6.7 (LL) 13.0 - 17.0 g/dL    Comment: REPEATED TO VERIFY CRITICAL VALUE NOTED.  VALUE IS CONSISTENT WITH PREVIOUSLY REPORTED AND CALLED VALUE.    HCT 23.1 (L) 39.0 - 52.0 %   MCV 72.2 (L) 78.0 - 100.0 fL   MCH 20.9 (L) 26.0 - 34.0 pg   MCHC 29.0 (L) 30.0 - 36.0 g/dL   RDW 24.9 (H) 11.5 - 15.5 %   Platelets 361 150 - 400 K/uL   Neutrophils Relative % 76 %   Lymphocytes Relative 15 %   Monocytes Relative 8 %   Eosinophils Relative 1 %   Basophils Relative 0 %   Neutro Abs 7.1 1.7 - 7.7 K/uL   Lymphs Abs 1.4 0.7 - 4.0 K/uL   Monocytes Absolute 0.7  0.1 - 1.0 K/uL   Eosinophils Absolute 0.1 0.0 - 0.7 K/uL   Basophils Absolute 0.0 0.0 - 0.1 K/uL   RBC Morphology ELLIPTOCYTES     Comment: POLYCHROMASIA PRESENT HYPOCHROMIA   Basic metabolic panel     Status: Abnormal  Collection Time: 12/26/16  6:25 AM  Result Value Ref Range   Sodium 133 (L) 135 - 145 mmol/L   Potassium 3.4 (L) 3.5 - 5.1 mmol/L   Chloride 105 101 - 111 mmol/L   CO2 21 (L) 22 - 32 mmol/L   Glucose, Bld 152 (H) 65 - 99 mg/dL   BUN 13 6 - 20 mg/dL   Creatinine, Ser 1.10 0.61 - 1.24 mg/dL   Calcium 8.3 (L) 8.9 - 10.3 mg/dL   GFR calc non Af Amer >60 >60 mL/min   GFR calc Af Amer >60 >60 mL/min    Comment: (NOTE) The eGFR has been calculated using the CKD EPI equation. This calculation has not been validated in all clinical situations. eGFR's persistently <60 mL/min signify possible Chronic Kidney Disease.    Anion gap 7 5 - 15  Glucose, capillary     Status: Abnormal   Collection Time: 12/26/16  7:44 AM  Result Value Ref Range   Glucose-Capillary 122 (H) 65 - 99 mg/dL  Protime-INR     Status: Abnormal   Collection Time: 12/26/16  8:49 AM  Result Value Ref Range   Prothrombin Time 16.7 (H) 11.4 - 15.2 seconds   INR 1.34   Glucose, capillary     Status: Abnormal   Collection Time: 12/26/16 11:57 AM  Result Value Ref Range   Glucose-Capillary 116 (H) 65 - 99 mg/dL  Glucose, capillary     Status: Abnormal   Collection Time: 12/26/16  5:09 PM  Result Value Ref Range   Glucose-Capillary 116 (H) 65 - 99 mg/dL  Glucose, capillary     Status: Abnormal   Collection Time: 12/26/16  8:01 PM  Result Value Ref Range   Glucose-Capillary 159 (H) 65 - 99 mg/dL   Comment 1 Notify RN    Comment 2 Document in Chart   Glucose, capillary     Status: Abnormal   Collection Time: 12/26/16 11:52 PM  Result Value Ref Range   Glucose-Capillary 125 (H) 65 - 99 mg/dL   Comment 1 Notify RN    Comment 2 Document in Chart   Glucose, capillary     Status: Abnormal   Collection  Time: 12/27/16  4:04 AM  Result Value Ref Range   Glucose-Capillary 136 (H) 65 - 99 mg/dL   Comment 1 Notify RN    Comment 2 Document in Chart   CBC     Status: Abnormal   Collection Time: 12/27/16  6:48 AM  Result Value Ref Range   WBC 7.3 4.0 - 10.5 K/uL   RBC 3.48 (L) 4.22 - 5.81 MIL/uL   Hemoglobin 7.4 (L) 13.0 - 17.0 g/dL    Comment: CONSISTENT WITH PREVIOUS RESULT   HCT 25.9 (L) 39.0 - 52.0 %   MCV 74.4 (L) 78.0 - 100.0 fL   MCH 21.3 (L) 26.0 - 34.0 pg   MCHC 28.6 (L) 30.0 - 36.0 g/dL   RDW 26.8 (H) 11.5 - 15.5 %   Platelets 442 (H) 150 - 400 K/uL  Basic metabolic panel     Status: Abnormal   Collection Time: 12/27/16  6:48 AM  Result Value Ref Range   Sodium 138 135 - 145 mmol/L   Potassium 3.7 3.5 - 5.1 mmol/L   Chloride 106 101 - 111 mmol/L   CO2 22 22 - 32 mmol/L   Glucose, Bld 110 (H) 65 - 99 mg/dL   BUN 8 6 - 20 mg/dL   Creatinine, Ser 0.98 0.61 - 1.24  mg/dL   Calcium 8.8 (L) 8.9 - 10.3 mg/dL   GFR calc non Af Amer >60 >60 mL/min   GFR calc Af Amer >60 >60 mL/min    Comment: (NOTE) The eGFR has been calculated using the CKD EPI equation. This calculation has not been validated in all clinical situations. eGFR's persistently <60 mL/min signify possible Chronic Kidney Disease.    Anion gap 10 5 - 15  Magnesium     Status: None   Collection Time: 12/27/16  6:48 AM  Result Value Ref Range   Magnesium 2.2 1.7 - 2.4 mg/dL  Glucose, capillary     Status: Abnormal   Collection Time: 12/27/16  7:55 AM  Result Value Ref Range   Glucose-Capillary 129 (H) 65 - 99 mg/dL    Ct Chest W Contrast  Result Date: 12/27/2016 CLINICAL DATA:  Recently diagnosed with colon cancer on endoscopy. Assess for metastatic disease. Initial encounter. EXAM: CT CHEST, ABDOMEN, AND PELVIS WITH CONTRAST TECHNIQUE: Multidetector CT imaging of the chest, abdomen and pelvis was performed following the standard protocol during bolus administration of intravenous contrast. CONTRAST:  124m  ISOVUE-300 IOPAMIDOL (ISOVUE-300) INJECTION 61% COMPARISON:  CTA of the chest performed 01/17/2012, and report from CT of the abdomen and pelvis performed 12/23/2001 FINDINGS: CT CHEST FINDINGS Cardiovascular: Scattered coronary artery calcification is noted. The heart remains normal in size. Minimal mural thrombus is noted along the aortic arch, without evidence of luminal narrowing. The great vessels are grossly unremarkable in appearance. Mediastinum/Nodes: No mediastinal lymphadenopathy is seen. No pericardial effusion is identified. The thyroid gland is grossly unremarkable. No axial lymphadenopathy is appreciated. Lungs/Pleura: Mild right-sided peripheral scarring and trace right-sided pleural fluid is noted. The lungs are otherwise grossly clear. No pneumothorax is seen. No dominant mass is identified within the lungs. Musculoskeletal: No acute osseous abnormalities are identified. The visualized musculature is unremarkable in appearance. Mild skin thickening along the left lateral chest wall is of uncertain significance, without a focal mass. CT ABDOMEN PELVIS FINDINGS Hepatobiliary: Multiple large mildly heterogeneous masses are noted throughout the liver, measuring up to 7.5 cm in size. These are compatible with metastatic disease. There is associated enlargement of the caudate lobe. The gallbladder contains small stones and likely sludge. It is otherwise unremarkable in appearance. The common bile duct remains normal in caliber. Pancreas: The pancreas is within normal limits. Spleen: The spleen is unremarkable in appearance. Adrenals/Urinary Tract: The adrenal glands are unremarkable in appearance. Small left renal cysts are seen. A nonobstructing 3 mm stone is noted at the interpole region of the right kidney. Nonspecific perinephric stranding is noted bilaterally. There is no evidence of hydronephrosis. Mild left-sided pelvicaliectasis remains within normal limits. No obstructing ureteral stones are  identified. Stomach/Bowel: The appendix remains normal in caliber, without evidence of appendicitis. There is circumferential wall thickening along the mid transverse colon, measuring approximately 7 cm in length, compatible with primary colonic malignancy. This corresponds to the finding on colonoscopy. Associated prominent vasculature is noted. There is also less well defined diffuse wall thickening about the cecum and ileocecal junction. This may reflect underlying inflammation, though given its appearance, a second primary malignancy cannot be excluded. The remainder of the colon is grossly unremarkable. The small bowel is unremarkable in appearance. A metallic density is noted at the right mid abdomen, likely reflecting remote injury. The stomach is grossly unremarkable in appearance. Vascular/Lymphatic: There is aneurysmal dilatation of the infrarenal abdominal aorta to 6.8 cm in transverse dimension and 6.5 cm in AP dimension,  with associated mural thrombus. There appears to be chronic sequelae of hemorrhage into the wall of the aorta, with scattered associated irregular calcification in the visualized mural thrombus. This finding is stable on delayed images, without evidence of acute hemorrhage at this time. Aneurysmal dilatation resolves at the level of the aortic bifurcation. There is focal aneurysmal dilatation at the distal right common iliac artery, measuring up to 2.3 cm in diameter. Scattered calcification is noted along the abdominal aorta and its branches. Reproductive: The bladder is moderately distended and grossly unremarkable. The prostate remains normal in size, with minimal calcification. Other: No additional soft tissue abnormalities are seen. Musculoskeletal: No acute osseous abnormalities are identified. The visualized musculature is unremarkable in appearance. IMPRESSION: 1. Aneurysmal dilatation of the infrarenal abdominal aorta to 6.8 cm in transverse dimension and 6.5 cm in AP  dimension, with associated mural thrombus. There appears to be chronic sequelae of hemorrhage into the wall of the aorta, with scattered associated irregular calcification in the visualized mural thrombus. This finding is stable on delayed images, without evidence of acute hemorrhage at this time. Vascular surgery consultation recommended due to increased risk of rupture for AAA >5.5 cm. This recommendation follows ACR consensus guidelines: White Paper of the ACR Incidental Findings Committee II on Vascular Findings. J Am Coll Radiol 2013; 10:789-794. 2. 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 Scattered aortic atherosclerosis noted. 3. Circumferential wall thickening along the mid transverse colon, measuring 7 cm in length, compatible with primary colonic malignancy. This corresponds to the finding on colonoscopy. Associated prominent vasculature noted. 4. Less well defined diffuse wall thickening about the cecum and ileocecal junction. This may reflect underlying inflammation, though given its appearance, a second colonic primary malignancy cannot be excluded. 5. Diffuse metastatic disease throughout the liver, with heterogeneous masses measuring up to 7.5 cm in size, and associated enlargement of the caudate lobe. 6. Mild focal skin thickening along the left lateral chest wall is of uncertain significance, without definite evidence of focal mass. Would correlate for any associated skin findings. 7. Scattered coronary artery calcification noted. 8. Mild right-sided peripheral scarring and trace right-sided pleural fluid noted. Lungs otherwise clear. 9. Small left renal cysts. Nonobstructing 3 mm stone at the interpole region of the right kidney. 10. Small stones and likely sludge within the gallbladder. Gallbladder otherwise unremarkable. These results were called by telephone at the time of interpretation on 12/27/2016 at 2:03 am to Chaney Malling  NP, who verbally acknowledged these results. Electronically Signed   By: Garald Balding M.D.   On: 12/27/2016 02:15   Ct Abdomen Pelvis W Contrast  Result Date: 12/27/2016 CLINICAL DATA:  Recently diagnosed with colon cancer on endoscopy. Assess for metastatic disease. Initial encounter. EXAM: CT CHEST, ABDOMEN, AND PELVIS WITH CONTRAST TECHNIQUE: Multidetector CT imaging of the chest, abdomen and pelvis was performed following the standard protocol during bolus administration of intravenous contrast. CONTRAST:  130m ISOVUE-300 IOPAMIDOL (ISOVUE-300) INJECTION 61% COMPARISON:  CTA of the chest performed 01/17/2012, and report from CT of the abdomen and pelvis performed 12/23/2001 FINDINGS: CT CHEST FINDINGS Cardiovascular: Scattered coronary artery calcification is noted. The heart remains normal in size. Minimal mural thrombus is noted along the aortic arch, without evidence of luminal narrowing. The great vessels are grossly unremarkable in appearance. Mediastinum/Nodes: No mediastinal lymphadenopathy is seen. No pericardial effusion is identified. The thyroid gland is grossly unremarkable. No axial lymphadenopathy is appreciated. Lungs/Pleura: Mild right-sided peripheral scarring and  trace right-sided pleural fluid is noted. The lungs are otherwise grossly clear. No pneumothorax is seen. No dominant mass is identified within the lungs. Musculoskeletal: No acute osseous abnormalities are identified. The visualized musculature is unremarkable in appearance. Mild skin thickening along the left lateral chest wall is of uncertain significance, without a focal mass. CT ABDOMEN PELVIS FINDINGS Hepatobiliary: Multiple large mildly heterogeneous masses are noted throughout the liver, measuring up to 7.5 cm in size. These are compatible with metastatic disease. There is associated enlargement of the caudate lobe. The gallbladder contains small stones and likely sludge. It is otherwise unremarkable in appearance. The  common bile duct remains normal in caliber. Pancreas: The pancreas is within normal limits. Spleen: The spleen is unremarkable in appearance. Adrenals/Urinary Tract: The adrenal glands are unremarkable in appearance. Small left renal cysts are seen. A nonobstructing 3 mm stone is noted at the interpole region of the right kidney. Nonspecific perinephric stranding is noted bilaterally. There is no evidence of hydronephrosis. Mild left-sided pelvicaliectasis remains within normal limits. No obstructing ureteral stones are identified. Stomach/Bowel: The appendix remains normal in caliber, without evidence of appendicitis. There is circumferential wall thickening along the mid transverse colon, measuring approximately 7 cm in length, compatible with primary colonic malignancy. This corresponds to the finding on colonoscopy. Associated prominent vasculature is noted. There is also less well defined diffuse wall thickening about the cecum and ileocecal junction. This may reflect underlying inflammation, though given its appearance, a second primary malignancy cannot be excluded. The remainder of the colon is grossly unremarkable. The small bowel is unremarkable in appearance. A metallic density is noted at the right mid abdomen, likely reflecting remote injury. The stomach is grossly unremarkable in appearance. Vascular/Lymphatic: There is aneurysmal dilatation of the infrarenal abdominal aorta to 6.8 cm in transverse dimension and 6.5 cm in AP dimension, with associated mural thrombus. There appears to be chronic sequelae of hemorrhage into the wall of the aorta, with scattered associated irregular calcification in the visualized mural thrombus. This finding is stable on delayed images, without evidence of acute hemorrhage at this time. Aneurysmal dilatation resolves at the level of the aortic bifurcation. There is focal aneurysmal dilatation at the distal right common iliac artery, measuring up to 2.3 cm in diameter.  Scattered calcification is noted along the abdominal aorta and its branches. Reproductive: The bladder is moderately distended and grossly unremarkable. The prostate remains normal in size, with minimal calcification. Other: No additional soft tissue abnormalities are seen. Musculoskeletal: No acute osseous abnormalities are identified. The visualized musculature is unremarkable in appearance. IMPRESSION: 1. 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 There appears to be chronic sequelae of hemorrhage into the wall of the aorta, with scattered associated irregular calcification in the visualized mural thrombus. This finding is stable on delayed images, without evidence of acute hemorrhage at this time. Vascular surgery consultation recommended due to increased risk of rupture for AAA >5.5 cm. This recommendation follows ACR consensus guidelines: White Paper of the ACR Incidental Findings Committee II on Vascular Findings. J Am Coll Radiol 2013; 10:789-794. 2. 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 Scattered aortic atherosclerosis noted. 3. Circumferential wall thickening along the mid transverse colon, measuring 7 cm in length, compatible with primary colonic malignancy. This corresponds to the finding on colonoscopy. Associated prominent vasculature noted. 4. Less well defined diffuse wall  thickening about the cecum and ileocecal junction. This may reflect underlying inflammation, though given its appearance, a second colonic primary malignancy cannot be excluded. 5. Diffuse metastatic disease throughout the liver, with heterogeneous masses measuring up to 7.5 cm in size, and associated enlargement of the caudate lobe. 6. Mild focal skin thickening along the left lateral chest wall is of uncertain significance, without definite evidence of focal  mass. Would correlate for any associated skin findings. 7. Scattered coronary artery calcification noted. 8. Mild right-sided peripheral scarring and trace right-sided pleural fluid noted. Lungs otherwise clear. 9. Small left renal cysts. Nonobstructing 3 mm stone at the interpole region of the right kidney. 10. Small stones and likely sludge within the gallbladder. Gallbladder otherwise unremarkable. These results were called by telephone at the time of interpretation on 12/27/2016 at 2:03 am to Chaney Malling NP, who verbally acknowledged these results. Electronically Signed   By: Garald Balding M.D.   On: 12/27/2016 02:15    Review of Systems  Constitutional: Positive for malaise/fatigue and weight loss.  Cardiovascular: Positive for chest pain. Negative for palpitations, orthopnea, claudication and leg swelling.  Gastrointestinal: Negative for abdominal pain, nausea and vomiting.  Genitourinary: Negative for dysuria.  Neurological: Positive for weakness. Negative for dizziness.   Blood pressure (!) 109/59, pulse 67, temperature 99.8 F (37.7 C), temperature source Oral, resp. rate 18, SpO2 99 %. Physical Exam  HENT:  Head: Normocephalic and atraumatic.  Eyes: Conjunctivae are normal. Pupils are equal, round, and reactive to light.  Neck: Normal range of motion. Neck supple. No JVD present. No tracheal deviation present. No thyromegaly present.  Cardiovascular: Normal rate and regular rhythm.   Murmur (is soft systolic murmur noted no S3 gallop) heard. Respiratory: Effort normal and breath sounds normal. No respiratory distress. He has no wheezes. He has no rales.  GI: Soft. Bowel sounds are normal. He exhibits distension. There is no tenderness. There is no rebound.  Musculoskeletal: He exhibits no edema, tenderness or deformity.    Assessment/Plan: Metastatic CA of the colon Status post symptomatic anemia secondary to above Infrarenal Abdominal aortic aneurysm with evidence of mural  thrombus. Coronary artery disease history of anteroseptal wall MI in the past status post PCI to LAD stable Hypertension Diabetes mellitus COPD History of tobacco abuse History of EtOH abuse Plan Continue present management. Consider vascular surgical service consult for possible endovascular graft for infrarenal abdominal aortic aneurysm. No further cardiac interventions at this point Prognosis guarded  Charolette Forward 12/27/2016, 9:32 AM

## 2016-12-27 NOTE — Consult Note (Signed)
Consultation Note Date: 12/27/2016   Patient Name: Norman Rodgers  DOB: [birth year]  MRN: [id number]  Age / Sex: 71 y.o., male  PCP: Wallene Huh, MD Referring Physician: Florencia Reasons, MD  Reason for Consultation: Establishing goals of care  HPI/Patient Profile: 71 y.o. male  with past medical history of CAD, MI (2013), sCHF EF 50%, SVT s/p ablation, HTN, DM2, and COPD who presented to the ED with episodic epistaxis, SOB,  and generalized weakness, and was admitted on 12/24/2016 with Hgb 4 and stool positive for occult blood. Subsequent work-up revealed a large near-obstructing circumferential mass in the colon, with staging CT noting diffuse metastatic disease in the liver and wall-thickening at the cecum and ileocecal junction. 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   Clinical Assessment and Goals of Care: I met with Norman Rodgers and his wife at his bedside. He had been told earlier that morning by a Cardiology that he had metastatic cancer, with that information then followed by vascular surgery who informed him of the AAA. He had not yet spoken with Oncology or Surgery.   When I saw him he was extremely frustrated, overwhelmed, and anxious for discharge. He felt like the information of the cancer hit him "like a brick wall," and he wants no one but his wife to know. He is anxious to know what his options for management are, but was quick to tell me he did not want chemotherapy or radiation. He had a close family member go through cancer treatment (unclear on what type of cancer she had), and he had no intention of suffering the side effects that she did. I did bring up the possibility of other treatment options, such as surgery, which he said he was willing to hear about.   Overall, Norman Rodgers is focused on using whatever time he has left to be with his wife and ride his motorcycle. Right now, he needs to know prognosis, what options there are for  treatment, and what the expected side effects are with and without treatment. He also wants to be out of the hospital as soon as possible.  Primary Decision Maker PATIENT   SUMMARY OF RECOMMENDATIONS    Oncology and general surgery need to weigh-in and discuss prognosis, treatment options, and expected side effects with and without treatment  I am concerned Palliative will not have a chance to follow-up given his focus on being discharged ASAP, in that event, I've provided him with a list of options (see below):  1. If he wants treatment with Oncology and/or surgery  Option 1: F/u with that group AND use [facility] for Palliative support.  2. If he doesn't want treatment with Oncology or surgery  Option 1: [facility] with limited transfusions  Option 2: Transfusions through [facility] for Palliative Care  3. If he wants to think about his options further  Option 1: Meet with Wadie Lessen at [facility] on Monday for [place] discussion; he was given her   number to call and make an appointment (needs to call before the weekend)  **Based on his decision about the above three options, case management will need to be involved to facilitate referals for [facility], [facility], and [facility].  ADDENDUM: As I went through these options with him, he indicated that he would want transfusions for as long as they were beneficial. Consequently, the limited transfusions [facility] would provide will likely not meet this needs. I recommended transfusions  through Oncology clinic and [facility] for Palliative Support. CM asked to facilitate [facility] referal.  Code Status/Advance Care Planning:  DNR   Psycho-social/Spiritual:   Desire for further Chaplaincy support:no  Additional Recommendations: TBD  Prognosis:   Unable to determine  Discharge Planning: To Be Determined      Primary Diagnoses: Present on Admission: . Symptomatic  anemia . Chronic systolic CHF (congestive heart failure) ([place]) . GI bleeding . CAD (coronary artery disease) . Hypertension . Acute GI bleeding   I have reviewed the medical record, interviewed the patient and family, and examined the patient. The following aspects are pertinent.  Past Medical History:  Diagnosis Date  . Diabetes mellitus without complication ([place])   . Hypertension    Social History   Social History  . Marital status: Married    Spouse name: N/A  . Number of children: N/A  . Years of education: N/A   Social History Main Topics  . Smoking status: Never Smoker  . Smokeless tobacco: Never Used  . Alcohol use Yes  . Drug use: No  . Sexual activity: Not Asked   Other Topics Concern  . None   Social History Narrative  . None   Family History  Problem Relation Age of Onset  . Hypertension Other    Scheduled Meds: . sodium chloride   Intravenous Once  . amiodarone  200 mg Oral Daily  . carvedilol  12.5 mg Oral BID WC  . feeding supplement  1 Container Oral TID BM  . insulin aspart  0-9 Units Subcutaneous Q4H  . pantoprazole  40 mg Intravenous Q12H  . polyethylene glycol  17 g Oral Daily  . ramipril  5 mg Oral Daily  . rosuvastatin  20 mg Oral q1800   Continuous Infusions: . dextrose 5 % and 0.45% NaCl 50 mL/hr at 12/27/16 0845   PRN Meds:.acetaminophen **OR** acetaminophen, nitroGLYCERIN, ondansetron **OR** ondansetron (ZOFRAN) IV No Known Allergies   Review of Systems  Constitutional: Positive for activity change, appetite change, fatigue and unexpected weight change.  HENT: Positive for nosebleeds.   Respiratory: Positive for shortness of breath.   Gastrointestinal: Negative for abdominal pain, anal bleeding, blood in stool, constipation, diarrhea, nausea and vomiting.  Genitourinary: Negative for difficulty urinating.  Musculoskeletal: Negative for back pain.  Neurological: Positive for weakness. Negative for dizziness.  Hematological:  Bruises/bleeds easily.  Psychiatric/Behavioral: The patient is nervous/anxious.     Physical Exam  Constitutional: He is oriented to person, place, and time. He appears well-developed and well-nourished. He appears distressed.  HENT:  Head: Normocephalic and atraumatic.  Mouth/Throat: No oropharyngeal exudate.  Eyes: EOM are normal.  Neck: Normal range of motion.  Cardiovascular: Normal rate.   Pulmonary/Chest:  Increased SOB with prolonged conversation  Musculoskeletal: Normal range of motion.  Neurological: He is alert and oriented to person, place, and time.  Skin: Skin is warm and dry.  Psychiatric: Judgment and thought content normal. His mood appears anxious. His affect is angry and labile. His speech is rapid and/or pressured. He is agitated, aggressive and combative. Cognition and memory are normal.    Vital Signs: BP (!) 109/59 (BP Location: Left Arm)   Pulse 67   Temp 99.8 F (37.7 C) (Oral)   Resp 18   SpO2 99%  Pain Assessment: No/denies pain   Pain Score: 0-No pain   SpO2: SpO2: 99 % O2 Device:SpO2: 99 % O2 Flow Rate: .O2 Flow Rate (L/min): 2 L/min  IO: Intake/output summary:  Intake/Output Summary (Last 24 hours) at 12/27/16 1258 Last data filed at 12/27/16 2419  Gross per 24 hour  Intake             1180 ml  Output             1350 ml  Net             -170 ml    LBM: Last BM Date: 12/26/16 Baseline Weight:   Most recent weight:       Palliative Assessment/Data: PPS 80%    Time Total: 70 minutes Greater than 50%  of this time was spent counseling and coordinating care related to the above assessment and plan.  Signed by: Charlynn Court, NP Palliative Medicine Team Pager # [phone] (M-F 7a-5p) Team Phone # [phone] (Nights/Weekends)

## 2016-12-27 NOTE — Consult Note (Signed)
[place] NOTE  Patient Care Team: Wallene Huh, MD as PCP - General (Cardiology) Consulting Physician: Dr.Xu CHIEF COMPLAINTS/PURPOSE OF CONSULTATION:  Newly diagnosed Metastatic colon cancer  HISTORY OF PRESENTING ILLNESS:  Norman Rodgers 71 y.o. male is here because of recent diagnosis of met Colon cancer with liver mets. He presented with syncope and severe anemia and got transfused. He has a colonoscopy which revealed a colon mass and CT scans revealed ext liver mets. He also has a very large aneurysm. He still feels dizzy and SOB I reviewed her records extensively and collaborated the history with the patient.  MEDICAL HISTORY:  Past Medical History:  Diagnosis Date  . Diabetes mellitus without complication ([place])   . Hypertension     SURGICAL HISTORY: Past Surgical History:  Procedure Laterality Date  . COLONOSCOPY N/A 12/26/2016   Procedure: COLONOSCOPY;  Surgeon: Juanita Craver, MD;  Location: [facility] ENDOSCOPY;  Service: Endoscopy;  Laterality: N/A;  . ESOPHAGOGASTRODUODENOSCOPY N/A 12/26/2016   Procedure: ESOPHAGOGASTRODUODENOSCOPY (EGD);  Surgeon: Juanita Craver, MD;  Location: [facility] ENDOSCOPY;  Service: Endoscopy;  Laterality: N/A;  . LEFT HEART CATHETERIZATION WITH CORONARY ANGIOGRAM N/A 01/22/2012   Procedure: LEFT HEART CATHETERIZATION WITH CORONARY ANGIOGRAM;  Surgeon: Clent Demark, MD;  Location: [facility] CATH LAB;  Service: Cardiovascular;  Laterality: N/A;  . SUPRAVENTRICULAR TACHYCARDIA ABLATION N/A 01/17/2012   Procedure: SUPRAVENTRICULAR TACHYCARDIA ABLATION;  Surgeon: Evans Lance, MD;  Location: [facility] CATH LAB;  Service: Cardiovascular;  Laterality: N/A;    SOCIAL HISTORY: Social History   Social History  . Marital status: Married    Spouse name: N/A  . Number of children: N/A  . Years of education: N/A   Occupational History  . Not on file.   Social History Main Topics  . Smoking status: Never Smoker  . Smokeless tobacco: Never Used  .  Alcohol use Yes  . Drug use: No  . Sexual activity: Not on file   Other Topics Concern  . Not on file   Social History Narrative  . No narrative on file    FAMILY HISTORY: Family History  Problem Relation Age of Onset  . Hypertension Other     ALLERGIES:  has No Known Allergies.  MEDICATIONS:  Current Facility-Administered Medications  Medication Dose Route Frequency Provider Last Rate Last Dose  . 0.9 %  sodium chloride infusion   Intravenous Once Nita Sells, MD      . 0.9 %  sodium chloride infusion   Intravenous Once Nicholas Lose, MD      . acetaminophen (TYLENOL) tablet 650 mg  650 mg Oral Q6H PRN Rise Patience, MD   650 mg at 12/25/16 2229   Or  . acetaminophen (TYLENOL) suppository 650 mg  650 mg Rectal Q6H PRN Rise Patience, MD      . acetaminophen (TYLENOL) tablet 650 mg  650 mg Oral Once Nicholas Lose, MD      . amiodarone (PACERONE) tablet 200 mg  200 mg Oral Daily Rise Patience, MD   200 mg at 12/27/16 0901  . carvedilol (COREG) tablet 12.5 mg  12.5 mg Oral BID WC Rise Patience, MD   12.5 mg at 12/27/16 0901  . dextrose 5 %-0.45 % sodium chloride infusion   Intravenous Continuous Nita Sells, MD 50 mL/hr at 12/27/16 0845    . diphenhydrAMINE (BENADRYL) capsule 25 mg  25 mg Oral Once Nicholas Lose, MD      . feeding supplement (BOOST / RESOURCE  BREEZE) liquid 1 Container  1 Container Oral TID BM Florencia Reasons, MD   1 Container at 12/26/16 2155  . insulin aspart (novoLOG) injection 0-9 Units  0-9 Units Subcutaneous Q4H Rise Patience, MD   1 Units at 12/27/16 [phone]  . nitroGLYCERIN (NITROSTAT) SL tablet 0.4 mg  0.4 mg Sublingual Q5 Min x 3 PRN Rise Patience, MD      . ondansetron [facility]) tablet 4 mg  4 mg Oral Q6H PRN Rise Patience, MD       Or  . ondansetron [facility]) injection 4 mg  4 mg Intravenous Q6H PRN Rise Patience, MD      . pantoprazole (PROTONIX) injection 40 mg  40 mg Intravenous Q12H Rise Patience, MD   40 mg at 12/27/16 0846  . polyethylene glycol (MIRALAX / GLYCOLAX) packet 17 g  17 g Oral Daily Nita Sells, MD   17 g at 12/25/16 1713  . ramipril (ALTACE) capsule 5 mg  5 mg Oral Daily Rise Patience, MD   5 mg at 12/27/16 0901  . rosuvastatin (CRESTOR) tablet 20 mg  20 mg Oral q1800 Rise Patience, MD   20 mg at 12/26/16 1803    REVIEW OF SYSTEMS:   Constitutional: Denies fevers, chills or abnormal night sweats; Dizziness Eyes: Denies blurriness of vision, double vision or watery eyes Ears, nose, mouth, throat, and face: Denies mucositis or sore throat Respiratory: Denies cough, dyspnea or wheezes Cardiovascular: Denies palpitation, chest discomfort or lower extremity swelling Gastrointestinal:  Denies nausea, heartburn or change in bowel habits Skin: Denies abnormal skin rashes Lymphatics: Denies new lymphadenopathy or easy bruising Neurological:Denies numbness, tingling or new weaknesses Behavioral/Psych: Mood is stable, no new changes   All other systems were reviewed with the patient and are negative.  PHYSICAL EXAMINATION: ECOG PERFORMANCE STATUS: 2 - Symptomatic, <50% confined to bed  Vitals:   12/27/16 0605 12/27/16 1526  BP: (!) 109/59 115/63  Pulse: 67 71  Resp: 18 18  Temp: 99.8 F (37.7 C) 98.1 F (36.7 C)   There were no vitals filed for this visit.  GENERAL:alert, no distress and comfortable SKIN: skin color, texture, turgor are normal, no rashes or significant lesions EYES: normal, conjunctiva are pink and non-injected, sclera clear OROPHARYNX:no exudate, no erythema and lips, buccal mucosa, and tongue normal  NECK: supple, thyroid normal size, non-tender, without nodularity LYMPH:  no palpable lymphadenopathy in the cervical, axillary or inguinal LUNGS: clear to auscultation and percussion with normal breathing effort HEART: regular rate & rhythm and no murmurs and no lower extremity edema ABDOMEN:abdomen soft, non-tender  and normal bowel sounds Musculoskeletal:no cyanosis of digits and no clubbing  PSYCH: alert & oriented x 3 with fluent speech NEURO: no focal motor/sensory deficits  LABORATORY DATA:  I have reviewed the data as listed Lab Results  Component Value Date   WBC 7.3 12/27/2016   HGB 7.4 (L) 12/27/2016   HCT 25.9 (L) 12/27/2016   MCV 74.4 (L) 12/27/2016   PLT 442 (H) 12/27/2016   Lab Results  Component Value Date   NA 138 12/27/2016   K 3.7 12/27/2016   CL 106 12/27/2016   CO2 22 12/27/2016    RADIOGRAPHIC STUDIES: I have personally reviewed the radiological reports and agreed with the findings in the report.  ASSESSMENT AND PLAN:  Metastatic Colon cancer with liver mets: Explained the cancer to him and his wife in great detail. Discussed treatment options inclusing chemo with 5FU, Oxaliplatin and  avastin Discussed the prognosis with and without chemo. Patient was very clear that he does not want chemo He wants to be kept comfortable with PRN blood transfusions.  Plan: 1. Hospice 2. F/U in my office in 2 weeks for transfusions if he is symptomatic (as long as he is physically able to come) I anticipate that he would have a short life expectancy (<6 months) I explained this to the patient and he understood. 3. Transfuse 2 more units for symptomatic anemia before discharge   All questions were answered. The patient knows to call the clinic with any problems, questions or concerns.    Rulon Eisenmenger, MD @T @

## 2016-12-27 NOTE — Consult Note (Signed)
[facility] Surgery Consult Note  Norman Rodgers [birth year]  [id number].    Requesting MD: Erlinda Hong, MD Chief Complaint/Reason for Consult: colon mass HPI:  Norman Rodgers is a 71 y.o. male with history of CAD status post stenting in 4098, systolic CHF with last EF measured was 50%, AAA, COPD, diabetes mellitus type 2, recurrent epistaxis, and hypertension presented to the ED 12/23/16 because of increasing fatigue and DOE. Associated sxs include 25 lb weight loss over the course of two months. In the ED hgb was 4.0 and guiac positive on FOBT. He denies melena or hematochezia. Patient was admitted to the hospital for further workup and administration of pRBC's.   GI was consulted and performed a EGD/colonoscopy on 12/26/16. EGD revealed a normal esophagus, stomach, and duodenum. Colonoscopy significant for a 6 mm polyp in descending colon (removed) and an infiltrative near-obstructing, circumferential large mass in the mid-ascending colon (biopsied, path pending). The cecum could not be visualized during the procedure. CT chest/abdomen/pelvis performed for staging was significant for diffuse metastatic disease throughout the liver and wall-thickening at the cecum and ileocecal junction. CT scan also significant for AAA > 5.5cm w/ evidence of mural thrombus. General surgery has been asked to provide surgical recommendations regarding colon mass.   He has a long history of EtOH abuse. He is a former smoker who quit smoking 20 years ago. FHx of GI malignancy.   ROS: Review of Systems  Constitutional: Positive for weight loss. Negative for chills and fever.  Respiratory: Positive for shortness of breath. Negative for cough and hemoptysis.   Cardiovascular: Negative for chest pain.  Gastrointestinal: Negative for abdominal pain, blood in stool, constipation, diarrhea, melena, nausea and vomiting.  Genitourinary: Negative.   All other systems reviewed and are negative.   Family History  Problem  Relation Age of Onset  . Hypertension Other     Past Medical History:  Diagnosis Date  . Diabetes mellitus without complication ([place])   . Hypertension     Past Surgical History:  Procedure Laterality Date  . LEFT HEART CATHETERIZATION WITH CORONARY ANGIOGRAM N/A 01/22/2012   Procedure: LEFT HEART CATHETERIZATION WITH CORONARY ANGIOGRAM;  Surgeon: Clent Demark, MD;  Location: [facility] CATH LAB;  Service: Cardiovascular;  Laterality: N/A;  . SUPRAVENTRICULAR TACHYCARDIA ABLATION N/A 01/17/2012   Procedure: SUPRAVENTRICULAR TACHYCARDIA ABLATION;  Surgeon: Evans Lance, MD;  Location: [facility] CATH LAB;  Service: Cardiovascular;  Laterality: N/A;    Social History:  reports that he has never smoked. He has never used smokeless tobacco. He reports that he drinks alcohol. He reports that he does not use drugs.  Allergies: No Known Allergies  Medications Prior to Admission  Medication Sig Dispense Refill  . amiodarone (PACERONE) 200 MG tablet Take 1 tablet (200 mg total) by mouth daily. 30 tablet 3  . aspirin EC 81 MG tablet Take 81 mg by mouth daily.    . carvedilol (COREG) 12.5 MG tablet Take 1 tablet (12.5 mg total) by mouth 2 (two) times daily with a meal. 60 tablet 3  . glimepiride (AMARYL) 4 MG tablet Take 4 mg by mouth daily with breakfast.    . nitroGLYCERIN (NITROSTAT) 0.4 MG SL tablet Place 1 tablet (0.4 mg total) under the tongue every 5 (five) minutes x 3 doses as needed for chest pain. 25 tablet 3  . ramipril (ALTACE) 2.5 MG capsule Take 2 capsules (5 mg total) by mouth daily. 30 capsule 3  . rosuvastatin (CRESTOR) 40 MG tablet Take 0.5 tablets (20  mg total) by mouth daily at 6 PM. 30 tablet 3    Blood pressure (!) 109/59, pulse 67, temperature 99.8 F (37.7 C), temperature source Oral, resp. rate 18, SpO2 99 %. Physical Exam: General: pleasant, obese AA male sitting up in a chair in NAD HEENT: head is normocephalic, atraumatic.  Sclera are noninjected. Ears and nose without any  masses or lesions.  Mouth is pink and moist Heart: regular, rate, and rhythm.  No obvious murmurs, gallops, or rubs noted.  Palpable pedal pulses bilaterally Lungs: CTAB, no wheezes, rhonchi, or rales noted.  Respiratory effort nonlabored Abd: soft, non-tender, non-distended, +BS, no masses, hernias, or organomegaly MS: all 4 extremities are symmetrical with no cyanosis, clubbing, or edema. Ecchymosis left forearm Skin: warm and dry with no masses, lesions, or rashes Psych: A&Ox3 with an appropriate affect - appropriately upset  Neuro: CM 2-12 intact, extremity CSM intact bilaterally, normal speech  Results for orders placed or performed during the hospital encounter of 12/24/16 (from the past 48 hour(s))  Prepare RBC     Status: None   Collection Time: 12/25/16  2:25 PM  Result Value Ref Range   Order Confirmation ORDER PROCESSED BY BLOOD BANK   Glucose, capillary     Status: Abnormal   Collection Time: 12/25/16  4:36 PM  Result Value Ref Range   Glucose-Capillary 148 (H) 65 - 99 mg/dL  Hemoglobin and hematocrit, blood     Status: Abnormal   Collection Time: 12/25/16  7:30 PM  Result Value Ref Range   Hemoglobin 6.9 (LL) 13.0 - 17.0 g/dL    Comment: REPEATED TO VERIFY CRITICAL VALUE NOTED.  VALUE IS CONSISTENT WITH PREVIOUSLY REPORTED AND CALLED VALUE.    HCT 23.2 (L) 39.0 - 52.0 %  Glucose, capillary     Status: Abnormal   Collection Time: 12/25/16  9:27 PM  Result Value Ref Range   Glucose-Capillary 111 (H) 65 - 99 mg/dL  Glucose, capillary     Status: Abnormal   Collection Time: 12/26/16 12:47 AM  Result Value Ref Range   Glucose-Capillary 115 (H) 65 - 99 mg/dL  Glucose, capillary     Status: Abnormal   Collection Time: 12/26/16  5:38 AM  Result Value Ref Range   Glucose-Capillary 159 (H) 65 - 99 mg/dL  CBC with Differential/Platelet     Status: Abnormal   Collection Time: 12/26/16  6:25 AM  Result Value Ref Range   WBC 9.3 4.0 - 10.5 K/uL   RBC 3.20 (L) 4.22 - 5.81  MIL/uL   Hemoglobin 6.7 (LL) 13.0 - 17.0 g/dL    Comment: REPEATED TO VERIFY CRITICAL VALUE NOTED.  VALUE IS CONSISTENT WITH PREVIOUSLY REPORTED AND CALLED VALUE.    HCT 23.1 (L) 39.0 - 52.0 %   MCV 72.2 (L) 78.0 - 100.0 fL   MCH 20.9 (L) 26.0 - 34.0 pg   MCHC 29.0 (L) 30.0 - 36.0 g/dL   RDW 24.9 (H) 11.5 - 15.5 %   Platelets 361 150 - 400 K/uL   Neutrophils Relative % 76 %   Lymphocytes Relative 15 %   Monocytes Relative 8 %   Eosinophils Relative 1 %   Basophils Relative 0 %   Neutro Abs 7.1 1.7 - 7.7 K/uL   Lymphs Abs 1.4 0.7 - 4.0 K/uL   Monocytes Absolute 0.7 0.1 - 1.0 K/uL   Eosinophils Absolute 0.1 0.0 - 0.7 K/uL   Basophils Absolute 0.0 0.0 - 0.1 K/uL   RBC Morphology ELLIPTOCYTES  Comment: POLYCHROMASIA PRESENT HYPOCHROMIA   Basic metabolic panel     Status: Abnormal   Collection Time: 12/26/16  6:25 AM  Result Value Ref Range   Sodium 133 (L) 135 - 145 mmol/L   Potassium 3.4 (L) 3.5 - 5.1 mmol/L   Chloride 105 101 - 111 mmol/L   CO2 21 (L) 22 - 32 mmol/L   Glucose, Bld 152 (H) 65 - 99 mg/dL   BUN 13 6 - 20 mg/dL   Creatinine, Ser 1.10 0.61 - 1.24 mg/dL   Calcium 8.3 (L) 8.9 - 10.3 mg/dL   GFR calc non Af Amer >60 >60 mL/min   GFR calc Af Amer >60 >60 mL/min    Comment: (NOTE) The eGFR has been calculated using the CKD EPI equation. This calculation has not been validated in all clinical situations. eGFR's persistently <60 mL/min signify possible Chronic Kidney Disease.    Anion gap 7 5 - 15  Glucose, capillary     Status: Abnormal   Collection Time: 12/26/16  7:44 AM  Result Value Ref Range   Glucose-Capillary 122 (H) 65 - 99 mg/dL  Protime-INR     Status: Abnormal   Collection Time: 12/26/16  8:49 AM  Result Value Ref Range   Prothrombin Time 16.7 (H) 11.4 - 15.2 seconds   INR 1.34   Glucose, capillary     Status: Abnormal   Collection Time: 12/26/16 11:57 AM  Result Value Ref Range   Glucose-Capillary 116 (H) 65 - 99 mg/dL  Glucose, capillary      Status: Abnormal   Collection Time: 12/26/16  5:09 PM  Result Value Ref Range   Glucose-Capillary 116 (H) 65 - 99 mg/dL  Glucose, capillary     Status: Abnormal   Collection Time: 12/26/16  8:01 PM  Result Value Ref Range   Glucose-Capillary 159 (H) 65 - 99 mg/dL   Comment 1 Notify RN    Comment 2 Document in Chart   Glucose, capillary     Status: Abnormal   Collection Time: 12/26/16 11:52 PM  Result Value Ref Range   Glucose-Capillary 125 (H) 65 - 99 mg/dL   Comment 1 Notify RN    Comment 2 Document in Chart   Glucose, capillary     Status: Abnormal   Collection Time: 12/27/16  4:04 AM  Result Value Ref Range   Glucose-Capillary 136 (H) 65 - 99 mg/dL   Comment 1 Notify RN    Comment 2 Document in Chart   CBC     Status: Abnormal   Collection Time: 12/27/16  6:48 AM  Result Value Ref Range   WBC 7.3 4.0 - 10.5 K/uL   RBC 3.48 (L) 4.22 - 5.81 MIL/uL   Hemoglobin 7.4 (L) 13.0 - 17.0 g/dL    Comment: CONSISTENT WITH PREVIOUS RESULT   HCT 25.9 (L) 39.0 - 52.0 %   MCV 74.4 (L) 78.0 - 100.0 fL   MCH 21.3 (L) 26.0 - 34.0 pg   MCHC 28.6 (L) 30.0 - 36.0 g/dL   RDW 26.8 (H) 11.5 - 15.5 %   Platelets 442 (H) 150 - 400 K/uL  Basic metabolic panel     Status: Abnormal   Collection Time: 12/27/16  6:48 AM  Result Value Ref Range   Sodium 138 135 - 145 mmol/L   Potassium 3.7 3.5 - 5.1 mmol/L   Chloride 106 101 - 111 mmol/L   CO2 22 22 - 32 mmol/L   Glucose, Bld 110 (H) 65 - 99  mg/dL   BUN 8 6 - 20 mg/dL   Creatinine, Ser 0.98 0.61 - 1.24 mg/dL   Calcium 8.8 (L) 8.9 - 10.3 mg/dL   GFR calc non Af Amer >60 >60 mL/min   GFR calc Af Amer >60 >60 mL/min    Comment: (NOTE) The eGFR has been calculated using the CKD EPI equation. This calculation has not been validated in all clinical situations. eGFR's persistently <60 mL/min signify possible Chronic Kidney Disease.    Anion gap 10 5 - 15  Magnesium     Status: None   Collection Time: 12/27/16  6:48 AM  Result Value Ref Range    Magnesium 2.2 1.7 - 2.4 mg/dL  Glucose, capillary     Status: Abnormal   Collection Time: 12/27/16  7:55 AM  Result Value Ref Range   Glucose-Capillary 129 (H) 65 - 99 mg/dL  Glucose, capillary     Status: Abnormal   Collection Time: 12/27/16 11:36 AM  Result Value Ref Range   Glucose-Capillary 167 (H) 65 - 99 mg/dL   Ct Chest W Contrast  Result Date: 12/27/2016 CLINICAL DATA:  Recently diagnosed with colon cancer on endoscopy. Assess for metastatic disease. Initial encounter. EXAM: CT CHEST, ABDOMEN, AND PELVIS WITH CONTRAST TECHNIQUE: Multidetector CT imaging of the chest, abdomen and pelvis was performed following the standard protocol during bolus administration of intravenous contrast. CONTRAST:  150m ISOVUE-300 IOPAMIDOL (ISOVUE-300) INJECTION 61% COMPARISON:  CTA of the chest performed 01/17/2012, and report from CT of the abdomen and pelvis performed 12/23/2001 FINDINGS: CT CHEST FINDINGS Cardiovascular: Scattered coronary artery calcification is noted. The heart remains normal in size. Minimal mural thrombus is noted along the aortic arch, without evidence of luminal narrowing. The great vessels are grossly unremarkable in appearance. Mediastinum/Nodes: No mediastinal lymphadenopathy is seen. No pericardial effusion is identified. The thyroid gland is grossly unremarkable. No axial lymphadenopathy is appreciated. Lungs/Pleura: Mild right-sided peripheral scarring and trace right-sided pleural fluid is noted. The lungs are otherwise grossly clear. No pneumothorax is seen. No dominant mass is identified within the lungs. Musculoskeletal: No acute osseous abnormalities are identified. The visualized musculature is unremarkable in appearance. Mild skin thickening along the left lateral chest wall is of uncertain significance, without a focal mass. CT ABDOMEN PELVIS FINDINGS Hepatobiliary: Multiple large mildly heterogeneous masses are noted throughout the liver, measuring up to 7.5 cm in size.  These are compatible with metastatic disease. There is associated enlargement of the caudate lobe. The gallbladder contains small stones and likely sludge. It is otherwise unremarkable in appearance. The common bile duct remains normal in caliber. Pancreas: The pancreas is within normal limits. Spleen: The spleen is unremarkable in appearance. Adrenals/Urinary Tract: The adrenal glands are unremarkable in appearance. Small left renal cysts are seen. A nonobstructing 3 mm stone is noted at the interpole region of the right kidney. Nonspecific perinephric stranding is noted bilaterally. There is no evidence of hydronephrosis. Mild left-sided pelvicaliectasis remains within normal limits. No obstructing ureteral stones are identified. Stomach/Bowel: The appendix remains normal in caliber, without evidence of appendicitis. There is circumferential wall thickening along the mid transverse colon, measuring approximately 7 cm in length, compatible with primary colonic malignancy. This corresponds to the finding on colonoscopy. Associated prominent vasculature is noted. There is also less well defined diffuse wall thickening about the cecum and ileocecal junction. This may reflect underlying inflammation, though given its appearance, a second primary malignancy cannot be excluded. The remainder of the colon is grossly unremarkable. The small bowel is  unremarkable in appearance. A metallic density is noted at the right mid abdomen, likely reflecting remote injury. The stomach is grossly unremarkable in appearance. Vascular/Lymphatic: There is aneurysmal dilatation of the infrarenal abdominal aorta to 6.8 cm in transverse dimension and 6.5 cm in AP dimension, with associated mural thrombus. There appears to be chronic sequelae of hemorrhage into the wall of the aorta, with scattered associated irregular calcification in the visualized mural thrombus. This finding is stable on delayed images, without evidence of acute  hemorrhage at this time. Aneurysmal dilatation resolves at the level of the aortic bifurcation. There is focal aneurysmal dilatation at the distal right common iliac artery, measuring up to 2.3 cm in diameter. Scattered calcification is noted along the abdominal aorta and its branches. Reproductive: The bladder is moderately distended and grossly unremarkable. The prostate remains normal in size, with minimal calcification. Other: No additional soft tissue abnormalities are seen. Musculoskeletal: No acute osseous abnormalities are identified. The visualized musculature is unremarkable in appearance. IMPRESSION: 1. 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 There appears to be chronic sequelae of hemorrhage into the wall of the aorta, with scattered associated irregular calcification in the visualized mural thrombus. This finding is stable on delayed images, without evidence of acute hemorrhage at this time. Vascular surgery consultation recommended due to increased risk of rupture for AAA >5.5 cm. This recommendation follows ACR consensus guidelines: White Paper of the ACR Incidental Findings Committee II on Vascular Findings. J Am Coll Radiol 2013; 10:789-794. 2. 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 Scattered aortic atherosclerosis noted. 3. Circumferential wall thickening along the mid transverse colon, measuring 7 cm in length, compatible with primary colonic malignancy. This corresponds to the finding on colonoscopy. Associated prominent vasculature noted. 4. Less well defined diffuse wall thickening about the cecum and ileocecal junction. This may reflect underlying inflammation, though given its appearance, a second colonic primary malignancy cannot be excluded. 5. Diffuse metastatic disease throughout the liver, with heterogeneous masses  measuring up to 7.5 cm in size, and associated enlargement of the caudate lobe. 6. Mild focal skin thickening along the left lateral chest wall is of uncertain significance, without definite evidence of focal mass. Would correlate for any associated skin findings. 7. Scattered coronary artery calcification noted. 8. Mild right-sided peripheral scarring and trace right-sided pleural fluid noted. Lungs otherwise clear. 9. Small left renal cysts. Nonobstructing 3 mm stone at the interpole region of the right kidney. 10. Small stones and likely sludge within the gallbladder. Gallbladder otherwise unremarkable. These results were called by telephone at the time of interpretation on 12/27/2016 at 2:03 am to Chaney Malling NP, who verbally acknowledged these results. Electronically Signed   By: Garald Balding M.D.   On: 12/27/2016 02:15   Ct Abdomen Pelvis W Contrast  Result Date: 12/27/2016 CLINICAL DATA:  Recently diagnosed with colon cancer on endoscopy. Assess for metastatic disease. Initial encounter. EXAM: CT CHEST, ABDOMEN, AND PELVIS WITH CONTRAST TECHNIQUE: Multidetector CT imaging of the chest, abdomen and pelvis was performed following the standard protocol during bolus administration of intravenous contrast. CONTRAST:  133m ISOVUE-300 IOPAMIDOL (ISOVUE-300) INJECTION 61% COMPARISON:  CTA of the chest performed 01/17/2012, and report from CT of the abdomen and pelvis performed 12/23/2001 FINDINGS: CT CHEST FINDINGS Cardiovascular: Scattered coronary artery calcification is noted. The heart remains normal in size. Minimal mural thrombus is  noted along the aortic arch, without evidence of luminal narrowing. The great vessels are grossly unremarkable in appearance. Mediastinum/Nodes: No mediastinal lymphadenopathy is seen. No pericardial effusion is identified. The thyroid gland is grossly unremarkable. No axial lymphadenopathy is appreciated. Lungs/Pleura: Mild right-sided peripheral scarring and trace  right-sided pleural fluid is noted. The lungs are otherwise grossly clear. No pneumothorax is seen. No dominant mass is identified within the lungs. Musculoskeletal: No acute osseous abnormalities are identified. The visualized musculature is unremarkable in appearance. Mild skin thickening along the left lateral chest wall is of uncertain significance, without a focal mass. CT ABDOMEN PELVIS FINDINGS Hepatobiliary: Multiple large mildly heterogeneous masses are noted throughout the liver, measuring up to 7.5 cm in size. These are compatible with metastatic disease. There is associated enlargement of the caudate lobe. The gallbladder contains small stones and likely sludge. It is otherwise unremarkable in appearance. The common bile duct remains normal in caliber. Pancreas: The pancreas is within normal limits. Spleen: The spleen is unremarkable in appearance. Adrenals/Urinary Tract: The adrenal glands are unremarkable in appearance. Small left renal cysts are seen. A nonobstructing 3 mm stone is noted at the interpole region of the right kidney. Nonspecific perinephric stranding is noted bilaterally. There is no evidence of hydronephrosis. Mild left-sided pelvicaliectasis remains within normal limits. No obstructing ureteral stones are identified. Stomach/Bowel: The appendix remains normal in caliber, without evidence of appendicitis. There is circumferential wall thickening along the mid transverse colon, measuring approximately 7 cm in length, compatible with primary colonic malignancy. This corresponds to the finding on colonoscopy. Associated prominent vasculature is noted. There is also less well defined diffuse wall thickening about the cecum and ileocecal junction. This may reflect underlying inflammation, though given its appearance, a second primary malignancy cannot be excluded. The remainder of the colon is grossly unremarkable. The small bowel is unremarkable in appearance. A metallic density is noted  at the right mid abdomen, likely reflecting remote injury. The stomach is grossly unremarkable in appearance. Vascular/Lymphatic: There is aneurysmal dilatation of the infrarenal abdominal aorta to 6.8 cm in transverse dimension and 6.5 cm in AP dimension, with associated mural thrombus. There appears to be chronic sequelae of hemorrhage into the wall of the aorta, with scattered associated irregular calcification in the visualized mural thrombus. This finding is stable on delayed images, without evidence of acute hemorrhage at this time. Aneurysmal dilatation resolves at the level of the aortic bifurcation. There is focal aneurysmal dilatation at the distal right common iliac artery, measuring up to 2.3 cm in diameter. Scattered calcification is noted along the abdominal aorta and its branches. Reproductive: The bladder is moderately distended and grossly unremarkable. The prostate remains normal in size, with minimal calcification. Other: No additional soft tissue abnormalities are seen. Musculoskeletal: No acute osseous abnormalities are identified. The visualized musculature is unremarkable in appearance. IMPRESSION: 1. 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 There appears to be chronic sequelae of hemorrhage into the wall of the aorta, with scattered associated irregular calcification in the visualized mural thrombus. This finding is stable on delayed images, without evidence of acute hemorrhage at this time. Vascular surgery consultation recommended due to increased risk of rupture for AAA >5.5 cm. This recommendation follows ACR consensus guidelines: White Paper of the ACR Incidental Findings Committee II on Vascular Findings. J Am Coll Radiol 2013; 10:789-794. 2. Focal aneurysmal dilatation of the distal right common iliac artery, measuring  up to 2.3 cm in diameter. Scattered aortic atherosclerosis  noted. 3. Circumferential wall thickening along the mid transverse colon, measuring 7 cm in length, compatible with primary colonic malignancy. This corresponds to the finding on colonoscopy. Associated prominent vasculature noted. 4. Less well defined diffuse wall thickening about the cecum and ileocecal junction. This may reflect underlying inflammation, though given its appearance, a second colonic primary malignancy cannot be excluded. 5. Diffuse metastatic disease throughout the liver, with heterogeneous masses measuring up to 7.5 cm in size, and associated enlargement of the caudate lobe. 6. Mild focal skin thickening along the left lateral chest wall is of uncertain significance, without definite evidence of focal mass. Would correlate for any associated skin findings. 7. Scattered coronary artery calcification noted. 8. Mild right-sided peripheral scarring and trace right-sided pleural fluid noted. Lungs otherwise clear. 9. Small left renal cysts. Nonobstructing 3 mm stone at the interpole region of the right kidney. 10. Small stones and likely sludge within the gallbladder. Gallbladder otherwise unremarkable. These results were called by telephone at the time of interpretation on 12/27/2016 at 2:03 am to Chaney Malling NP, who verbally acknowledged these results. Electronically Signed   By: Garald Balding M.D.   On: 12/27/2016 02:15   Assessment/Plan Stage IV colon CA : Final pathology pending. Patient does not currently have any obstructive symptoms - has soft, daily bowel movements. Discussed that the mass would likely cause him obstructive symptoms in the future and offered a palliative diverting colostomy. Patient declined surgical intervention stating "I will not have a bag". Discussed the possibility of GI consultation for potential stent placement in the area of the obstruction to temporarily help keep the lumen of the colon open - patient did not seem open to this intervention either.    Recommend Oncology consult for discussion regarding chemotherapy options and goals of care.  General surgery will sign off but will be available should the patient re-consider surgical intervention.   Jill Alexanders, [facility] Surgery 12/27/2016, 12:18 PM Pager: [phone] Consults: [phone] Mon-Fri 7:00 am-4:30 pm Sat-Sun 7:00 am-11:30 am

## 2016-12-28 ENCOUNTER — Telehealth: Payer: Self-pay

## 2016-12-28 DIAGNOSIS — C801 Malignant (primary) neoplasm, unspecified: Secondary | ICD-10-CM

## 2016-12-28 DIAGNOSIS — I714 Abdominal aortic aneurysm, without rupture: Secondary | ICD-10-CM

## 2016-12-28 LAB — BASIC METABOLIC PANEL
Anion gap: 6 (ref 5–15)
BUN: 8 mg/dL (ref 6–20)
CALCIUM: 8.3 mg/dL — AB (ref 8.9–10.3)
CHLORIDE: 110 mmol/L (ref 101–111)
CO2: 21 mmol/L — ABNORMAL LOW (ref 22–32)
CREATININE: 0.92 mg/dL (ref 0.61–1.24)
GFR calc Af Amer: >60 mL/min (ref >60)
GFR calc non Af Amer: >60 mL/min (ref >60)
Glucose, Bld: 149 mg/dL — ABNORMAL HIGH (ref 65–99)
Potassium: 4 mmol/L (ref 3.5–5.1)
Sodium: 137 mmol/L (ref 135–145)

## 2016-12-28 LAB — GLUCOSE, CAPILLARY
GLUCOSE-CAPILLARY: 145 mg/dL — AB (ref 65–99)
GLUCOSE-CAPILLARY: 249 mg/dL — AB (ref 65–99)
Glucose-Capillary: 161 mg/dL — ABNORMAL HIGH (ref 65–99)

## 2016-12-28 LAB — CBC
HEMATOCRIT: 26.6 % — AB (ref 39.0–52.0)
HEMOGLOBIN: 7.8 g/dL — AB (ref 13.0–17.0)
MCH: 22.2 pg — AB (ref 26.0–34.0)
MCHC: 29.3 g/dL — AB (ref 30.0–36.0)
MCV: 75.6 fL — ABNORMAL LOW (ref 78.0–100.0)
Platelets: 369 10*3/uL (ref 150–400)
RBC: 3.52 MIL/uL — ABNORMAL LOW (ref 4.22–5.81)
RDW: 25.7 % — AB (ref 11.5–15.5)
WBC: 6.7 10*3/uL (ref 4.0–10.5)

## 2016-12-28 LAB — CEA: CEA: 3877 ng/mL — AB (ref 0.0–4.7)

## 2016-12-28 MED ORDER — BOOST / RESOURCE BREEZE PO LIQD: 1.0000 | Freq: Three times a day (TID) | ORAL | 0 refills | Status: DC

## 2016-12-28 NOTE — Progress Notes (Signed)
Norman Rodgers to be D/C'd to home with palliative care to follow but not hospice at this time, per MD order.  Discussed with the patient and all questions fully answered.  VSS, Skin clean, dry and intact without evidence of skin break down, no evidence of skin tears noted. IV catheter discontinued intact. Site without signs and symptoms of complications. Dressing and pressure applied.  An After Visit Summary was printed and given to the patient. Patient received prescription.  D/c education completed with patient/family including follow up instructions, medication list, d/c activities limitations if indicated, with other d/c instructions as indicated by MD - patient able to verbalize understanding, all questions fully answered.   Patient agreed to get out patient blood transfusions as needed. Patient instructed to return to ED, call 911, or call MD for any changes in condition.   Patient escorted via [place], and D/C home via private auto.  [name] Price 12/28/2016 2:41 PM

## 2016-12-28 NOTE — Telephone Encounter (Signed)
Called pt wife to let her know that pt is scheduled for 2 week follow up with Dr.Gudena on 01/07/17. Pt wife confirmed appt and has no further questions at this time. Call back number provided.

## 2016-12-28 NOTE — Progress Notes (Signed)
Subjective:  Patient denies any chest pain or shortness of breath. Eager to go home opted for comfort care only. States overall feels little stronger after blood transfusion.  Objective:  Vital Signs in the last 24 hours: Temp:  [97.4 F (36.3 C)-98.7 F (37.1 C)] 98.4 F (36.9 C) (01/26 0500) Pulse Rate:  [59-71] 61 (01/26 0500) Resp:  [18-20] 18 (01/26 0500) BP: (99-130)/(45-91) 130/91 (01/26 0500) SpO2:  [95 %-100 %] 100 % (01/26 0500)  Intake/Output from previous day: 01/25 0701 - 01/26 0700 In: 1092.5 [P.O.:360; Blood:732.5] Out: -  Intake/Output from this shift: Total I/O In: 720 [P.O.:720] Out: 1175 [Urine:1175]  Physical Exam: Exam unchanged  Lab Results:  Recent Labs  12/27/16 0648 12/28/16 0755  WBC 7.3 6.7  HGB 7.4* 7.8*  PLT 442* 369    Recent Labs  12/27/16 0648 12/28/16 0755  NA 138 137  K 3.7 4.0  CL 106 110  CO2 22 21*  GLUCOSE 110* 149*  BUN 8 8  CREATININE 0.98 0.92   No results for input(s): TROPONINI in the last 72 hours.  Invalid input(s): CK, MB Hepatic Function Panel No results for input(s): PROT, ALBUMIN, AST, ALT, ALKPHOS, BILITOT, BILIDIR, IBILI in the last 72 hours. No results for input(s): CHOL in the last 72 hours. No results for input(s): PROTIME in the last 72 hours.  Imaging: Imaging results have been reviewed and Ct Chest W Contrast  Result Date: 12/27/2016 CLINICAL DATA:  Recently diagnosed with colon cancer on endoscopy. Assess for metastatic disease. Initial encounter. EXAM: CT CHEST, ABDOMEN, AND PELVIS WITH CONTRAST TECHNIQUE: Multidetector CT imaging of the chest, abdomen and pelvis was performed following the standard protocol during bolus administration of intravenous contrast. CONTRAST:  156mL ISOVUE-300 IOPAMIDOL (ISOVUE-300) INJECTION 61% COMPARISON:  CTA of the chest performed 01/17/2012, and report from CT of the abdomen and pelvis performed 12/23/2001 FINDINGS: CT CHEST FINDINGS Cardiovascular: Scattered  coronary artery calcification is noted. The heart remains normal in size. Minimal mural thrombus is noted along the aortic arch, without evidence of luminal narrowing. The great vessels are grossly unremarkable in appearance. Mediastinum/Nodes: No mediastinal lymphadenopathy is seen. No pericardial effusion is identified. The thyroid gland is grossly unremarkable. No axial lymphadenopathy is appreciated. Lungs/Pleura: Mild right-sided peripheral scarring and trace right-sided pleural fluid is noted. The lungs are otherwise grossly clear. No pneumothorax is seen. No dominant mass is identified within the lungs. Musculoskeletal: No acute osseous abnormalities are identified. The visualized musculature is unremarkable in appearance. Mild skin thickening along the left lateral chest wall is of uncertain significance, without a focal mass. CT ABDOMEN PELVIS FINDINGS Hepatobiliary: Multiple large mildly heterogeneous masses are noted throughout the liver, measuring up to 7.5 cm in size. These are compatible with metastatic disease. There is associated enlargement of the caudate lobe. The gallbladder contains small stones and likely sludge. It is otherwise unremarkable in appearance. The common bile duct remains normal in caliber. Pancreas: The pancreas is within normal limits. Spleen: The spleen is unremarkable in appearance. Adrenals/Urinary Tract: The adrenal glands are unremarkable in appearance. Small left renal cysts are seen. A nonobstructing 3 mm stone is noted at the interpole region of the right kidney. Nonspecific perinephric stranding is noted bilaterally. There is no evidence of hydronephrosis. Mild left-sided pelvicaliectasis remains within normal limits. No obstructing ureteral stones are identified. Stomach/Bowel: The appendix remains normal in caliber, without evidence of appendicitis. There is circumferential wall thickening along the mid transverse colon, measuring approximately 7 cm in length,  compatible  with primary colonic malignancy. This corresponds to the finding on colonoscopy. Associated prominent vasculature is noted. There is also less well defined diffuse wall thickening about the cecum and ileocecal junction. This may reflect underlying inflammation, though given its appearance, a second primary malignancy cannot be excluded. The remainder of the colon is grossly unremarkable. The small bowel is unremarkable in appearance. A metallic density is noted at the right mid abdomen, likely reflecting remote injury. The stomach is grossly unremarkable in appearance. Vascular/Lymphatic: There is aneurysmal dilatation of the infrarenal abdominal aorta to 6.8 cm in transverse dimension and 6.5 cm in AP dimension, with associated mural thrombus. There appears to be chronic sequelae of hemorrhage into the wall of the aorta, with scattered associated irregular calcification in the visualized mural thrombus. This finding is stable on delayed images, without evidence of acute hemorrhage at this time. Aneurysmal dilatation resolves at the level of the aortic bifurcation. There is focal aneurysmal dilatation at the distal right common iliac artery, measuring up to 2.3 cm in diameter. Scattered calcification is noted along the abdominal aorta and its branches. Reproductive: The bladder is moderately distended and grossly unremarkable. The prostate remains normal in size, with minimal calcification. Other: No additional soft tissue abnormalities are seen. Musculoskeletal: No acute osseous abnormalities are identified. The visualized musculature is unremarkable in appearance. IMPRESSION: 1. 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 There appears to be chronic sequelae of hemorrhage into the wall of the aorta, with scattered associated irregular calcification in the visualized mural thrombus. This finding is  stable on delayed images, without evidence of acute hemorrhage at this time. Vascular surgery consultation recommended due to increased risk of rupture for AAA >5.5 cm. This recommendation follows ACR consensus guidelines: White Paper of the ACR Incidental Findings Committee II on Vascular Findings. J Am Coll Radiol 2013; 10:789-794. 2. 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 Scattered aortic atherosclerosis noted. 3. Circumferential wall thickening along the mid transverse colon, measuring 7 cm in length, compatible with primary colonic malignancy. This corresponds to the finding on colonoscopy. Associated prominent vasculature noted. 4. Less well defined diffuse wall thickening about the cecum and ileocecal junction. This may reflect underlying inflammation, though given its appearance, a second colonic primary malignancy cannot be excluded. 5. Diffuse metastatic disease throughout the liver, with heterogeneous masses measuring up to 7.5 cm in size, and associated enlargement of the caudate lobe. 6. Mild focal skin thickening along the left lateral chest wall is of uncertain significance, without definite evidence of focal mass. Would correlate for any associated skin findings. 7. Scattered coronary artery calcification noted. 8. Mild right-sided peripheral scarring and trace right-sided pleural fluid noted. Lungs otherwise clear. 9. Small left renal cysts. Nonobstructing 3 mm stone at the interpole region of the right kidney. 10. Small stones and likely sludge within the gallbladder. Gallbladder otherwise unremarkable. These results were called by telephone at the time of interpretation on 12/27/2016 at 2:03 am to Chaney Malling NP, who verbally acknowledged these results. Electronically Signed   By: Garald Balding M.D.   On: 12/27/2016 02:15   Ct Abdomen Pelvis W Contrast  Result Date: 12/27/2016 CLINICAL DATA:  Recently diagnosed with colon  cancer on endoscopy. Assess for metastatic disease. Initial encounter. EXAM: CT CHEST, ABDOMEN, AND PELVIS WITH CONTRAST TECHNIQUE: Multidetector CT imaging of the chest, abdomen and pelvis was  performed following the standard protocol during bolus administration of intravenous contrast. CONTRAST:  176mL ISOVUE-300 IOPAMIDOL (ISOVUE-300) INJECTION 61% COMPARISON:  CTA of the chest performed 01/17/2012, and report from CT of the abdomen and pelvis performed 12/23/2001 FINDINGS: CT CHEST FINDINGS Cardiovascular: Scattered coronary artery calcification is noted. The heart remains normal in size. Minimal mural thrombus is noted along the aortic arch, without evidence of luminal narrowing. The great vessels are grossly unremarkable in appearance. Mediastinum/Nodes: No mediastinal lymphadenopathy is seen. No pericardial effusion is identified. The thyroid gland is grossly unremarkable. No axial lymphadenopathy is appreciated. Lungs/Pleura: Mild right-sided peripheral scarring and trace right-sided pleural fluid is noted. The lungs are otherwise grossly clear. No pneumothorax is seen. No dominant mass is identified within the lungs. Musculoskeletal: No acute osseous abnormalities are identified. The visualized musculature is unremarkable in appearance. Mild skin thickening along the left lateral chest wall is of uncertain significance, without a focal mass. CT ABDOMEN PELVIS FINDINGS Hepatobiliary: Multiple large mildly heterogeneous masses are noted throughout the liver, measuring up to 7.5 cm in size. These are compatible with metastatic disease. There is associated enlargement of the caudate lobe. The gallbladder contains small stones and likely sludge. It is otherwise unremarkable in appearance. The common bile duct remains normal in caliber. Pancreas: The pancreas is within normal limits. Spleen: The spleen is unremarkable in appearance. Adrenals/Urinary Tract: The adrenal glands are unremarkable in appearance. Small  left renal cysts are seen. A nonobstructing 3 mm stone is noted at the interpole region of the right kidney. Nonspecific perinephric stranding is noted bilaterally. There is no evidence of hydronephrosis. Mild left-sided pelvicaliectasis remains within normal limits. No obstructing ureteral stones are identified. Stomach/Bowel: The appendix remains normal in caliber, without evidence of appendicitis. There is circumferential wall thickening along the mid transverse colon, measuring approximately 7 cm in length, compatible with primary colonic malignancy. This corresponds to the finding on colonoscopy. Associated prominent vasculature is noted. There is also less well defined diffuse wall thickening about the cecum and ileocecal junction. This may reflect underlying inflammation, though given its appearance, a second primary malignancy cannot be excluded. The remainder of the colon is grossly unremarkable. The small bowel is unremarkable in appearance. A metallic density is noted at the right mid abdomen, likely reflecting remote injury. The stomach is grossly unremarkable in appearance. Vascular/Lymphatic: There is aneurysmal dilatation of the infrarenal abdominal aorta to 6.8 cm in transverse dimension and 6.5 cm in AP dimension, with associated mural thrombus. There appears to be chronic sequelae of hemorrhage into the wall of the aorta, with scattered associated irregular calcification in the visualized mural thrombus. This finding is stable on delayed images, without evidence of acute hemorrhage at this time. Aneurysmal dilatation resolves at the level of the aortic bifurcation. There is focal aneurysmal dilatation at the distal right common iliac artery, measuring up to 2.3 cm in diameter. Scattered calcification is noted along the abdominal aorta and its branches. Reproductive: The bladder is moderately distended and grossly unremarkable. The prostate remains normal in size, with minimal calcification. Other:  No additional soft tissue abnormalities are seen. Musculoskeletal: No acute osseous abnormalities are identified. The visualized musculature is unremarkable in appearance. IMPRESSION: 1. 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 There appears to be chronic sequelae of hemorrhage into the wall of the aorta, with scattered associated irregular calcification in the visualized mural thrombus. This finding is stable on delayed  images, without evidence of acute hemorrhage at this time. Vascular surgery consultation recommended due to increased risk of rupture for AAA >5.5 cm. This recommendation follows ACR consensus guidelines: White Paper of the ACR Incidental Findings Committee II on Vascular Findings. J Am Coll Radiol 2013; 10:789-794. 2. 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 Scattered aortic atherosclerosis noted. 3. Circumferential wall thickening along the mid transverse colon, measuring 7 cm in length, compatible with primary colonic malignancy. This corresponds to the finding on colonoscopy. Associated prominent vasculature noted. 4. Less well defined diffuse wall thickening about the cecum and ileocecal junction. This may reflect underlying inflammation, though given its appearance, a second colonic primary malignancy cannot be excluded. 5. Diffuse metastatic disease throughout the liver, with heterogeneous masses measuring up to 7.5 cm in size, and associated enlargement of the caudate lobe. 6. Mild focal skin thickening along the left lateral chest wall is of uncertain significance, without definite evidence of focal mass. Would correlate for any associated skin findings. 7. Scattered coronary artery calcification noted. 8. Mild right-sided peripheral scarring and trace right-sided pleural fluid noted. Lungs otherwise clear. 9. Small left  renal cysts. Nonobstructing 3 mm stone at the interpole region of the right kidney. 10. Small stones and likely sludge within the gallbladder. Gallbladder otherwise unremarkable. These results were called by telephone at the time of interpretation on 12/27/2016 at 2:03 am to Chaney Malling NP, who verbally acknowledged these results. Electronically Signed   By: Garald Balding M.D.   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

## 2016-12-28 NOTE — Discharge Summary (Signed)
Discharge Summary  Norman Rodgers [id number] DOB: 03-24-1946  PCP: Patricia Nettle, MD  Admit date: 12/24/2016 Discharge date: 12/28/2016  Time spent: >25mins, more than 50% time spent on coordination of care  Recommendations for Outpatient Follow-up:  1. F/u with oncology  within 2 weeks  For blood transfusion in needed 2. Home palliative care set up, transition to hospice , likely in the near future  Discharge Diagnoses:  Active Hospital Problems   Diagnosis Date Noted  . Cancer ([place])   . Goals of care, counseling/discussion   . Palliative care by specialist   . Symptomatic anemia 12/24/2016  . Chronic systolic CHF (congestive heart failure) ([place]) 12/24/2016  . GI bleeding 12/24/2016  . Hypertension 12/24/2016  . Acute GI bleeding 12/24/2016  . CAD (coronary artery disease) 01/17/2012    Resolved Hospital Problems   Diagnosis Date Noted Date Resolved  No resolved problems to display.    Discharge Condition: stable  Diet recommendation: heart healthy/carb modified  There were no vitals filed for this visit.  History of present illness:  PCP: Patricia Nettle, MD  Patient coming from: Home.  Chief Complaint: Weakness and shortness of breath.  HPI: Norman Rodgers is a 71 y.o. male with history of CAD status post stenting, systolic CHF with last EF measured was 20% in 2013, diabetes mellitus type 2, hypertension presents to the ER because of increasing fatigue and weakness shortness of breath on exertion. Patient has been having these symptoms for last 1 month. Denies any chest pain or productive cough. 3 days ago patient had large amount of epistaxis. In the ER patient's hemoglobin was found to be about 4 and stool for occult blood was positive but not melanotic. Patient is being admitted for severe symptomatic anemia and GI bleed. Patient denies taking any NSAIDs other than aspirin.  Patient also has noticed increasing loss of weight.  ED Course: Hemoglobin was  4. Stool for occult blood was positive. 2 units of packed red blood cell transfusion has been ordered.  Hospital Course:  Active Problems:   CAD (coronary artery disease)   Symptomatic anemia   Chronic systolic CHF (congestive heart failure) ([facility])   GI bleeding   Hypertension   Acute GI bleeding   Cancer ([facility])   Goals of care, counseling/discussion   Palliative care by specialist   Acute GIB-/blood loss anemia Hemoglobin only 5 on admission- Total of 6units prbc transfused in the hospital,  colonoscopy on 1/24 with colon mass Gi input appreciated  Colon mass with live mets  oncology consulted, patient declined chemo or surgery, he is discharged home with home palliative care and outpatient follow up with oncology Dr Lindi Adie in two weeks for prbc transfusion if needed Likely will transition to hospice in the near future  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    Epistaxis-1 episode with no recurrence-patient not complaining of n/v  psvt-amio 200 daily. Cont coreg 12.5 bid--telemetry benign therefore discontinue monitors  Htn-ramipril 5 daily for htn  noninsulin dependent diabetes: continue home oral meds     DNR    Consultants:   GI  Cardiology Dr Terrence Dupont   oncology   Vascular surgery  General surgery  Palliative care  Procedures:   Colonoscopy on 1/24  Antimicrobials:  none     Discharge Exam: BP (!) 130/91 (BP Location: Right Arm)   Pulse 61   Temp 98.4 F (36.9 C) (Oral)   Resp 18   SpO2 100%   General: NAD Cardiovascular: RRR Respiratory: CTABL  Discharge Instructions You were cared for by a hospitalist during your hospital stay. If you have any questions about your discharge medications or  the care you received while you were in the hospital after you are discharged, you can call the unit and asked to speak with the hospitalist on call if the hospitalist that took care of you is not available. Once you are discharged, your primary care physician will handle any further medical issues. Please note that NO REFILLS for any discharge medications will be authorized once you are discharged, as it is imperative that you return to your primary care physician (or establish a relationship with a primary care physician if you do not have one) for your aftercare needs so that they can reassess your need for medications and monitor your lab values.  Discharge Instructions    Diet - low sodium heart healthy    Complete by:  As directed    Carb modified diet   Increase activity slowly    Complete by:  As directed      Allergies as of 12/28/2016   No Known Allergies     Medication List    STOP taking these medications   aspirin EC 81 MG tablet     TAKE these medications   amiodarone 200 MG tablet Commonly known as:  PACERONE Take 1 tablet (200 mg total) by mouth daily.   carvedilol 12.5 MG tablet Commonly known as:  COREG Take 1 tablet (12.5 mg total) by mouth 2 (two) times daily with a meal.   feeding supplement Liqd Take 1 Container by mouth 3 (three) times daily between meals.   glimepiride 4 MG tablet Commonly known as:  AMARYL Take 4 mg by mouth daily with breakfast.   nitroGLYCERIN 0.4 MG SL tablet Commonly known as:  NITROSTAT Place 1 tablet (0.4 mg total) under the tongue every 5 (five) minutes x 3 doses as needed for chest pain.   ramipril 2.5 MG capsule Commonly known as:  ALTACE Take 2 capsules (5 mg total) by mouth daily.   rosuvastatin 40 MG tablet Commonly known as:  CRESTOR Take 0.5 tablets (20 mg total) by mouth daily at 6 PM.      No Known Allergies Follow-up Information    [facility] Follow up.   Why:  [facility] will give you a call to  set up your home based palliative care. Contact information:       [phone]       Rulon Eisenmenger, MD Follow up in 1 week(s).   Specialty:  Hematology and Oncology Why:  for blood transfusion as needed Contact information: [place] [id number] [phone]            The results of significant diagnostics from this hospitalization (including imaging, microbiology, ancillary and laboratory) are listed below for reference.    Significant Diagnostic Studies: Ct Chest W Contrast  Result Date: 12/27/2016 CLINICAL DATA:  Recently diagnosed with colon cancer on endoscopy. Assess for metastatic disease. Initial encounter. EXAM: CT CHEST, ABDOMEN, AND PELVIS WITH CONTRAST TECHNIQUE: Multidetector CT imaging of the chest, abdomen and pelvis was performed following the standard protocol during bolus administration of intravenous contrast. CONTRAST:  190mL ISOVUE-300 IOPAMIDOL (ISOVUE-300) INJECTION  61% COMPARISON:  CTA of the chest performed 01/17/2012, and report from CT of the abdomen and pelvis performed 12/23/2001 FINDINGS: CT CHEST FINDINGS Cardiovascular: Scattered coronary artery calcification is noted. The heart remains normal in size. Minimal mural thrombus is noted along the aortic arch, without evidence of luminal narrowing. The great vessels are grossly unremarkable in appearance. Mediastinum/Nodes: No mediastinal lymphadenopathy is seen. No pericardial effusion is identified. The thyroid gland is grossly unremarkable. No axial lymphadenopathy is appreciated. Lungs/Pleura: Mild right-sided peripheral scarring and trace right-sided pleural fluid is noted. The lungs are otherwise grossly clear. No pneumothorax is seen. No dominant mass is identified within the lungs. Musculoskeletal: No acute osseous abnormalities are identified. The visualized musculature is unremarkable in appearance. Mild skin thickening along the left lateral chest wall is of uncertain  significance, without a focal mass. CT ABDOMEN PELVIS FINDINGS Hepatobiliary: Multiple large mildly heterogeneous masses are noted throughout the liver, measuring up to 7.5 cm in size. These are compatible with metastatic disease. There is associated enlargement of the caudate lobe. The gallbladder contains small stones and likely sludge. It is otherwise unremarkable in appearance. The common bile duct remains normal in caliber. Pancreas: The pancreas is within normal limits. Spleen: The spleen is unremarkable in appearance. Adrenals/Urinary Tract: The adrenal glands are unremarkable in appearance. Small left renal cysts are seen. A nonobstructing 3 mm stone is noted at the interpole region of the right kidney. Nonspecific perinephric stranding is noted bilaterally. There is no evidence of hydronephrosis. Mild left-sided pelvicaliectasis remains within normal limits. No obstructing ureteral stones are identified. Stomach/Bowel: The appendix remains normal in caliber, without evidence of appendicitis. There is circumferential wall thickening along the mid transverse colon, measuring approximately 7 cm in length, compatible with primary colonic malignancy. This corresponds to the finding on colonoscopy. Associated prominent vasculature is noted. There is also less well defined diffuse wall thickening about the cecum and ileocecal junction. This may reflect underlying inflammation, though given its appearance, a second primary malignancy cannot be excluded. The remainder of the colon is grossly unremarkable. The small bowel is unremarkable in appearance. A metallic density is noted at the right mid abdomen, likely reflecting remote injury. The stomach is grossly unremarkable in appearance. Vascular/Lymphatic: There is aneurysmal dilatation of the infrarenal abdominal aorta to 6.8 cm in transverse dimension and 6.5 cm in AP dimension, with associated mural thrombus. There appears to be chronic sequelae of hemorrhage  into the wall of the aorta, with scattered associated irregular calcification in the visualized mural thrombus. This finding is stable on delayed images, without evidence of acute hemorrhage at this time. Aneurysmal dilatation resolves at the level of the aortic bifurcation. There is focal aneurysmal dilatation at the distal right common iliac artery, measuring up to 2.3 cm in diameter. Scattered calcification is noted along the abdominal aorta and its branches. Reproductive: The bladder is moderately distended and grossly unremarkable. The prostate remains normal in size, with minimal calcification. Other: No additional soft tissue abnormalities are seen. Musculoskeletal: No acute osseous abnormalities are identified. The visualized musculature is unremarkable in appearance. IMPRESSION: 1. 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 There appears to be chronic sequelae of hemorrhage into the wall of the aorta, with scattered associated irregular calcification in the visualized mural thrombus. This finding is stable on delayed images, without evidence of acute hemorrhage at this time. Vascular surgery consultation recommended due to increased risk of  rupture for AAA >5.5 cm. This recommendation follows ACR consensus guidelines: White Paper of the ACR Incidental Findings Committee II on Vascular Findings. J Am Coll Radiol 2013; 10:789-794. 2. 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 Scattered aortic atherosclerosis noted. 3. Circumferential wall thickening along the mid transverse colon, measuring 7 cm in length, compatible with primary colonic malignancy. This corresponds to the finding on colonoscopy. Associated prominent vasculature noted. 4. Less well defined diffuse wall thickening about the cecum and ileocecal junction. This may reflect  underlying inflammation, though given its appearance, a second colonic primary malignancy cannot be excluded. 5. Diffuse metastatic disease throughout the liver, with heterogeneous masses measuring up to 7.5 cm in size, and associated enlargement of the caudate lobe. 6. Mild focal skin thickening along the left lateral chest wall is of uncertain significance, without definite evidence of focal mass. Would correlate for any associated skin findings. 7. Scattered coronary artery calcification noted. 8. Mild right-sided peripheral scarring and trace right-sided pleural fluid noted. Lungs otherwise clear. 9. Small left renal cysts. Nonobstructing 3 mm stone at the interpole region of the right kidney. 10. Small stones and likely sludge within the gallbladder. Gallbladder otherwise unremarkable. These results were called by telephone at the time of interpretation on 12/27/2016 at 2:03 am to Chaney Malling NP, who verbally acknowledged these results. Electronically Signed   By: Garald Balding M.D.   On: 12/27/2016 02:15   Ct Abdomen Pelvis W Contrast  Result Date: 12/27/2016 CLINICAL DATA:  Recently diagnosed with colon cancer on endoscopy. Assess for metastatic disease. Initial encounter. EXAM: CT CHEST, ABDOMEN, AND PELVIS WITH CONTRAST TECHNIQUE: Multidetector CT imaging of the chest, abdomen and pelvis was performed following the standard protocol during bolus administration of intravenous contrast. CONTRAST:  164mL ISOVUE-300 IOPAMIDOL (ISOVUE-300) INJECTION 61% COMPARISON:  CTA of the chest performed 01/17/2012, and report from CT of the abdomen and pelvis performed 12/23/2001 FINDINGS: CT CHEST FINDINGS Cardiovascular: Scattered coronary artery calcification is noted. The heart remains normal in size. Minimal mural thrombus is noted along the aortic arch, without evidence of luminal narrowing. The great vessels are grossly unremarkable in appearance. Mediastinum/Nodes: No mediastinal lymphadenopathy is seen.  No pericardial effusion is identified. The thyroid gland is grossly unremarkable. No axial lymphadenopathy is appreciated. Lungs/Pleura: Mild right-sided peripheral scarring and trace right-sided pleural fluid is noted. The lungs are otherwise grossly clear. No pneumothorax is seen. No dominant mass is identified within the lungs. Musculoskeletal: No acute osseous abnormalities are identified. The visualized musculature is unremarkable in appearance. Mild skin thickening along the left lateral chest wall is of uncertain significance, without a focal mass. CT ABDOMEN PELVIS FINDINGS Hepatobiliary: Multiple large mildly heterogeneous masses are noted throughout the liver, measuring up to 7.5 cm in size. These are compatible with metastatic disease. There is associated enlargement of the caudate lobe. The gallbladder contains small stones and likely sludge. It is otherwise unremarkable in appearance. The common bile duct remains normal in caliber. Pancreas: The pancreas is within normal limits. Spleen: The spleen is unremarkable in appearance. Adrenals/Urinary Tract: The adrenal glands are unremarkable in appearance. Small left renal cysts are seen. A nonobstructing 3 mm stone is noted at the interpole region of the right kidney. Nonspecific perinephric stranding is noted bilaterally. There is no evidence of hydronephrosis. Mild left-sided pelvicaliectasis remains within normal limits. No obstructing ureteral stones are identified. Stomach/Bowel: The appendix remains normal in caliber, without evidence of appendicitis. There is circumferential  wall thickening along the mid transverse colon, measuring approximately 7 cm in length, compatible with primary colonic malignancy. This corresponds to the finding on colonoscopy. Associated prominent vasculature is noted. There is also less well defined diffuse wall thickening about the cecum and ileocecal junction. This may reflect underlying inflammation, though given its  appearance, a second primary malignancy cannot be excluded. The remainder of the colon is grossly unremarkable. The small bowel is unremarkable in appearance. A metallic density is noted at the right mid abdomen, likely reflecting remote injury. The stomach is grossly unremarkable in appearance. Vascular/Lymphatic: There is aneurysmal dilatation of the infrarenal abdominal aorta to 6.8 cm in transverse dimension and 6.5 cm in AP dimension, with associated mural thrombus. There appears to be chronic sequelae of hemorrhage into the wall of the aorta, with scattered associated irregular calcification in the visualized mural thrombus. This finding is stable on delayed images, without evidence of acute hemorrhage at this time. Aneurysmal dilatation resolves at the level of the aortic bifurcation. There is focal aneurysmal dilatation at the distal right common iliac artery, measuring up to 2.3 cm in diameter. Scattered calcification is noted along the abdominal aorta and its branches. Reproductive: The bladder is moderately distended and grossly unremarkable. The prostate remains normal in size, with minimal calcification. Other: No additional soft tissue abnormalities are seen. Musculoskeletal: No acute osseous abnormalities are identified. The visualized musculature is unremarkable in appearance. IMPRESSION: 1. 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 There appears to be chronic sequelae of hemorrhage into the wall of the aorta, with scattered associated irregular calcification in the visualized mural thrombus. This finding is stable on delayed images, without evidence of acute hemorrhage at this time. Vascular surgery consultation recommended due to increased risk of rupture for AAA >5.5 cm. This recommendation follows ACR consensus guidelines: White Paper of the ACR Incidental Findings Committee II on Vascular  Findings. J Am Coll Radiol 2013; 10:789-794. 2. 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 Scattered aortic atherosclerosis noted. 3. Circumferential wall thickening along the mid transverse colon, measuring 7 cm in length, compatible with primary colonic malignancy. This corresponds to the finding on colonoscopy. Associated prominent vasculature noted. 4. Less well defined diffuse wall thickening about the cecum and ileocecal junction. This may reflect underlying inflammation, though given its appearance, a second colonic primary malignancy cannot be excluded. 5. Diffuse metastatic disease throughout the liver, with heterogeneous masses measuring up to 7.5 cm in size, and associated enlargement of the caudate lobe. 6. Mild focal skin thickening along the left lateral chest wall is of uncertain significance, without definite evidence of focal mass. Would correlate for any associated skin findings. 7. Scattered coronary artery calcification noted. 8. Mild right-sided peripheral scarring and trace right-sided pleural fluid noted. Lungs otherwise clear. 9. Small left renal cysts. Nonobstructing 3 mm stone at the interpole region of the right kidney. 10. Small stones and likely sludge within the gallbladder. Gallbladder otherwise unremarkable. These results were called by telephone at the time of interpretation on 12/27/2016 at 2:03 am to Chaney Malling NP, who verbally acknowledged these results. Electronically Signed   By: Garald Balding M.D.   On: 12/27/2016 02:15    Microbiology: No results found for this or any previous visit (from the past 240 hour(s)).   Labs: Basic Metabolic Panel:  Recent Labs Lab 12/24/16 1009 12/25/16 0934  12/26/16 0625 12/27/16 0648 12/28/16 0755  NA 131* 136 133* 138 137  K 3.6 3.6 3.4* 3.7 4.0  CL 101 107 105 106 110  CO2 22 24 21* 22 21*  GLUCOSE 99 89 152* 110* 149*  BUN 25* 13 13 8 8   CREATININE 1.46*  1.19 1.10 0.98 0.92  CALCIUM 8.1* 8.3* 8.3* 8.8* 8.3*  MG  --   --   --  2.2  --    Liver Function Tests: No results for input(s): AST, ALT, ALKPHOS, BILITOT, PROT, ALBUMIN in the last 168 hours. No results for input(s): LIPASE, AMYLASE in the last 168 hours. No results for input(s): AMMONIA in the last 168 hours. CBC:  Recent Labs Lab 12/24/16 1229 12/25/16 0934 12/25/16 1930 12/26/16 0625 12/27/16 0648 12/28/16 0755  WBC 9.1 10.8*  --  9.3 7.3 6.7  NEUTROABS  --  8.1*  --  7.1  --   --   HGB 5.3* 5.7* 6.9* 6.7* 7.4* 7.8*  HCT 18.7* 19.6* 23.2* 23.1* 25.9* 26.6*  MCV 66.5* 69.0*  --  72.2* 74.4* 75.6*  PLT 331 349  --  361 442* 369   Cardiac Enzymes: No results for input(s): CKTOTAL, CKMB, CKMBINDEX, TROPONINI in the last 168 hours. BNP: BNP (last 3 results) No results for input(s): BNP in the last 8760 hours.  ProBNP (last 3 results) No results for input(s): PROBNP in the last 8760 hours.  CBG:  Recent Labs Lab 12/27/16 1739 12/27/16 1954 12/28/16 0000 12/28/16 0403 12/28/16 0811  GLUCAP 183* 169* 195* 161* 145*       Signed:  Mindie Rawdon MD, PhD  [facility] 12/28/2016, 10:55 AM

## 2016-12-28 NOTE — Care Management Important Message (Signed)
Important Message  Patient Details  Name: Norman Rodgers MRN: [id number] Date of Birth: Mar 05, 1946   Medicare Important Message Given:  Yes    Nathen May 12/28/2016, 2:11 PM

## 2016-12-29 LAB — TYPE AND SCREEN
BLOOD PRODUCT EXPIRATION DATE: 201802012359
Blood Product Expiration Date: 201802092359
ISSUE DATE / TIME: 201801252130
ISSUE DATE / TIME: 201801260046
UNIT TYPE AND RH: 6200
Unit Type and Rh: 6200

## 2017-01-03 ENCOUNTER — Ambulatory Visit (HOSPITAL_COMMUNITY)
Admission: RE | Admit: 2017-01-03 | Discharge: 2017-01-03 | Disposition: A | Discharge status: Home / Self Care | Payer: Medicare Other | Source: Ambulatory Visit | Attending: Hematology and Oncology | Admitting: Hematology and Oncology

## 2017-01-04 ENCOUNTER — Other Ambulatory Visit: Payer: Self-pay | Admitting: *Deleted

## 2017-01-04 DIAGNOSIS — D649 Anemia, unspecified: Secondary | ICD-10-CM

## 2017-01-07 ENCOUNTER — Ambulatory Visit (HOSPITAL_BASED_OUTPATIENT_CLINIC_OR_DEPARTMENT_OTHER): Payer: Medicare Other | Admitting: Hematology and Oncology

## 2017-01-07 ENCOUNTER — Encounter: Payer: Self-pay | Admitting: Hematology and Oncology

## 2017-01-07 ENCOUNTER — Other Ambulatory Visit (HOSPITAL_BASED_OUTPATIENT_CLINIC_OR_DEPARTMENT_OTHER): Payer: Medicare Other

## 2017-01-07 ENCOUNTER — Telehealth: Payer: Self-pay | Admitting: Emergency Medicine

## 2017-01-07 DIAGNOSIS — D649 Anemia, unspecified: Secondary | ICD-10-CM

## 2017-01-07 DIAGNOSIS — C182 Malignant neoplasm of ascending colon: Secondary | ICD-10-CM

## 2017-01-07 DIAGNOSIS — C189 Malignant neoplasm of colon, unspecified: Secondary | ICD-10-CM

## 2017-01-07 DIAGNOSIS — C787 Secondary malignant neoplasm of liver and intrahepatic bile duct: Secondary | ICD-10-CM | POA: Diagnosis not present

## 2017-01-07 LAB — CBC WITH DIFFERENTIAL/PLATELET
BASO%: 0.5 % (ref 0.0–2.0)
Basophils Absolute: 0 10*3/uL (ref 0.0–0.1)
EOS ABS: 0.1 10*3/uL (ref 0.0–0.5)
EOS%: 1.3 % (ref 0.0–7.0)
HEMATOCRIT: 29.6 % — AB (ref 38.4–49.9)
HEMOGLOBIN: 8.5 g/dL — AB (ref 13.0–17.1)
LYMPH#: 1.1 10*3/uL (ref 0.9–3.3)
LYMPH%: 13.4 % — AB (ref 14.0–49.0)
MCH: 22.7 pg — ABNORMAL LOW (ref 27.2–33.4)
MCHC: 28.7 g/dL — ABNORMAL LOW (ref 32.0–36.0)
MCV: 79.1 fL — AB (ref 79.3–98.0)
MONO#: 0.8 10*3/uL (ref 0.1–0.9)
MONO%: 9.2 % (ref 0.0–14.0)
NEUT%: 75.6 % — ABNORMAL HIGH (ref 39.0–75.0)
NEUTROS ABS: 6.5 10*3/uL (ref 1.5–6.5)
Platelets: 370 10*3/uL (ref 140–400)
RBC: 3.74 10*6/uL — AB (ref 4.20–5.82)
RDW: 25.9 % — AB (ref 11.0–14.6)
WBC: 8.5 10*3/uL (ref 4.0–10.3)

## 2017-01-07 LAB — COMPREHENSIVE METABOLIC PANEL
ALBUMIN: 2.3 g/dL — AB (ref 3.5–5.0)
ALK PHOS: 115 U/L (ref 40–150)
ALT: 17 U/L (ref 0–55)
AST: 26 U/L (ref 5–34)
Anion Gap: 8 mEq/L (ref 3–11)
BILIRUBIN TOTAL: 0.57 mg/dL (ref 0.20–1.20)
BUN: 9.4 mg/dL (ref 7.0–26.0)
CALCIUM: 9.2 mg/dL (ref 8.4–10.4)
CO2: 23 mEq/L (ref 22–29)
CREATININE: 0.9 mg/dL (ref 0.7–1.3)
Chloride: 108 mEq/L (ref 98–109)
EGFR: >90 mL/min/{1.73_m2} (ref >90)
Glucose: 132 mg/dl (ref 70–140)
Potassium: 4 mEq/L (ref 3.5–5.1)
Sodium: 139 mEq/L (ref 136–145)
TOTAL PROTEIN: 7.8 g/dL (ref 6.4–8.3)

## 2017-01-07 MED ORDER — FERROUS SULFATE 325 (65 FE) MG PO TBEC: 325.0000 mg | DELAYED_RELEASE_TABLET | Freq: Every day | ORAL | 3 refills | Status: AC

## 2017-01-07 NOTE — Assessment & Plan Note (Signed)
Metastatic Colon cancer with liver mets:  Current plan: 1. Hospice care 2. blood transfusions for symptomatic anemia  Prognosis: Lengthy discussion regarding prognosis with and without chemotherapy. Patient's wishes are not to receive chemotherapy. Return to clinic as needed for transfusions

## 2017-01-07 NOTE — Progress Notes (Signed)
Patient Care Team: Wallene Huh, MD as PCP - General (Cardiology)  DIAGNOSIS:  Encounter Diagnosis  Name Primary?  . Metastatic colon cancer to liver ([place])     SUMMARY OF ONCOLOGIC HISTORY:   Metastatic colon cancer to liver ([place])   12/26/2016 Initial Diagnosis    Colon Biopsy mid to distal right ascending colon: Adenocarcinoma       CHIEF COMPLIANT: Follow-up of metastatic colon cancer  INTERVAL HISTORY: Norman Rodgers is a 71 year old was in the hospital recently with syncope from severe iron deficiency anemia related to GI bleeding. He was found to have 2 areas of: Abnormalities biopsy was adenocarcinoma the colon. CT scans reveal extensive liver metastases. He was offered systemic chemotherapy but he refused. He wants to be kept comfortable. He is accompanied by his wife today to discuss whether or not he needs blood transfusions. The only thing he would like to have is transfusions as needed. He reports that at home he has been doing well he's been walking around. He denied any dizziness or lightheadedness or shortness of breath exertion. Denies any chest pain or palpitations. He has a large bruise on the left arm.  REVIEW OF SYSTEMS:   Constitutional: Denies fevers, chills or abnormal weight loss Eyes: Denies blurriness of vision Ears, nose, mouth, throat, and face: Denies mucositis or sore throat Respiratory: Denies cough, dyspnea or wheezes Cardiovascular: Denies palpitation, chest discomfort Gastrointestinal:  Denies nausea, heartburn or change in bowel habits Skin: Denies abnormal skin rashes Lymphatics: Denies new lymphadenopathy or easy bruising Neurological:Denies numbness, tingling or new weaknesses Behavioral/Psych: Mood is stable, no new changes  Extremities: No lower extremity edema; large bruise in the left arm All other systems were reviewed with the patient and are negative.  I have reviewed the past medical history, past surgical history, social history  and family history with the patient and they are unchanged from previous note.  ALLERGIES:  has No Known Allergies.  MEDICATIONS:  Current Outpatient Prescriptions  Medication Sig Dispense Refill  . amiodarone (PACERONE) 200 MG tablet Take 1 tablet (200 mg total) by mouth daily. 30 tablet 3  . carvedilol (COREG) 12.5 MG tablet Take 1 tablet (12.5 mg total) by mouth 2 (two) times daily with a meal. 60 tablet 3  . feeding supplement (BOOST / RESOURCE BREEZE) LIQD Take 1 Container by mouth 3 (three) times daily between meals. 30 Container 0  . ferrous sulfate 325 (65 FE) MG EC tablet Take 1 tablet (325 mg total) by mouth daily with breakfast. 30 tablet 3  . glimepiride (AMARYL) 4 MG tablet Take 4 mg by mouth daily with breakfast.    . nitroGLYCERIN (NITROSTAT) 0.4 MG SL tablet Place 1 tablet (0.4 mg total) under the tongue every 5 (five) minutes x 3 doses as needed for chest pain. 25 tablet 3  . ramipril (ALTACE) 2.5 MG capsule Take 2 capsules (5 mg total) by mouth daily. 30 capsule 3  . rosuvastatin (CRESTOR) 40 MG tablet Take 0.5 tablets (20 mg total) by mouth daily at 6 PM. 30 tablet 3   No current facility-administered medications for this visit.     PHYSICAL EXAMINATION: ECOG PERFORMANCE STATUS: 1 - Symptomatic but completely ambulatory  Vitals:   01/07/17 0928  BP: 139/75  Pulse: 62  Resp: 18  Temp: 98.5 F (36.9 C)   Filed Weights   01/07/17 0928  Weight: 231 lb 4.8 oz (104.9 kg)    GENERAL:alert, no distress and comfortable SKIN: skin color, texture, turgor  are normal, no rashes or significant lesions EYES: normal, Conjunctiva are pink and non-injected, sclera clear OROPHARYNX:no exudate, no erythema and lips, buccal mucosa, and tongue normal  NECK: supple, thyroid normal size, non-tender, without nodularity LYMPH:  no palpable lymphadenopathy in the cervical, axillary or inguinal LUNGS: clear to auscultation and percussion with normal breathing effort HEART: regular  rate & rhythm and no murmurs and no lower extremity edema ABDOMEN:abdomen soft, non-tender and normal bowel sounds MUSCULOSKELETAL:no cyanosis of digits and no clubbing  NEURO: alert & oriented x 3 with fluent speech, no focal motor/sensory deficits EXTREMITIES: No lower extremity edema  LABORATORY DATA:  I have reviewed the data as listed   Chemistry      Component Value Date/Time   NA 139 01/07/2017 0905   K 4.0 01/07/2017 0905   CL 110 12/28/2016 0755   CO2 23 01/07/2017 0905   BUN 9.4 01/07/2017 0905   CREATININE 0.9 01/07/2017 0905      Component Value Date/Time   CALCIUM 9.2 01/07/2017 0905   ALKPHOS 115 01/07/2017 0905   AST 26 01/07/2017 0905   ALT 17 01/07/2017 0905   BILITOT 0.57 01/07/2017 0905       Lab Results  Component Value Date   WBC 8.5 01/07/2017   HGB 8.5 (L) 01/07/2017   HCT 29.6 (L) 01/07/2017   MCV 79.1 (L) 01/07/2017   PLT 370 01/07/2017   NEUTROABS 6.5 01/07/2017    ASSESSMENT & PLAN:  Metastatic colon cancer to liver [facility]) Metastatic Colon cancer with liver mets:  Current plan: 1. Hospice care 2. blood transfusions for symptomatic anemia  Prognosis: Lengthy discussion regarding prognosis with and without chemotherapy. Patient's wishes are not to receive chemotherapy. Return to clinic In 2 weeks for transfusions as needed. If in 2 weeks his hemoglobin is above 8, he will not need blood transfusion and we can see him less often.  I spent 25 minutes talking to the patient of which more than half was spent in counseling and coordination of care.  Orders Placed This Encounter  Procedures  . CBC with Differential    Standing Status:   Future    Standing Expiration Date:   01/07/2018  . Hold Tube, Blood Bank    Standing Status:   Future    Standing Expiration Date:   01/07/2018   The patient has a good understanding of the overall plan. he agrees with it. he will call with any problems that may develop before the next visit here.   Rulon Eisenmenger, MD 01/07/17

## 2017-01-07 NOTE — Telephone Encounter (Signed)
Referral made to [facility]; Dr Lindi Adie to be Attending.

## 2017-01-15 DIAGNOSIS — I2510 Atherosclerotic heart disease of native coronary artery without angina pectoris: Secondary | ICD-10-CM | POA: Diagnosis not present

## 2017-01-15 DIAGNOSIS — I10 Essential (primary) hypertension: Secondary | ICD-10-CM | POA: Diagnosis not present

## 2017-01-15 DIAGNOSIS — E119 Type 2 diabetes mellitus without complications: Secondary | ICD-10-CM | POA: Diagnosis not present

## 2017-01-15 DIAGNOSIS — I252 Old myocardial infarction: Secondary | ICD-10-CM | POA: Diagnosis not present

## 2017-01-15 DIAGNOSIS — C785 Secondary malignant neoplasm of large intestine and rectum: Secondary | ICD-10-CM | POA: Diagnosis not present

## 2017-01-17 ENCOUNTER — Telehealth: Payer: Self-pay | Admitting: Emergency Medicine

## 2017-01-17 NOTE — Telephone Encounter (Signed)
Call received from [facility] stating that patient is denying their services at this time; patient states he's not ready for hospice. Dr Lindi Adie aware and patient scheduled to see patient on 2/19 in the office.

## 2017-01-21 ENCOUNTER — Encounter: Payer: Self-pay | Admitting: Hematology and Oncology

## 2017-01-21 ENCOUNTER — Ambulatory Visit (HOSPITAL_BASED_OUTPATIENT_CLINIC_OR_DEPARTMENT_OTHER): Payer: Medicare Other | Admitting: Hematology and Oncology

## 2017-01-21 ENCOUNTER — Other Ambulatory Visit (HOSPITAL_BASED_OUTPATIENT_CLINIC_OR_DEPARTMENT_OTHER): Payer: Medicare Other

## 2017-01-21 DIAGNOSIS — C787 Secondary malignant neoplasm of liver and intrahepatic bile duct: Principal | ICD-10-CM

## 2017-01-21 DIAGNOSIS — C182 Malignant neoplasm of ascending colon: Secondary | ICD-10-CM

## 2017-01-21 DIAGNOSIS — C189 Malignant neoplasm of colon, unspecified: Secondary | ICD-10-CM

## 2017-01-21 LAB — CBC WITH DIFFERENTIAL/PLATELET
BASO%: 1 % (ref 0.0–2.0)
BASOS ABS: 0.1 10*3/uL (ref 0.0–0.1)
EOS ABS: 0.1 10*3/uL (ref 0.0–0.5)
EOS%: 1.3 % (ref 0.0–7.0)
HEMATOCRIT: 29.7 % — AB (ref 38.4–49.9)
HEMOGLOBIN: 9.4 g/dL — AB (ref 13.0–17.1)
LYMPH#: 1.8 10*3/uL (ref 0.9–3.3)
LYMPH%: 20.2 % (ref 14.0–49.0)
MCH: 24.7 pg — AB (ref 27.2–33.4)
MCHC: 31.5 g/dL — ABNORMAL LOW (ref 32.0–36.0)
MCV: 78.4 fL — AB (ref 79.3–98.0)
MONO#: 0.7 10*3/uL (ref 0.1–0.9)
MONO%: 7.7 % (ref 0.0–14.0)
NEUT#: 6.2 10*3/uL (ref 1.5–6.5)
NEUT%: 69.8 % (ref 39.0–75.0)
Platelets: 319 10*3/uL (ref 140–400)
RBC: 3.79 10*6/uL — ABNORMAL LOW (ref 4.20–5.82)
RDW: 28.9 % — AB (ref 11.0–14.6)
WBC: 8.9 10*3/uL (ref 4.0–10.3)

## 2017-01-21 NOTE — Assessment & Plan Note (Addendum)
Metastatic Colon cancer with liver mets:  Current plan: 1. Hospice care 2. blood transfusions for symptomatic anemia  Prognosis: Patient's wishes are not to receive chemotherapy. Return to clinic in 4 weeks for transfusions as needed.

## 2017-01-21 NOTE — Progress Notes (Signed)
Patient Care Team: Wallene Huh, MD as PCP - General (Cardiology)  DIAGNOSIS:  Encounter Diagnosis  Name Primary?  . Metastatic colon cancer to liver ([place])     SUMMARY OF ONCOLOGIC HISTORY:   Metastatic colon cancer to liver ([place])   12/26/2016 Initial Diagnosis    Colon Biopsy mid to distal right ascending colon: Adenocarcinoma       CHIEF COMPLIANT: Follow-up to review blood work from metastatic colon cancer  INTERVAL HISTORY: Norman Rodgers is a 71 year old with above-mentioned history metastatic colon cancer who is currently on hospice care and is here for a routine checkup to to see if he needs blood transfusion. He is in a wheelchair. He reports that overall he feels fairly well except for fatigue. He is planning to take his motorcycle out and go for the ride.  REVIEW OF SYSTEMS:   Constitutional: Denies fevers, chills or abnormal weight loss Eyes: Denies blurriness of vision Ears, nose, mouth, throat, and face: Denies mucositis or sore throat Respiratory: Denies cough, dyspnea or wheezes Cardiovascular: Denies palpitation, chest discomfort Gastrointestinal:  Denies nausea, heartburn or change in bowel habits Skin: Denies abnormal skin rashes Lymphatics: Denies new lymphadenopathy or easy bruising Neurological generalized fatigue and weakness Behavioral/Psych: Mood is stable, no new changes  Extremities: Generalized weakness  All other systems were reviewed with the patient and are negative.  I have reviewed the past medical history, past surgical history, social history and family history with the patient and they are unchanged from previous note.  ALLERGIES:  has No Known Allergies.  MEDICATIONS:  Current Outpatient Prescriptions  Medication Sig Dispense Refill  . amiodarone (PACERONE) 200 MG tablet Take 1 tablet (200 mg total) by mouth daily. 30 tablet 3  . carvedilol (COREG) 12.5 MG tablet Take 1 tablet (12.5 mg total) by mouth 2 (two) times daily with a  meal. 60 tablet 3  . feeding supplement (BOOST / RESOURCE BREEZE) LIQD Take 1 Container by mouth 3 (three) times daily between meals. 30 Container 0  . ferrous sulfate 325 (65 FE) MG EC tablet Take 1 tablet (325 mg total) by mouth daily with breakfast. 30 tablet 3  . glimepiride (AMARYL) 4 MG tablet Take 4 mg by mouth daily with breakfast.    . nitroGLYCERIN (NITROSTAT) 0.4 MG SL tablet Place 1 tablet (0.4 mg total) under the tongue every 5 (five) minutes x 3 doses as needed for chest pain. 25 tablet 3  . ramipril (ALTACE) 2.5 MG capsule Take 2 capsules (5 mg total) by mouth daily. 30 capsule 3  . rosuvastatin (CRESTOR) 40 MG tablet Take 0.5 tablets (20 mg total) by mouth daily at 6 PM. 30 tablet 3   No current facility-administered medications for this visit.     PHYSICAL EXAMINATION: ECOG PERFORMANCE STATUS: 1 - Symptomatic but completely ambulatory  Vitals:   01/21/17 1051  BP: 132/69  Pulse: 79  Resp: 20  Temp: 98.1 F (36.7 C)   Filed Weights   01/21/17 1051  Weight: 223 lb 9.6 oz (101.4 kg)    GENERAL:alert, no distress and comfortable SKIN: skin color, texture, turgor are normal, no rashes or significant lesions EYES: normal, Conjunctiva are pink and non-injected, sclera clear OROPHARYNX:no exudate, no erythema and lips, buccal mucosa, and tongue normal  NECK: supple, thyroid normal size, non-tender, without nodularity LYMPH:  no palpable lymphadenopathy in the cervical, axillary or inguinal LUNGS: clear to auscultation and percussion with normal breathing effort HEART: regular rate & rhythm and no murmurs and  no lower extremity edema ABDOMEN:abdomen soft, non-tender and normal bowel sounds MUSCULOSKELETAL:no cyanosis of digits and no clubbing  NEURO: alert & oriented x 3 with fluent speech, Lower extremity weakness EXTREMITIES: 1+ lower extremity edema   LABORATORY DATA:  I have reviewed the data as listed   Chemistry      Component Value Date/Time   NA 139  01/07/2017 0905   K 4.0 01/07/2017 0905   CL 110 12/28/2016 0755   CO2 23 01/07/2017 0905   BUN 9.4 01/07/2017 0905   CREATININE 0.9 01/07/2017 0905      Component Value Date/Time   CALCIUM 9.2 01/07/2017 0905   ALKPHOS 115 01/07/2017 0905   AST 26 01/07/2017 0905   ALT 17 01/07/2017 0905   BILITOT 0.57 01/07/2017 0905       Lab Results  Component Value Date   WBC 8.9 01/21/2017   HGB 9.4 (L) 01/21/2017   HCT 29.7 (L) 01/21/2017   MCV 78.4 (L) 01/21/2017   PLT 319 01/21/2017   NEUTROABS 6.2 01/21/2017    ASSESSMENT & PLAN:  Metastatic colon cancer to liver [facility]) Metastatic Colon cancer with liver mets:  Current plan: 1. Hospice care 2. blood transfusions for symptomatic anemia  Prognosis: Patient's wishes are not to receive chemotherapy. Return to clinic in 4 weeks for transfusions as needed.    I spent 25 minutes talking to the patient of which more than half was spent in counseling and coordination of care.  No orders of the defined types were placed in this encounter.  The patient has a good understanding of the overall plan. he agrees with it. he will call with any problems that may develop before the next visit here.   Rulon Eisenmenger, MD 01/21/17

## 2017-01-31 ENCOUNTER — Ambulatory Visit (HOSPITAL_COMMUNITY)
Admission: RE | Admit: 2017-01-31 | Discharge: 2017-01-31 | Disposition: A | Discharge status: Home / Self Care | Payer: Medicare Other | Source: Ambulatory Visit | Attending: Hematology and Oncology | Admitting: Hematology and Oncology

## 2017-02-18 ENCOUNTER — Other Ambulatory Visit (HOSPITAL_BASED_OUTPATIENT_CLINIC_OR_DEPARTMENT_OTHER): Payer: Medicare Other

## 2017-02-18 ENCOUNTER — Encounter: Payer: Self-pay | Admitting: Hematology and Oncology

## 2017-02-18 ENCOUNTER — Ambulatory Visit (HOSPITAL_BASED_OUTPATIENT_CLINIC_OR_DEPARTMENT_OTHER): Payer: Medicare Other | Admitting: Hematology and Oncology

## 2017-02-18 DIAGNOSIS — D649 Anemia, unspecified: Secondary | ICD-10-CM

## 2017-02-18 DIAGNOSIS — C182 Malignant neoplasm of ascending colon: Secondary | ICD-10-CM | POA: Diagnosis not present

## 2017-02-18 DIAGNOSIS — C787 Secondary malignant neoplasm of liver and intrahepatic bile duct: Secondary | ICD-10-CM

## 2017-02-18 DIAGNOSIS — C189 Malignant neoplasm of colon, unspecified: Secondary | ICD-10-CM

## 2017-02-18 LAB — CBC WITH DIFFERENTIAL/PLATELET
BASO%: 0.8 % (ref 0.0–2.0)
BASOS ABS: 0.1 10*3/uL (ref 0.0–0.1)
EOS ABS: 0.1 10*3/uL (ref 0.0–0.5)
EOS%: 1.1 % (ref 0.0–7.0)
HEMATOCRIT: 27.8 % — AB (ref 38.4–49.9)
HGB: 8.8 g/dL — ABNORMAL LOW (ref 13.0–17.1)
LYMPH#: 1.8 10*3/uL (ref 0.9–3.3)
LYMPH%: 21 % (ref 14.0–49.0)
MCH: 25.6 pg — ABNORMAL LOW (ref 27.2–33.4)
MCHC: 31.5 g/dL — ABNORMAL LOW (ref 32.0–36.0)
MCV: 81.3 fL (ref 79.3–98.0)
MONO#: 0.8 10*3/uL (ref 0.1–0.9)
MONO%: 9.6 % (ref 0.0–14.0)
NEUT#: 5.8 10*3/uL (ref 1.5–6.5)
NEUT%: 67.5 % (ref 39.0–75.0)
Platelets: 342 10*3/uL (ref 140–400)
RBC: 3.42 10*6/uL — AB (ref 4.20–5.82)
RDW: 23.9 % — AB (ref 11.0–14.6)
WBC: 8.6 10*3/uL (ref 4.0–10.3)

## 2017-02-18 LAB — COMPREHENSIVE METABOLIC PANEL
ALT: 17 U/L (ref 0–55)
ANION GAP: 9 meq/L (ref 3–11)
AST: 36 U/L — ABNORMAL HIGH (ref 5–34)
Albumin: 2.8 g/dL — ABNORMAL LOW (ref 3.5–5.0)
Alkaline Phosphatase: 113 U/L (ref 40–150)
BILIRUBIN TOTAL: 0.57 mg/dL (ref 0.20–1.20)
BUN: 12.8 mg/dL (ref 7.0–26.0)
CALCIUM: 9.4 mg/dL (ref 8.4–10.4)
CO2: 23 meq/L (ref 22–29)
CREATININE: 1 mg/dL (ref 0.7–1.3)
Chloride: 105 mEq/L (ref 98–109)
Glucose: 108 mg/dl (ref 70–140)
Potassium: 4.2 mEq/L (ref 3.5–5.1)
Sodium: 136 mEq/L (ref 136–145)
TOTAL PROTEIN: 8.2 g/dL (ref 6.4–8.3)

## 2017-02-18 LAB — CEA (IN HOUSE-CHCC)

## 2017-02-18 NOTE — Assessment & Plan Note (Signed)
Metastatic Colon cancer with liver mets:  Current plan: 1. Hospice care 2. blood transfusions for symptomatic anemia  Prognosis: Patient does not wish to receive chemotherapy. His prognosis is poor. Return to clinic as needed.

## 2017-02-18 NOTE — Progress Notes (Signed)
Patient Care Team: Wallene Huh, MD as PCP - General (Cardiology)  DIAGNOSIS:  Encounter Diagnosis  Name Primary?  . Metastatic colon cancer to liver ([place])     SUMMARY OF ONCOLOGIC HISTORY:   Metastatic colon cancer to liver ([place])   12/26/2016 Initial Diagnosis    Colon Biopsy mid to distal right ascending colon: Adenocarcinoma       CHIEF COMPLIANT: Patient is in hospice, follow-up to review blood work to see if he needs blood transfusion  INTERVAL HISTORY: Norman Rodgers is a 71 year old gentleman with metastatic colon cancer to the liver who is currently in hospice care and is here today to review his symptoms to see if he needs blood transfusion. He reports that he has not felt too badly. He denies any pain or discomfort. Denies any nausea vomiting. He is eating well. He needs a wheelchair for long distances but otherwise he can walk around by himself. He does not need assistance with ADLs.  REVIEW OF SYSTEMS:   Constitutional: Denies fevers, chills or abnormal weight loss Eyes: Denies blurriness of vision Ears, nose, mouth, throat, and face: Denies mucositis or sore throat Respiratory: Denies cough, dyspnea or wheezes Cardiovascular: Denies palpitation, chest discomfort Gastrointestinal:  Denies nausea, heartburn or change in bowel habits Skin: Denies abnormal skin rashes Lymphatics: Denies new lymphadenopathy or easy bruising Neurological: Generalized weakness Behavioral/Psych: Mood is stable, no new changes  Extremities: No lower extremity edema  All other systems were reviewed with the patient and are negative.  I have reviewed the past medical history, past surgical history, social history and family history with the patient and they are unchanged from previous note.  ALLERGIES:  has No Known Allergies.  MEDICATIONS:  Current Outpatient Prescriptions  Medication Sig Dispense Refill  . amiodarone (PACERONE) 200 MG tablet Take 1 tablet (200 mg total) by mouth  daily. 30 tablet 3  . carvedilol (COREG) 12.5 MG tablet Take 1 tablet (12.5 mg total) by mouth 2 (two) times daily with a meal. 60 tablet 3  . feeding supplement (BOOST / RESOURCE BREEZE) LIQD Take 1 Container by mouth 3 (three) times daily between meals. 30 Container 0  . ferrous sulfate 325 (65 FE) MG EC tablet Take 1 tablet (325 mg total) by mouth daily with breakfast. 30 tablet 3  . glimepiride (AMARYL) 4 MG tablet Take 4 mg by mouth daily with breakfast.    . nitroGLYCERIN (NITROSTAT) 0.4 MG SL tablet Place 1 tablet (0.4 mg total) under the tongue every 5 (five) minutes x 3 doses as needed for chest pain. 25 tablet 3  . ramipril (ALTACE) 2.5 MG capsule Take 2 capsules (5 mg total) by mouth daily. 30 capsule 3  . rosuvastatin (CRESTOR) 40 MG tablet Take 0.5 tablets (20 mg total) by mouth daily at 6 PM. 30 tablet 3   No current facility-administered medications for this visit.     PHYSICAL EXAMINATION: ECOG PERFORMANCE STATUS: 1 - Symptomatic but completely ambulatory  Vitals:   02/18/17 1157  BP: (!) 145/71  Pulse: 79  Resp: 18  Temp: 97.8 F (36.6 C)   Filed Weights   02/18/17 1157  Weight: 228 lb (103.4 kg)    GENERAL:alert, no distress and comfortable SKIN: skin color, texture, turgor are normal, no rashes or significant lesions EYES: normal, Conjunctiva are pink and non-injected, sclera clear OROPHARYNX:no exudate, no erythema and lips, buccal mucosa, and tongue normal  NECK: supple, thyroid normal size, non-tender, without nodularity LYMPH:  no palpable lymphadenopathy in  the cervical, axillary or inguinal LUNGS: clear to auscultation and percussion with normal breathing effort HEART: regular rate & rhythm and no murmurs and no lower extremity edema ABDOMEN:abdomen soft, non-tender and normal bowel sounds MUSCULOSKELETAL:no cyanosis of digits and no clubbing  NEURO: alert & oriented x 3 with fluent speech, no focal motor/sensory deficits EXTREMITIES: No lower extremity  edema  LABORATORY DATA:  I have reviewed the data as listed   Chemistry      Component Value Date/Time   NA 139 01/07/2017 0905   K 4.0 01/07/2017 0905   CL 110 12/28/2016 0755   CO2 23 01/07/2017 0905   BUN 9.4 01/07/2017 0905   CREATININE 0.9 01/07/2017 0905      Component Value Date/Time   CALCIUM 9.2 01/07/2017 0905   ALKPHOS 115 01/07/2017 0905   AST 26 01/07/2017 0905   ALT 17 01/07/2017 0905   BILITOT 0.57 01/07/2017 0905       Lab Results  Component Value Date   WBC 8.6 02/18/2017   HGB 8.8 (L) 02/18/2017   HCT 27.8 (L) 02/18/2017   MCV 81.3 02/18/2017   PLT 342 02/18/2017   NEUTROABS 5.8 02/18/2017    ASSESSMENT & PLAN:  Metastatic colon cancer to liver [facility]) Metastatic Colon cancer with liver mets:  Current plan: 1. Hospice care 2. blood transfusions for symptomatic anemia  His hemoglobin today is 8.8. He is not very symptomatic. I did not recommend blood transfusion. Prognosis: Patient does not wish to receive chemotherapy. His prognosis is poor. Return to clinic in 2 months with blood work and follow-up.  I spent 25 minutes talking to the patient of which more than half was spent in counseling and coordination of care.  No orders of the defined types were placed in this encounter.  The patient has a good understanding of the overall plan. he agrees with it. he will call with any problems that may develop before the next visit here.   Rulon Eisenmenger, MD 02/18/17

## 2017-03-21 ENCOUNTER — Encounter (HOSPITAL_COMMUNITY): Payer: Self-pay | Admitting: Emergency Medicine

## 2017-03-21 ENCOUNTER — Observation Stay (HOSPITAL_COMMUNITY)
Admission: EM | Admit: 2017-03-21 | Discharge: 2017-03-22 | Disposition: A | Discharge status: Home / Self Care | Payer: Medicare Other | Attending: Internal Medicine | Admitting: Internal Medicine

## 2017-03-21 ENCOUNTER — Emergency Department (HOSPITAL_COMMUNITY): Payer: Medicare Other

## 2017-03-21 DIAGNOSIS — D638 Anemia in other chronic diseases classified elsewhere: Secondary | ICD-10-CM | POA: Diagnosis not present

## 2017-03-21 DIAGNOSIS — Z7984 Long term (current) use of oral hypoglycemic drugs: Secondary | ICD-10-CM | POA: Diagnosis not present

## 2017-03-21 DIAGNOSIS — H04011 Acute dacryoadenitis, right lacrimal gland: Secondary | ICD-10-CM | POA: Diagnosis present

## 2017-03-21 DIAGNOSIS — H1089 Other conjunctivitis: Secondary | ICD-10-CM | POA: Insufficient documentation

## 2017-03-21 DIAGNOSIS — H05011 Cellulitis of right orbit: Secondary | ICD-10-CM | POA: Diagnosis not present

## 2017-03-21 DIAGNOSIS — C787 Secondary malignant neoplasm of liver and intrahepatic bile duct: Secondary | ICD-10-CM | POA: Diagnosis not present

## 2017-03-21 DIAGNOSIS — E119 Type 2 diabetes mellitus without complications: Secondary | ICD-10-CM | POA: Insufficient documentation

## 2017-03-21 DIAGNOSIS — I5022 Chronic systolic (congestive) heart failure: Secondary | ICD-10-CM | POA: Insufficient documentation

## 2017-03-21 DIAGNOSIS — I110 Hypertensive heart disease with heart failure: Secondary | ICD-10-CM | POA: Diagnosis not present

## 2017-03-21 DIAGNOSIS — C189 Malignant neoplasm of colon, unspecified: Secondary | ICD-10-CM | POA: Insufficient documentation

## 2017-03-21 DIAGNOSIS — K922 Gastrointestinal hemorrhage, unspecified: Secondary | ICD-10-CM | POA: Insufficient documentation

## 2017-03-21 DIAGNOSIS — Z515 Encounter for palliative care: Secondary | ICD-10-CM | POA: Insufficient documentation

## 2017-03-21 DIAGNOSIS — Z66 Do not resuscitate: Secondary | ICD-10-CM | POA: Insufficient documentation

## 2017-03-21 DIAGNOSIS — R220 Localized swelling, mass and lump, head: Secondary | ICD-10-CM | POA: Diagnosis not present

## 2017-03-21 DIAGNOSIS — D649 Anemia, unspecified: Secondary | ICD-10-CM | POA: Diagnosis present

## 2017-03-21 DIAGNOSIS — Z8546 Personal history of malignant neoplasm of prostate: Secondary | ICD-10-CM | POA: Insufficient documentation

## 2017-03-21 DIAGNOSIS — Z961 Presence of intraocular lens: Secondary | ICD-10-CM | POA: Diagnosis not present

## 2017-03-21 DIAGNOSIS — H04001 Unspecified dacryoadenitis, right lacrimal gland: Secondary | ICD-10-CM | POA: Diagnosis not present

## 2017-03-21 DIAGNOSIS — I2510 Atherosclerotic heart disease of native coronary artery without angina pectoris: Secondary | ICD-10-CM | POA: Insufficient documentation

## 2017-03-21 DIAGNOSIS — Z79899 Other long term (current) drug therapy: Secondary | ICD-10-CM | POA: Insufficient documentation

## 2017-03-21 DIAGNOSIS — H109 Unspecified conjunctivitis: Secondary | ICD-10-CM | POA: Diagnosis not present

## 2017-03-21 DIAGNOSIS — H5711 Ocular pain, right eye: Secondary | ICD-10-CM | POA: Diagnosis present

## 2017-03-21 HISTORY — DX: Malignant (primary) neoplasm, unspecified: C80.1

## 2017-03-21 LAB — COMPREHENSIVE METABOLIC PANEL
ALK PHOS: 110 U/L (ref 38–126)
ALT: 15 U/L — ABNORMAL LOW (ref 17–63)
ANION GAP: 9 (ref 5–15)
AST: 31 U/L (ref 15–41)
Albumin: 2.4 g/dL — ABNORMAL LOW (ref 3.5–5.0)
BUN: 7 mg/dL (ref 6–20)
CALCIUM: 8.6 mg/dL — AB (ref 8.9–10.3)
CO2: 24 mmol/L (ref 22–32)
Chloride: 103 mmol/L (ref 101–111)
Creatinine, Ser: 1.02 mg/dL (ref 0.61–1.24)
GFR calc non Af Amer: >60 mL/min (ref >60)
Glucose, Bld: 130 mg/dL — ABNORMAL HIGH (ref 65–99)
POTASSIUM: 3.7 mmol/L (ref 3.5–5.1)
SODIUM: 136 mmol/L (ref 135–145)
TOTAL PROTEIN: 7.3 g/dL (ref 6.5–8.1)
Total Bilirubin: 0.5 mg/dL (ref 0.3–1.2)

## 2017-03-21 LAB — CBC WITH DIFFERENTIAL/PLATELET
BASOS PCT: 0 %
Basophils Absolute: 0 10*3/uL (ref 0.0–0.1)
EOS ABS: 0.1 10*3/uL (ref 0.0–0.7)
EOS PCT: 1 %
HCT: 23.3 % — ABNORMAL LOW (ref 39.0–52.0)
Hemoglobin: 7.1 g/dL — ABNORMAL LOW (ref 13.0–17.0)
LYMPHS ABS: 2.1 10*3/uL (ref 0.7–4.0)
Lymphocytes Relative: 25 %
MCH: 25.2 pg — AB (ref 26.0–34.0)
MCHC: 30.5 g/dL (ref 30.0–36.0)
MCV: 82.6 fL (ref 78.0–100.0)
MONO ABS: 0.6 10*3/uL (ref 0.1–1.0)
MONOS PCT: 7 %
NEUTROS PCT: 67 %
Neutro Abs: 5.8 10*3/uL (ref 1.7–7.7)
PLATELETS: 375 10*3/uL (ref 150–400)
RBC: 2.82 MIL/uL — ABNORMAL LOW (ref 4.22–5.81)
RDW: 17.8 % — AB (ref 11.5–15.5)
WBC: 8.6 10*3/uL (ref 4.0–10.5)

## 2017-03-21 MED ORDER — VANCOMYCIN HCL IN DEXTROSE 1-5 GM/200ML-% IV SOLN
1000.0000 mg | Freq: Once | INTRAVENOUS | Status: AC
  Administered 2017-03-21: 1000 mg via INTRAVENOUS
  Filled 2017-03-21: qty 200

## 2017-03-21 MED ORDER — SODIUM CHLORIDE 0.9 % IV SOLN
Freq: Once | INTRAVENOUS | Status: AC
  Administered 2017-03-22: 01:00:00 via INTRAVENOUS

## 2017-03-21 MED ORDER — AMIODARONE HCL 200 MG PO TABS
200.0000 mg | ORAL_TABLET | Freq: Every day | ORAL | Status: DC
  Administered 2017-03-22: 200 mg via ORAL
  Filled 2017-03-21: qty 1

## 2017-03-21 MED ORDER — DEXTROSE 5 % IV SOLN
1.0000 g | INTRAVENOUS | Status: DC
  Filled 2017-03-21: qty 10

## 2017-03-21 MED ORDER — FERROUS SULFATE 325 (65 FE) MG PO TABS
325.0000 mg | ORAL_TABLET | Freq: Every day | ORAL | Status: DC
  Administered 2017-03-22: 325 mg via ORAL
  Filled 2017-03-21 (×2): qty 1

## 2017-03-21 MED ORDER — SODIUM CHLORIDE 0.9% FLUSH
3.0000 mL | Freq: Two times a day (BID) | INTRAVENOUS | Status: DC
  Administered 2017-03-22 (×2): 3 mL via INTRAVENOUS

## 2017-03-21 MED ORDER — IOPAMIDOL (ISOVUE-300) INJECTION 61%
INTRAVENOUS | Status: AC
  Administered 2017-03-21: 75 mL
  Filled 2017-03-21: qty 75

## 2017-03-21 MED ORDER — DEXTROSE 5 % IV SOLN
1.0000 g | Freq: Once | INTRAVENOUS | Status: AC
  Administered 2017-03-21: 1 g via INTRAVENOUS
  Filled 2017-03-21: qty 10

## 2017-03-21 MED ORDER — ROSUVASTATIN CALCIUM 20 MG PO TABS
20.0000 mg | ORAL_TABLET | Freq: Every day | ORAL | Status: DC
  Administered 2017-03-22: 20 mg via ORAL
  Filled 2017-03-21: qty 1

## 2017-03-21 MED ORDER — CARVEDILOL 12.5 MG PO TABS
12.5000 mg | ORAL_TABLET | Freq: Two times a day (BID) | ORAL | Status: DC
  Administered 2017-03-22 (×2): 12.5 mg via ORAL
  Filled 2017-03-21 (×2): qty 1

## 2017-03-21 MED ORDER — VANCOMYCIN HCL 10 G IV SOLR
1250.0000 mg | INTRAVENOUS | Status: DC
  Filled 2017-03-21: qty 1250

## 2017-03-21 MED ORDER — GLIMEPIRIDE 4 MG PO TABS
4.0000 mg | ORAL_TABLET | Freq: Every day | ORAL | Status: DC
  Filled 2017-03-21 (×2): qty 1

## 2017-03-21 MED ORDER — NITROGLYCERIN 0.4 MG SL SUBL: 0.4000 mg | SUBLINGUAL_TABLET | SUBLINGUAL | Status: DC | PRN

## 2017-03-21 MED ORDER — RAMIPRIL 2.5 MG PO CAPS
5.0000 mg | ORAL_CAPSULE | Freq: Every day | ORAL | Status: DC
  Administered 2017-03-22: 5 mg via ORAL
  Filled 2017-03-21: qty 2

## 2017-03-21 NOTE — ED Notes (Signed)
Dr. Gardner at bedside 

## 2017-03-21 NOTE — ED Notes (Signed)
Pt returned from CT and connected to BP Pulse ox

## 2017-03-21 NOTE — H&P (Addendum)
History and Physical    Norman Rodgers NOM:[id number] DOB: 1946-07-15 DOA: 03/21/2017  PCP: Patricia Nettle, MD  Patient coming from: Home  I have personally briefly reviewed patient's old medical records in [place]  Chief Complaint: Eye pain  HPI: Norman Rodgers is a 71 y.o. male with medical history significant of metastatic colon cancer to liver, currently on hospice for this.  Symptomatic anemia with last HGB 8.8 on 3/19 at oncology office.  Patient went to Dr. [name] office (opthalmology) for c/o pain, swelling, redness in his R eye.  Associated with discharge.  Symptoms onset about 1 day ago, persistent and constant symptoms, nothing tried for symptoms at this point.  Dr. [name] sent patient to ED with concern for Dacryoadenitis and recommended patient be put on rocephin and vancomycin and get a CT of orbits.   ED Course: HGB 7.1.  CT shows 10mm abscess and inflammation of lacrimal gland.  Infection appears confined to pre-septal area.  No findings of post-septal orbital cellulitis.  Patient started on rocephin and vancomycin.   Review of Systems: As per HPI otherwise 10 point review of systems negative.   Past Medical History:  Diagnosis Date  . Cancer ([place])   . Diabetes mellitus without complication ([place])   . Hypertension     Past Surgical History:  Procedure Laterality Date  . COLONOSCOPY N/A 12/26/2016   Procedure: COLONOSCOPY;  Surgeon: Juanita Craver, MD;  Location: [facility] ENDOSCOPY;  Service: Endoscopy;  Laterality: N/A;  . ESOPHAGOGASTRODUODENOSCOPY N/A 12/26/2016   Procedure: ESOPHAGOGASTRODUODENOSCOPY (EGD);  Surgeon: Juanita Craver, MD;  Location: [facility] ENDOSCOPY;  Service: Endoscopy;  Laterality: N/A;  . LEFT HEART CATHETERIZATION WITH CORONARY ANGIOGRAM N/A 01/22/2012   Procedure: LEFT HEART CATHETERIZATION WITH CORONARY ANGIOGRAM;  Surgeon: Clent Demark, MD;  Location: [place] CATH LAB;  Service: Cardiovascular;  Laterality: N/A;  . SUPRAVENTRICULAR TACHYCARDIA ABLATION  N/A 01/17/2012   Procedure: SUPRAVENTRICULAR TACHYCARDIA ABLATION;  Surgeon: Evans Lance, MD;  Location: [facility] CATH LAB;  Service: Cardiovascular;  Laterality: N/A;     reports that he has never smoked. He has never used smokeless tobacco. He reports that he drinks alcohol. He reports that he does not use drugs.  No Known Allergies  Family History  Problem Relation Age of Onset  . Hypertension Other      Prior to Admission medications   Medication Sig Start Date End Date Taking? Authorizing Provider  amiodarone (PACERONE) 200 MG tablet Take 1 tablet (200 mg total) by mouth daily. 01/26/12 12/24/16  Charolette Forward, MD  carvedilol (COREG) 12.5 MG tablet Take 1 tablet (12.5 mg total) by mouth 2 (two) times daily with a meal. 01/26/12 12/24/16  Charolette Forward, MD  feeding supplement (BOOST / RESOURCE BREEZE) LIQD Take 1 Container by mouth 3 (three) times daily between meals. 12/28/16   Florencia Reasons, MD  ferrous sulfate 325 (65 FE) MG EC tablet Take 1 tablet (325 mg total) by mouth daily with breakfast. 01/07/17   Nicholas Lose, MD  glimepiride (AMARYL) 4 MG tablet Take 4 mg by mouth daily with breakfast.    Historical Provider, MD  nitroGLYCERIN (NITROSTAT) 0.4 MG SL tablet Place 1 tablet (0.4 mg total) under the tongue every 5 (five) minutes x 3 doses as needed for chest pain. 01/26/12 12/24/16  Charolette Forward, MD  ramipril (ALTACE) 2.5 MG capsule Take 2 capsules (5 mg total) by mouth daily. 01/26/12 12/24/16  Charolette Forward, MD  rosuvastatin (CRESTOR) 40 MG tablet Take 0.5 tablets (20 mg  total) by mouth daily at 6 PM. 01/26/12 12/24/16  Charolette Forward, MD    Physical Exam: Vitals:   03/21/17 1911 03/21/17 1930 03/21/17 2000 03/21/17 2030  BP: 126/72 118/85 129/80 (!) 127/55  Pulse: 78 75 68 69  Resp: 16 16  16   Temp:      TempSrc:      SpO2: 100% 96% 100% 99%  Weight:      Height:        Constitutional: NAD, calm, comfortable Eyes: PERRL, lids and conjunctivae normal ENMT: Mucous membranes are moist.  Posterior pharynx clear of any exudate or lesions.Normal dentition.  Neck: normal, supple, no masses, no thyromegaly Respiratory: clear to auscultation bilaterally, no wheezing, no crackles. Normal respiratory effort. No accessory muscle use.  Cardiovascular: Regular rate and rhythm, no murmurs / rubs / gallops. No extremity edema. 2+ pedal pulses. No carotid bruits.  Abdomen: no tenderness, no masses palpated. No hepatosplenomegaly. Bowel sounds positive.  Musculoskeletal: no clubbing / cyanosis. No joint deformity upper and lower extremities. Good ROM, no contractures. Normal muscle tone.  Skin: no rashes, lesions, ulcers. No induration Neurologic: CN 2-12 grossly intact. Sensation intact, DTR normal. Strength 5/5 in all 4.  Psychiatric: Normal judgment and insight. Alert and oriented x 3. Normal mood.    Labs on Admission: I have personally reviewed following labs and imaging studies  CBC:  Recent Labs Lab 03/21/17 1907  WBC 8.6  NEUTROABS 5.8  HGB 7.1*  HCT 23.3*  MCV 82.6  PLT 202   Basic Metabolic Panel:  Recent Labs Lab 03/21/17 1907  NA 136  K 3.7  CL 103  CO2 24  GLUCOSE 130*  BUN 7  CREATININE 1.02  CALCIUM 8.6*   GFR: Estimated Creatinine Clearance: 56.6 mL/min (by C-G formula based on SCr of 1.02 mg/dL). Liver Function Tests:  Recent Labs Lab 03/21/17 1907  AST 31  ALT 15*  ALKPHOS 110  BILITOT 0.5  PROT 7.3  ALBUMIN 2.4*   No results for input(s): LIPASE, AMYLASE in the last 168 hours. No results for input(s): AMMONIA in the last 168 hours. Coagulation Profile: No results for input(s): INR, PROTIME in the last 168 hours. Cardiac Enzymes: No results for input(s): CKTOTAL, CKMB, CKMBINDEX, TROPONINI in the last 168 hours. BNP (last 3 results) No results for input(s): PROBNP in the last 8760 hours. HbA1C: No results for input(s): HGBA1C in the last 72 hours. CBG: No results for input(s): GLUCAP in the last 168 hours. Lipid Profile: No  results for input(s): CHOL, HDL, LDLCALC, TRIG, CHOLHDL, LDLDIRECT in the last 72 hours. Thyroid Function Tests: No results for input(s): TSH, T4TOTAL, FREET4, T3FREE, THYROIDAB in the last 72 hours. Anemia Panel: No results for input(s): VITAMINB12, FOLATE, FERRITIN, TIBC, IRON, RETICCTPCT in the last 72 hours. Urine analysis:    Component Value Date/Time   COLORURINE YELLOW 12/24/2016 1004   APPEARANCEUR CLEAR 12/24/2016 1004   LABSPEC 1.013 12/24/2016 1004   PHURINE 5.0 12/24/2016 1004   GLUCOSEU NEGATIVE 12/24/2016 1004   HGBUR NEGATIVE 12/24/2016 1004   BILIRUBINUR NEGATIVE 12/24/2016 1004   KETONESUR NEGATIVE 12/24/2016 1004   PROTEINUR NEGATIVE 12/24/2016 1004   NITRITE NEGATIVE 12/24/2016 1004   LEUKOCYTESUR NEGATIVE 12/24/2016 1004    Radiological Exams on Admission: Ct Orbits W Contrast  Result Date: 03/21/2017 CLINICAL DATA:  Swelling and redness of the right eye. Stage IV prostate cancer and colon cancer. EXAM: CT ORBITS WITH CONTRAST TECHNIQUE: Multidetector CT images was performed according to the standard protocol following  intravenous contrast administration. CONTRAST:  30mL ISOVUE-300 IOPAMIDOL (ISOVUE-300) INJECTION 61% COMPARISON:  None. FINDINGS: Orbits: Left orbit is normal. Right orbit shows swelling of the lacrimal gland with nonenhancing areas measuring up to 8 mm consistent with lacrimal gland inflammation and early abscess. This should all be preseptal inflammation, though the gland is swollen and and extends backwards because of the swelling. No evidence of intraconal abscess. Extra-ocular muscles are intrinsically normal. Optic nerve is normal. Orbital apex is normal. Visualized sinuses: Normal Soft tissues: Otherwise normal Limited intracranial: Normal IMPRESSION: Lacrimal gland inflammation on the right with early abscess formation measuring up to 8 mm. No evidence of postseptal or intraconal extension at this time. Electronically Signed   By: Nelson Chimes M.D.    On: 03/21/2017 21:32    EKG: Independently reviewed.  Assessment/Plan Principal Problem:   Dacryoadenitis, acute, right Active Problems:   Symptomatic anemia   Metastatic colon cancer to liver [facility])   Palliative care by specialist   Acute dacryoadenitis, right    1. Dacryoadenitis of R lacrimal gland - 1. No evidence of post-septal involvement 2. Will continue rocephin and vanc as recommended by Dr. [name] 2. Symptomatic anemia - secondary to ongoing GI blood loss and chronic disease due to known metastatic colon cancer 1. HGB 7.1 down from 8.8 a month ago 2. Will transfuse 2 units PRBC 3. Repeat CBC in AM 3. DM2 - continue amaryl  DVT prophylaxis: SCDs only due to ongoing GI blood loss Code Status: DNR - patient is pretty clear on this point Family Communication: Wife at bedside Disposition Plan: Home, possibly tomorrow Consults called: None, have instructions from Dr. [name], number is [phone] Admission status: Place in obs   [place], [place] [facility] Pager [phone]  If 7AM-7PM, please contact day team taking care of patient [email or web address] Password [email or web address]  03/21/2017, 11:12 PM

## 2017-03-21 NOTE — ED Notes (Signed)
Patient transported to CT 

## 2017-03-21 NOTE — ED Provider Notes (Signed)
[place] DEPT Provider Note   CSN: [id number] Arrival date & time: 03/21/17  1719     History   Chief Complaint Chief Complaint  Patient presents with  . Eye Pain    HPI Norman Rodgers is a 71 y.o. male.  Patient was seen by his ophthalmologist today Dr. [name].  Patient has swelling around his eye and discharge. He was sent over here for IV antibiotics and CT scan of his orbits    The history is provided by the patient. A language interpreter was used.  Eye Pain  This is a new problem. The current episode started 12 to 24 hours ago. The problem occurs constantly. The problem has not changed since onset.Pertinent negatives include no chest pain, no abdominal pain and no headaches. Nothing aggravates the symptoms. Nothing relieves the symptoms.    Past Medical History:  Diagnosis Date  . Cancer ([place])   . Diabetes mellitus without complication ([place])   . Hypertension     Patient Active Problem List   Diagnosis Date Noted  . Dacryoadenitis, acute, right 03/21/2017  . Acute dacryoadenitis, right 03/21/2017  . Metastatic colon cancer to liver ([place])   . Goals of care, counseling/discussion   . Palliative care by specialist   . Symptomatic anemia 12/24/2016  . Chronic systolic CHF (congestive heart failure) ([place]) 12/24/2016  . GI bleeding 12/24/2016  . Hypertension 12/24/2016  . Acute GI bleeding 12/24/2016  . CHF (congestive heart failure) ([place]) 01/17/2012  . Respiratory failure ([place]) 01/17/2012  . CAD (coronary artery disease) 01/17/2012  . Pneumonia 01/17/2012  . Alcohol abuse 01/17/2012  . SVT (supraventricular tachycardia) ([place]) 01/16/2012    Past Surgical History:  Procedure Laterality Date  . COLONOSCOPY N/A 12/26/2016   Procedure: COLONOSCOPY;  Surgeon: Juanita Craver, MD;  Location: [facility] ENDOSCOPY;  Service: Endoscopy;  Laterality: N/A;  . ESOPHAGOGASTRODUODENOSCOPY N/A 12/26/2016   Procedure: ESOPHAGOGASTRODUODENOSCOPY (EGD);  Surgeon: Juanita Craver, MD;   Location: [facility] ENDOSCOPY;  Service: Endoscopy;  Laterality: N/A;  . LEFT HEART CATHETERIZATION WITH CORONARY ANGIOGRAM N/A 01/22/2012   Procedure: LEFT HEART CATHETERIZATION WITH CORONARY ANGIOGRAM;  Surgeon: Clent Demark, MD;  Location: [place] CATH LAB;  Service: Cardiovascular;  Laterality: N/A;  . SUPRAVENTRICULAR TACHYCARDIA ABLATION N/A 01/17/2012   Procedure: SUPRAVENTRICULAR TACHYCARDIA ABLATION;  Surgeon: Evans Lance, MD;  Location: [facility] CATH LAB;  Service: Cardiovascular;  Laterality: N/A;       Home Medications    Prior to Admission medications   Medication Sig Start Date End Date Taking? Authorizing Provider  amiodarone (PACERONE) 200 MG tablet Take 1 tablet (200 mg total) by mouth daily. 01/26/12 12/24/16  Charolette Forward, MD  carvedilol (COREG) 12.5 MG tablet Take 1 tablet (12.5 mg total) by mouth 2 (two) times daily with a meal. 01/26/12 12/24/16  Charolette Forward, MD  feeding supplement (BOOST / RESOURCE BREEZE) LIQD Take 1 Container by mouth 3 (three) times daily between meals. 12/28/16   Florencia Reasons, MD  ferrous sulfate 325 (65 FE) MG EC tablet Take 1 tablet (325 mg total) by mouth daily with breakfast. 01/07/17   Nicholas Lose, MD  glimepiride (AMARYL) 4 MG tablet Take 4 mg by mouth daily with breakfast.    Historical Provider, MD  nitroGLYCERIN (NITROSTAT) 0.4 MG SL tablet Place 1 tablet (0.4 mg total) under the tongue every 5 (five) minutes x 3 doses as needed for chest pain. 01/26/12 12/24/16  Charolette Forward, MD  ramipril (ALTACE) 2.5 MG capsule Take 2 capsules (5 mg total) by  mouth daily. 01/26/12 12/24/16  Charolette Forward, MD  rosuvastatin (CRESTOR) 40 MG tablet Take 0.5 tablets (20 mg total) by mouth daily at 6 PM. 01/26/12 12/24/16  Charolette Forward, MD    Family History Family History  Problem Relation Age of Onset  . Hypertension Other     Social History Social History  Substance Use Topics  . Smoking status: Never Smoker  . Smokeless tobacco: Never Used  . Alcohol use Yes      Allergies   Patient has no known allergies.   Review of Systems Review of Systems  Constitutional: Negative for appetite change and fatigue.  HENT: Negative for congestion, ear discharge and sinus pressure.   Eyes: Positive for pain. Negative for discharge.  Respiratory: Negative for cough.   Cardiovascular: Negative for chest pain.  Gastrointestinal: Negative for abdominal pain and diarrhea.  Genitourinary: Negative for frequency and hematuria.  Musculoskeletal: Negative for back pain.  Skin: Negative for rash.  Neurological: Negative for seizures and headaches.  Psychiatric/Behavioral: Negative for hallucinations.     Physical Exam Updated Vital Signs BP (!) 127/55   Pulse 69   Temp 97.6 F (36.4 C) (Oral)   Resp 16   Ht 6' (1.829 m)   Wt 129 lb (58.5 kg)   SpO2 99%   BMI 17.50 kg/m   Physical Exam  Constitutional: He is oriented to person, place, and time. He appears well-developed.  HENT:  Head: Normocephalic.  Conjunctiva inflamed on the right eye with severe swelling around his eye and yellow discharge  Eyes: Conjunctivae and EOM are normal. No scleral icterus.  Neck: Neck supple. No thyromegaly present.  Cardiovascular: Normal rate and regular rhythm.  Exam reveals no gallop and no friction rub.   No murmur heard. Pulmonary/Chest: No stridor. He has no wheezes. He has no rales. He exhibits no tenderness.  Abdominal: He exhibits no distension. There is no tenderness. There is no rebound.  Musculoskeletal: Normal range of motion. He exhibits no edema.  Lymphadenopathy:    He has no cervical adenopathy.  Neurological: He is oriented to person, place, and time. He exhibits normal muscle tone. Coordination normal.  Skin: No rash noted. No erythema.  Psychiatric: He has a normal mood and affect. His behavior is normal.     ED Treatments / Results  Labs (all labs ordered are listed, but only abnormal results are displayed) Labs Reviewed  CBC WITH  DIFFERENTIAL/PLATELET - Abnormal; Notable for the following:       Result Value   RBC 2.82 (*)    Hemoglobin 7.1 (*)    HCT 23.3 (*)    MCH 25.2 (*)    RDW 17.8 (*)    All other components within normal limits  COMPREHENSIVE METABOLIC PANEL - Abnormal; Notable for the following:    Glucose, Bld 130 (*)    Calcium 8.6 (*)    Albumin 2.4 (*)    ALT 15 (*)    All other components within normal limits  CBC  PREPARE RBC (CROSSMATCH)  TYPE AND SCREEN    EKG  EKG Interpretation None       Radiology Ct Orbits W Contrast  Result Date: 03/21/2017 CLINICAL DATA:  Swelling and redness of the right eye. Stage IV prostate cancer and colon cancer. EXAM: CT ORBITS WITH CONTRAST TECHNIQUE: Multidetector CT images was performed according to the standard protocol following intravenous contrast administration. CONTRAST:  76mL ISOVUE-300 IOPAMIDOL (ISOVUE-300) INJECTION 61% COMPARISON:  None. FINDINGS: Orbits: Left orbit is normal. Right  orbit shows swelling of the lacrimal gland with nonenhancing areas measuring up to 8 mm consistent with lacrimal gland inflammation and early abscess. This should all be preseptal inflammation, though the gland is swollen and and extends backwards because of the swelling. No evidence of intraconal abscess. Extra-ocular muscles are intrinsically normal. Optic nerve is normal. Orbital apex is normal. Visualized sinuses: Normal Soft tissues: Otherwise normal Limited intracranial: Normal IMPRESSION: Lacrimal gland inflammation on the right with early abscess formation measuring up to 8 mm. No evidence of postseptal or intraconal extension at this time. Electronically Signed   By: Nelson Chimes M.D.   On: 03/21/2017 21:32    Procedures Procedures (including critical care time)  Medications Ordered in ED Medications  0.9 %  sodium chloride infusion (not administered)  cefTRIAXone (ROCEPHIN) 1 g in dextrose 5 % 50 mL IVPB (not administered)  sodium chloride flush (NS) 0.9  % injection 3 mL (not administered)  vancomycin (VANCOCIN) IVPB 1000 mg/200 mL premix (0 mg Intravenous Stopped 03/21/17 2129)  cefTRIAXone (ROCEPHIN) 1 g in dextrose 5 % 50 mL IVPB (0 g Intravenous Stopped 03/21/17 2021)  iopamidol (ISOVUE-300) 61 % injection (75 mLs  Contrast Given 03/21/17 2045)     Initial Impression / Assessment and Plan / ED Course  I have reviewed the triage vital signs and the nursing notes.  Pertinent labs & imaging results that were available during my care of the patient were reviewed by me and considered in my medical decision making (see chart for details).     CT shows lacrimal gland inflammation with early abscess formation. No evidence of post septal extension. The ophthalmologist wants the patient admitted for IV antibiotics and the ophthalmologist will see the patient in the morning  Final Clinical Impressions(s) / ED Diagnoses   Final diagnoses:  None    New Prescriptions New Prescriptions   No medications on file     Milton Ferguson, MD 03/21/17 2312

## 2017-03-21 NOTE — ED Triage Notes (Signed)
Pt sent here from Dr [name] to have eval for possible infection in right eye; swelling and redness noted; pt recently diagnosed with stage 4 prostate and colon CA

## 2017-03-21 NOTE — Progress Notes (Signed)
Pharmacy Antibiotic Note  Norman Rodgers is a 71 y.o. male admitted on 03/21/2017 with periorbital cellulitis.  Pharmacy has been consulted for Vancomycin dosing. Pt also on Rocephin.  Vanc 1gm given in ED ~1940  Plan: Vancomycin 1250mg  IV q24h Will f/u micro data, renal function, and pt's clinical condition Vanc trough prn   Height: 6' (182.9 cm) Weight: 129 lb (58.5 kg) IBW/kg (Calculated) : 77.6  Temp (24hrs), Avg:97.6 F (36.4 C), Min:97.6 F (36.4 C), Max:97.6 F (36.4 C)   Recent Labs Lab 03/21/17 1907  WBC 8.6  CREATININE 1.02    Estimated Creatinine Clearance: 56.6 mL/min (by C-G formula based on SCr of 1.02 mg/dL).    No Known Allergies  Antimicrobials this admission: 4/19 Vanc >>  4/19 Rocephin >>   Dose adjustments this admission: n/a   Thank you for allowing pharmacy to be a part of this patient's care.  Sherlon Handing, PharmD, BCPS Clinical pharmacist, pager [phone] 03/21/2017 11:20 PM

## 2017-03-22 ENCOUNTER — Encounter (HOSPITAL_COMMUNITY): Payer: Self-pay | Admitting: *Deleted

## 2017-03-22 DIAGNOSIS — H05011 Cellulitis of right orbit: Secondary | ICD-10-CM | POA: Diagnosis not present

## 2017-03-22 DIAGNOSIS — H04011 Acute dacryoadenitis, right lacrimal gland: Secondary | ICD-10-CM | POA: Diagnosis not present

## 2017-03-22 LAB — PREPARE RBC (CROSSMATCH)

## 2017-03-22 LAB — CBC
HEMATOCRIT: 21.7 % — AB (ref 39.0–52.0)
Hemoglobin: 6.4 g/dL — CL (ref 13.0–17.0)
MCH: 24.2 pg — AB (ref 26.0–34.0)
MCHC: 29.5 g/dL — AB (ref 30.0–36.0)
MCV: 82.2 fL (ref 78.0–100.0)
Platelets: 336 10*3/uL (ref 150–400)
RBC: 2.64 MIL/uL — AB (ref 4.22–5.81)
RDW: 17.7 % — AB (ref 11.5–15.5)
WBC: 9.4 10*3/uL (ref 4.0–10.5)

## 2017-03-22 MED ORDER — AMOXICILLIN-POT CLAVULANATE 875-125 MG PO TABS: 1.0000 | ORAL_TABLET | Freq: Two times a day (BID) | ORAL | 0 refills | Status: AC

## 2017-03-22 MED ORDER — BACITRACIN-POLYMYXIN B 500-10000 UNIT/GM OP OINT
1.0000 | TOPICAL_OINTMENT | Freq: Two times a day (BID) | OPHTHALMIC | 0 refills | Status: AC
Start: 2017-03-22 — End: ?

## 2017-03-22 MED ORDER — VANCOMYCIN HCL IN DEXTROSE 1-5 GM/200ML-% IV SOLN
1000.0000 mg | Freq: Two times a day (BID) | INTRAVENOUS | Status: DC
  Administered 2017-03-22: 1000 mg via INTRAVENOUS
  Filled 2017-03-22 (×2): qty 200

## 2017-03-22 MED ORDER — SODIUM CHLORIDE 0.9 % IV SOLN
Freq: Once | INTRAVENOUS | Status: AC
  Administered 2017-03-22: 13:00:00 via INTRAVENOUS

## 2017-03-22 MED ORDER — HYDROCODONE-ACETAMINOPHEN 5-325 MG PO TABS
1.0000 | ORAL_TABLET | Freq: Four times a day (QID) | ORAL | Status: DC | PRN
Start: 2017-03-22 — End: 2017-03-22

## 2017-03-22 NOTE — Progress Notes (Signed)
CRITICAL VALUE ALERT  Critical value received:HGB=6.4  Date of notification:  03/22/2017  Time of notification:  0651  Critical value read back:Yes.    Nurse who received alert:  Stephanie Coup RN  MD notified (1st page):  Dr.Joseph  Time of first page:  [phone]  MD notified (2nd page):  Time of second page:  Responding MD:  Dr.Joseph  Time MD JJHERDEYC:1448

## 2017-03-22 NOTE — Progress Notes (Signed)
Pharmacy Antibiotic Note  Norman Rodgers is a 71 y.o. male admitted on 03/21/2017 with periorbital cellulitis.  Pharmacy has been consulted for Vancomycin dosing. Pt also on Rocephin.  The patient's weight has been updated in the system from 58.5 kg >> 101.5 kg. The patient confirmed a weight of 220 lb. Will adjust the Vancomycin dosing accordingly.   Plan: 1. Increase Vancomycin to 1g IV every 12 hours 2. Will continue to follow renal function, culture results, LOT, and antibiotic de-escalation plans    Height: 6' (182.9 cm) Weight: 223 lb 12.8 oz (101.5 kg) IBW/kg (Calculated) : 77.6  Temp (24hrs), Avg:99.2 F (37.3 C), Min:97.6 F (36.4 C), Max:100.4 F (38 C)   Recent Labs Lab 03/21/17 1907 03/22/17 0451  WBC 8.6 9.4  CREATININE 1.02  --     Estimated Creatinine Clearance: 84.3 mL/min (by C-G formula based on SCr of 1.02 mg/dL).    No Known Allergies  Antimicrobials this admission: 4/19 Vanc >>  4/19 Rocephin >>   Dose adjustments this admission: n/a   Thank you for allowing pharmacy to be a part of this patient's care.  Alycia Rossetti, PharmD, BCPS Clinical Pharmacist Pager: [phone] Clinical phone for 03/22/2017 from 7a-3:30p: [phone] If after 3:30p, please call main pharmacy at: [phone] 03/22/2017 2:10 PM

## 2017-03-22 NOTE — Discharge Summary (Signed)
Physician Discharge Summary  Norman Rodgers WPY:[id number] DOB: 1946-09-11 DOA: 03/21/2017  PCP: Patricia Nettle, MD  Admit date: 03/21/2017 Discharge date: 03/22/2017  Time spent: 35 minutes  Recommendations for Outpatient Follow-up:  1. Dr.RIchard Groat tomorrow 4/21 in the office to re-evaluate his R eye   Discharge Diagnoses:  Principal Problem:   Dacryoadenitis, acute, right Active Problems:   Symptomatic anemia   Metastatic colon cancer to liver [facility])   Palliative care by specialist   Acute dacryoadenitis, right   Discharge Condition: stable  Diet recommendation: regular  Filed Weights   03/21/17 1726 03/21/17 2345  Weight: 58.5 kg (129 lb) 101.5 kg (223 lb 12.8 oz)    History of present illness:  Norman Rodgers is a 71 y.o. male with medical history significant of metastatic colon cancer to liver, currently on hospice for this.  Symptomatic anemia with last HGB 8.8 on 3/19 at oncology office.  Patient went to Dr. [name] office (opthalmology) for c/o pain, swelling, redness in his R eye.CT shows 45mm abscess and inflammation of lacrimal gland  Hospital Course:   1. Acute dacryoadenitis and mild bacterial conjunctivitis    -CT confirms dacryadenitis    -seen by Dr.Groat with ophthalmology and IV Vanc/ceftriaxone started, he recommended continued IV abx today, but pt is chronically ill with metastatic colon Ca under hospice care and adamant to be discharged home, I have discharged him on augmentin and bacitracin ointment per Dr.Groat's recommendations -he is advised to FU with Dr.Groat in the office tomorrow  2. Anemia in the background of metastatic colon cancer -transfused 1 unit PRBC for hb of 6.4, followed by Dr.Kale and under hospice care -refuses to stay later today to get his hb rechecked  Rest of his medical problems were stable  Consultations:  Ophthalmology Dr.RIchard Groat  Discharge Exam: Vitals:   03/22/17 1230 03/22/17 1305  BP: 125/71 119/72   Pulse: 71 72  Resp: 18   Temp: 98.3 F (36.8 C) 98.2 F (36.8 C)    General: AAOx3 HEENT: Redness/swelling of R eyelid Cardiovascular: S!S2/RRR Respiratory: CTAB  Discharge Instructions   Discharge Instructions    Diet - low sodium heart healthy    Complete by:  As directed    Increase activity slowly    Complete by:  As directed      Current Discharge Medication List    START taking these medications   Details  amoxicillin-clavulanate (AUGMENTIN) 875-125 MG tablet Take 1 tablet by mouth 2 (two) times daily. For 7days Qty: 14 tablet, Refills: 0    bacitracin-polymyxin b (POLYSPORIN) ophthalmic ointment Place 1 application into the right eye every 12 (twelve) hours. apply to eye every 12 hours while awake Qty: 3.5 g, Refills: 0      CONTINUE these medications which have NOT CHANGED   Details  amiodarone (PACERONE) 200 MG tablet Take 1 tablet (200 mg total) by mouth daily. Qty: 30 tablet, Refills: 3    carvedilol (COREG) 12.5 MG tablet Take 1 tablet (12.5 mg total) by mouth 2 (two) times daily with a meal. Qty: 60 tablet, Refills: 3    ferrous sulfate 325 (65 FE) MG EC tablet Take 1 tablet (325 mg total) by mouth daily with breakfast. Qty: 30 tablet, Refills: 3    glimepiride (AMARYL) 4 MG tablet Take 4 mg by mouth daily with breakfast.    nitroGLYCERIN (NITROSTAT) 0.4 MG SL tablet Place 1 tablet (0.4 mg total) under the tongue every 5 (five) minutes x 3 doses as needed  for chest pain. Qty: 25 tablet, Refills: 3    ramipril (ALTACE) 2.5 MG capsule Take 2 capsules (5 mg total) by mouth daily. Qty: 30 capsule, Refills: 3    rosuvastatin (CRESTOR) 40 MG tablet Take 0.5 tablets (20 mg total) by mouth daily at 6 PM. Qty: 30 tablet, Refills: 3       No Known Allergies Follow-up Information    SPRUILL,JEROME O, MD. Schedule an appointment as soon as possible for a visit in 1 week(s).   Specialty:  Cardiology Contact information: [place]  [place] [address] [phone]            The results of significant diagnostics from this hospitalization (including imaging, microbiology, ancillary and laboratory) are listed below for reference.    Significant Diagnostic Studies: Ct Orbits W Contrast  Result Date: 03/21/2017 CLINICAL DATA:  Swelling and redness of the right eye. Stage IV prostate cancer and colon cancer. EXAM: CT ORBITS WITH CONTRAST TECHNIQUE: Multidetector CT images was performed according to the standard protocol following intravenous contrast administration. CONTRAST:  19mL ISOVUE-300 IOPAMIDOL (ISOVUE-300) INJECTION 61% COMPARISON:  None. FINDINGS: Orbits: Left orbit is normal. Right orbit shows swelling of the lacrimal gland with nonenhancing areas measuring up to 8 mm consistent with lacrimal gland inflammation and early abscess. This should all be preseptal inflammation, though the gland is swollen and and extends backwards because of the swelling. No evidence of intraconal abscess. Extra-ocular muscles are intrinsically normal. Optic nerve is normal. Orbital apex is normal. Visualized sinuses: Normal Soft tissues: Otherwise normal Limited intracranial: Normal IMPRESSION: Lacrimal gland inflammation on the right with early abscess formation measuring up to 8 mm. No evidence of postseptal or intraconal extension at this time. Electronically Signed   By: Nelson Chimes M.D.   On: 03/21/2017 21:32    Microbiology: No results found for this or any previous visit (from the past 240 hour(s)).   Labs: Basic Metabolic Panel:  Recent Labs Lab 03/21/17 1907  NA 136  K 3.7  CL 103  CO2 24  GLUCOSE 130*  BUN 7  CREATININE 1.02  CALCIUM 8.6*   Liver Function Tests:  Recent Labs Lab 03/21/17 1907  AST 31  ALT 15*  ALKPHOS 110  BILITOT 0.5  PROT 7.3  ALBUMIN 2.4*   No results for input(s): LIPASE, AMYLASE in the last 168 hours. No results for input(s): AMMONIA in the last 168 hours. CBC:  Recent Labs Lab  03/21/17 1907 03/22/17 0451  WBC 8.6 9.4  NEUTROABS 5.8  --   HGB 7.1* 6.4*  HCT 23.3* 21.7*  MCV 82.6 82.2  PLT 375 336   Cardiac Enzymes: No results for input(s): CKTOTAL, CKMB, CKMBINDEX, TROPONINI in the last 168 hours. BNP: BNP (last 3 results) No results for input(s): BNP in the last 8760 hours.  ProBNP (last 3 results) No results for input(s): PROBNP in the last 8760 hours.  CBG: No results for input(s): GLUCAP in the last 168 hours.     SignedDomenic Polite MD.  [facility] 03/22/2017, 2:57 PM

## 2017-03-22 NOTE — Progress Notes (Signed)
Admitted pt.from ED ;aao x4 ,no acute distress noted.V/S taken & recorded.IV in place on LT AC.in place w/ occlusive dsd.intact.no redness noted.Orientation to room  &,call bell done.Fall assessment completed.w/ pt.& able to verbalized understanding of risks associated with falls.Call light within reach .Skin dry & refused to have his skin assessed.Noted rt.eye is red & swollen w/ yellow drainage.Will continue to monitor pt.

## 2017-03-22 NOTE — Progress Notes (Signed)
BRIEF CONSULT NOTE - formal note to follow  Pt seen at bedside at 0805 AM today and is stable with slight improvement. VA is 20/30 OD and 20/25 OS, pupils are reactive without rAPD, and there is slight improvement in aBduction OD. I recommend continuing IV antibiotics through today and reevaluating tomorrow. However, the patient is adamant about wanting to leave the hospital today. While I do not recommend this, I understand his perspective given his overall health. Additionally, he does appear to be improving on IV antibiotics. If he does insist on leaving today, I would recommend starting PO Augmentin (10-14 day course) and that he follows up with me in the office [facility], [address], [place], [place] [address]) tomorrow. Please also start a topical antibiotic ointment (e.g bacitracin or erythromycin), as he appears to have a mild bacterial conjunctivitis as well. A formal consult note will follow. I can be reached at the office at [phone] or by cell at [phone] if there are any questions or concerns.  R Wyatt Portela, MD

## 2017-03-22 NOTE — Progress Notes (Signed)
Discharged to home. Verbalized understanding of all written and verbal instructions. Scripts given, IV removed. 1 unit of PRBC's given 2 hours prior to discharge, no reactions, vitals wnl, no complaints of pain. Left unit via wheelchair, accompanied by spouse and nurse technician.

## 2017-03-22 NOTE — Progress Notes (Signed)
Patient running low grade temperatures of 100-100.2 or so, has not been transfused yet.  Will therefore hold off on transfusion for the moment.  2 units will be stored in blood bank.

## 2017-03-22 NOTE — Consult Note (Signed)
Ophthalmology Initial Consult Note  Kanton, Kamel, 71 y.o. male Date of Service:  03/22/2017  Requesting physician: Domenic Polite, MD  Information Obtained from: patient Chief Complaint:  Right orbital swelling  HPI/Discussion:  HILMAN KISSLING is a 70 y.o. man with a complex PMHx that includes stage 4 prostate cancer and DM who presented to my clinic on 03/21/2017 with a 1-day history of acute-onset right periorbital swelling involving the upper and lower lids and the eye itself. On initial exam that day, he was noted to have temporal chemosis and mild gaze limitation on aBduction; this was consistent with a probable right dacryoadenitis. VA was normal, IOP was normal, there was no proptosis, and dilated exam was normal. He also reports a similar presentation many years ago, but no records could be obtained. Out of an abundance of caution, the patient was sent to the [facility] ER for CT orbits to better evaluate for post-septal extension. Imaging showed focal inflammation of the lacrimal gland with abscess formation, but otherwise did not demonstrate any additional, post-septal extension or sinus blockage. He was started on IV vancomycin and Rocephin and admitted for an overnight stay.  Interval History: Pt feels about the same today. He complains of mild discharge, but otherwise no worse. He denies VA changes or pain. He wants to go home.  Past Ocular Hx:  CE/IOL OU, remote history of periorbital swelling many years ago (etiology unclear) Ocular Meds:  None Family ocular history: Noncontributory  Past Medical History:  Diagnosis Date  . Cancer ([place])   . Diabetes mellitus without complication ([place])   . Hypertension    Past Surgical History:  Procedure Laterality Date  . COLONOSCOPY N/A 12/26/2016   Procedure: COLONOSCOPY;  Surgeon: Juanita Craver, MD;  Location: [facility] ENDOSCOPY;  Service: Endoscopy;  Laterality: N/A;  . ESOPHAGOGASTRODUODENOSCOPY N/A 12/26/2016   Procedure:  ESOPHAGOGASTRODUODENOSCOPY (EGD);  Surgeon: Juanita Craver, MD;  Location: [facility] ENDOSCOPY;  Service: Endoscopy;  Laterality: N/A;  . LEFT HEART CATHETERIZATION WITH CORONARY ANGIOGRAM N/A 01/22/2012   Procedure: LEFT HEART CATHETERIZATION WITH CORONARY ANGIOGRAM;  Surgeon: Clent Demark, MD;  Location: [place] CATH LAB;  Service: Cardiovascular;  Laterality: N/A;  . SUPRAVENTRICULAR TACHYCARDIA ABLATION N/A 01/17/2012   Procedure: SUPRAVENTRICULAR TACHYCARDIA ABLATION;  Surgeon: Evans Lance, MD;  Location[facility] CATH LAB;  Service: Cardiovascular;  Laterality: N/A;    Prior to Admission Meds: Prescriptions Prior to Admission  Medication Sig Dispense Refill Last Dose  . amiodarone (PACERONE) 200 MG tablet Take 1 tablet (200 mg total) by mouth daily. 30 tablet 3 03/20/2017 at Unknown time  . carvedilol (COREG) 12.5 MG tablet Take 1 tablet (12.5 mg total) by mouth 2 (two) times daily with a meal. 60 tablet 3 03/20/2017 at 1800  . ferrous sulfate 325 (65 FE) MG EC tablet Take 1 tablet (325 mg total) by mouth daily with breakfast. 30 tablet 3 03/20/2017 at Unknown time  . glimepiride (AMARYL) 4 MG tablet Take 4 mg by mouth daily with breakfast.   03/20/2017 at Unknown time  . nitroGLYCERIN (NITROSTAT) 0.4 MG SL tablet Place 1 tablet (0.4 mg total) under the tongue every 5 (five) minutes x 3 doses as needed for chest pain. 25 tablet 3 unknown  . ramipril (ALTACE) 2.5 MG capsule Take 2 capsules (5 mg total) by mouth daily. 30 capsule 3 03/20/2017 at Unknown time  . rosuvastatin (CRESTOR) 40 MG tablet Take 0.5 tablets (20 mg total) by mouth daily at 6 PM. 30 tablet 3 03/20/2017 at Unknown time  Inpatient Meds: Vancomycin IV Rocephin IV  No Known Allergies Social History  Substance Use Topics  . Smoking status: Never Smoker  . Smokeless tobacco: Never Used  . Alcohol use Yes   Family History  Problem Relation Age of Onset  . Hypertension Other     ROS: Other than ROS in the HPI, all other systems were  negative.  Exam: Temp: 99.7 F (37.6 C) Pulse Rate: 77 BP: 129/72 Resp: 17 SpO2: 99 %  Visual Acuity:  near   OD 20/30   OS 2025     OD OS  Confr Vis Fields Full Full  EOM (Primary) -0.5 aBduction deficit (slightly better), otherwise full Full  Lids/Lashes 2+ upper lid edema, 1+ lower lid edema, mild tenderness upper lid temporally; mild mucopurulent discharge Normal  Conjunctiva Trace chemosis (improving), 1+ injection White, quiet  Adnexa  As above Normal  Pupils  3 --> 2, somewhat brisk, no rAPD 3 --> 2, somewhat brisk, no rAPD  Cornea  Clear Clear  Anterior Chamber Formed, grossly quiet Formed, grossly quiet  Lens:  IOL IOL  IOP 15 15  Fundus - Dilated? NOT DONE (notably normal on 03/21/2017 in ophtho clinic)    Neuro:  Oriented to person, place, and time:  Yes Psychiatric:  Mood and Affect Appropriate:  Yes  Labs/imaging:  CT orbits (03/21/2017) Lacrimal gland inflammation on the right with early abscess formation measuring up to 8 mm. No evidence of postseptal or intraconal extension at this time. Sinuses appear clear.  A/P:  71 y.o. male with acute right dacryoadenitis  1) Acute dacryoadenitis, right and 2) Bacterial conjunctivitis, right eye - Slight but definite improvement  - Chemosis is resolving, gaze limitation is better, and periorbital swelling is slightly better. - CT confirms suspicion for dacyroadenitis and shows no evidence of extension or sinus involvement. - My recommendation is that the patient continue IV antibiotics (vanc/Rocephin) through today, and, if further improvement, be switched to PO Augmentin tomorrow at the earliest. - Please also start a topical antiobiotic (e.g. bacitracin ointment) TID, as he has a mild bacterial conjunctivitis as well. - However, the patient is adamant about wanting to be discharged today. While I do not agree with this approach, I certainly understand his perspective given his overall health. If he insists on doing so,  please discharge him on PO Augmentin and arrange for him to see me in my office tomorrow. It is okay to give him my cell phone [phone]).  [facility] [phone] [address]. [address] [place], [place] [address]  R Wyatt Portela, MD  [place], MD 03/22/2017, 8:40 AM

## 2017-03-25 LAB — TYPE AND SCREEN
ABO/RH(D): AB POS
ANTIBODY SCREEN: NEGATIVE
Unit division: 0
Unit division: 0

## 2017-03-25 LAB — BPAM RBC
BLOOD PRODUCT EXPIRATION DATE: 201805102359
Blood Product Expiration Date: 201805102359
ISSUE DATE / TIME: 201804201215
UNIT TYPE AND RH: 7300
Unit Type and Rh: 7300

## 2017-04-18 DIAGNOSIS — E119 Type 2 diabetes mellitus without complications: Secondary | ICD-10-CM | POA: Diagnosis not present

## 2017-04-18 DIAGNOSIS — E785 Hyperlipidemia, unspecified: Secondary | ICD-10-CM | POA: Diagnosis not present

## 2017-04-18 DIAGNOSIS — I252 Old myocardial infarction: Secondary | ICD-10-CM | POA: Diagnosis not present

## 2017-04-18 DIAGNOSIS — I2510 Atherosclerotic heart disease of native coronary artery without angina pectoris: Secondary | ICD-10-CM | POA: Diagnosis not present

## 2017-04-18 DIAGNOSIS — I10 Essential (primary) hypertension: Secondary | ICD-10-CM | POA: Diagnosis not present

## 2017-04-18 NOTE — Assessment & Plan Note (Signed)
Metastatic Colon cancer with liver mets:  Current plan: 1. Hospice care 2. blood transfusions for symptomatic anemia  His hemoglobin today is 8.8. He is not very symptomatic. I did not recommend blood transfusion. Prognosis: Patient does not wish to receive chemotherapy. His prognosis is poor. Return to clinic in 2 months with blood work and follow-up.

## 2017-04-19 ENCOUNTER — Ambulatory Visit (HOSPITAL_BASED_OUTPATIENT_CLINIC_OR_DEPARTMENT_OTHER): Payer: Medicare Other | Admitting: Hematology and Oncology

## 2017-04-19 ENCOUNTER — Encounter: Payer: Self-pay | Admitting: Hematology and Oncology

## 2017-04-19 ENCOUNTER — Other Ambulatory Visit (HOSPITAL_BASED_OUTPATIENT_CLINIC_OR_DEPARTMENT_OTHER): Payer: Medicare Other

## 2017-04-19 DIAGNOSIS — C787 Secondary malignant neoplasm of liver and intrahepatic bile duct: Secondary | ICD-10-CM

## 2017-04-19 DIAGNOSIS — C189 Malignant neoplasm of colon, unspecified: Secondary | ICD-10-CM

## 2017-04-19 DIAGNOSIS — C182 Malignant neoplasm of ascending colon: Secondary | ICD-10-CM

## 2017-04-19 DIAGNOSIS — D649 Anemia, unspecified: Secondary | ICD-10-CM

## 2017-04-19 LAB — COMPREHENSIVE METABOLIC PANEL
ALT: 14 U/L (ref 0–55)
ANION GAP: 8 meq/L (ref 3–11)
AST: 35 U/L — AB (ref 5–34)
Albumin: 2.3 g/dL — ABNORMAL LOW (ref 3.5–5.0)
Alkaline Phosphatase: 130 U/L (ref 40–150)
BUN: 10 mg/dL (ref 7.0–26.0)
CALCIUM: 8.8 mg/dL (ref 8.4–10.4)
CHLORIDE: 109 meq/L (ref 98–109)
CO2: 23 mEq/L (ref 22–29)
Creatinine: 0.9 mg/dL (ref 0.7–1.3)
Glucose: 119 mg/dl (ref 70–140)
POTASSIUM: 3.9 meq/L (ref 3.5–5.1)
Sodium: 141 mEq/L (ref 136–145)
Total Bilirubin: 0.6 mg/dL (ref 0.20–1.20)
Total Protein: 7.6 g/dL (ref 6.4–8.3)

## 2017-04-19 LAB — CEA (IN HOUSE-CHCC): CEA (CHCC-In House): 6352.58 ng/mL — ABNORMAL HIGH (ref 0.00–5.00)

## 2017-04-19 LAB — CBC WITH DIFFERENTIAL/PLATELET
BASO%: 0.1 % (ref 0.0–2.0)
BASOS ABS: 0 10*3/uL (ref 0.0–0.1)
EOS%: 1.6 % (ref 0.0–7.0)
Eosinophils Absolute: 0.1 10*3/uL (ref 0.0–0.5)
HEMATOCRIT: 25.6 % — AB (ref 38.4–49.9)
HGB: 7.5 g/dL — ABNORMAL LOW (ref 13.0–17.1)
LYMPH#: 1.3 10*3/uL (ref 0.9–3.3)
LYMPH%: 17.4 % (ref 14.0–49.0)
MCH: 24.7 pg — AB (ref 27.2–33.4)
MCHC: 29.3 g/dL — AB (ref 32.0–36.0)
MCV: 84.2 fL (ref 79.3–98.0)
MONO#: 0.9 10*3/uL (ref 0.1–0.9)
MONO%: 12 % (ref 0.0–14.0)
NEUT#: 5.2 10*3/uL (ref 1.5–6.5)
NEUT%: 68.9 % (ref 39.0–75.0)
PLATELETS: 296 10*3/uL (ref 140–400)
RBC: 3.04 10*6/uL — ABNORMAL LOW (ref 4.20–5.82)
RDW: 18.5 % — ABNORMAL HIGH (ref 11.0–14.6)
WBC: 7.5 10*3/uL (ref 4.0–10.3)

## 2017-04-19 LAB — IRON AND TIBC
%SAT: 7 % — AB (ref 20–55)
IRON: 15 ug/dL — AB (ref 42–163)
TIBC: 195 ug/dL — AB (ref 202–409)
UIBC: 180 ug/dL (ref 117–376)

## 2017-04-19 LAB — FERRITIN: FERRITIN: 39 ng/mL (ref 22–316)

## 2017-04-19 NOTE — Progress Notes (Signed)
Patient Care Team: Wallene Huh, MD as PCP - General (Cardiology)  DIAGNOSIS:  Encounter Diagnosis  Name Primary?  . Metastatic colon cancer to liver ([place])     SUMMARY OF ONCOLOGIC HISTORY:   Metastatic colon cancer to liver ([place])   12/26/2016 Initial Diagnosis    Colon Biopsy mid to distal right ascending colon: Adenocarcinoma       CHIEF COMPLIANT: Follow-up of metastatic colon cancer with liver metastases  INTERVAL HISTORY: Norman Rodgers is a 71 year old with above-mentioned history of colon cancer metastatic to the liver who is currently in hospice care and is coming periodically to check his symptoms and to determine if he needs blood transfusion. Last month he was admitted to the hospital with an eye infection and was found to be anemic at 6.4 g and received 1 unit of packed red cells. Today's hemoglobin is 7.5 and he does not report any worsening symptoms of anemia. Because of this we do not need to give him blood. He has not had any major symptoms other than abdominal discomfort which is fairly mild. He reports decreased appetite.  REVIEW OF SYSTEMS:   Constitutional: Denies fevers, chills or abnormal weight loss Eyes: Denies blurriness of vision Ears, nose, mouth, throat, and face: Denies mucositis or sore throat Respiratory: Denies cough, dyspnea or wheezes Cardiovascular: Denies palpitation, chest discomfort Gastrointestinal:  Mild abdominal pain and decreased appetite Skin: Denies abnormal skin rashes Lymphatics: Denies new lymphadenopathy or easy bruising Neurological:Denies numbness, tingling or new weaknesses Behavioral/Psych: Mood is stable, no new changes  Extremities: No lower extremity edema  All other systems were reviewed with the patient and are negative.  I have reviewed the past medical history, past surgical history, social history and family history with the patient and they are unchanged from previous note.  ALLERGIES:  has No Known  Allergies.  MEDICATIONS:  Current Outpatient Prescriptions  Medication Sig Dispense Refill  . amiodarone (PACERONE) 200 MG tablet Take 1 tablet (200 mg total) by mouth daily. 30 tablet 3  . amoxicillin-clavulanate (AUGMENTIN) 875-125 MG tablet Take 1 tablet by mouth 2 (two) times daily. For 7days 14 tablet 0  . bacitracin-polymyxin b (POLYSPORIN) ophthalmic ointment Place 1 application into the right eye every 12 (twelve) hours. apply to eye every 12 hours while awake 3.5 g 0  . carvedilol (COREG) 12.5 MG tablet Take 1 tablet (12.5 mg total) by mouth 2 (two) times daily with a meal. 60 tablet 3  . ferrous sulfate 325 (65 FE) MG EC tablet Take 1 tablet (325 mg total) by mouth daily with breakfast. 30 tablet 3  . glimepiride (AMARYL) 4 MG tablet Take 4 mg by mouth daily with breakfast.    . nitroGLYCERIN (NITROSTAT) 0.4 MG SL tablet Place 1 tablet (0.4 mg total) under the tongue every 5 (five) minutes x 3 doses as needed for chest pain. 25 tablet 3  . ramipril (ALTACE) 2.5 MG capsule Take 2 capsules (5 mg total) by mouth daily. 30 capsule 3  . rosuvastatin (CRESTOR) 40 MG tablet Take 0.5 tablets (20 mg total) by mouth daily at 6 PM. 30 tablet 3   No current facility-administered medications for this visit.     PHYSICAL EXAMINATION: ECOG PERFORMANCE STATUS: 2 - Symptomatic, <50% confined to bed  Vitals:   04/19/17 1128  BP: (!) 152/72  Pulse: 68  Resp: 19  Temp: 98.1 F (36.7 C)   Filed Weights   04/19/17 1128  Weight: 226 lb 12.8 oz (102.9 kg)  GENERAL:alert, no distress and comfortable, generalized weakness and using a wheelchair to come to the appointment SKIN: skin color, texture, turgor are normal, no rashes or significant lesions EYES: normal, Conjunctiva are pink and non-injected, sclera clear OROPHARYNX:no exudate, no erythema and lips, buccal mucosa, and tongue normal  NECK: supple, thyroid normal size, non-tender, without nodularity LYMPH:  no palpable lymphadenopathy  in the cervical, axillary or inguinal LUNGS: clear to auscultation and percussion with normal breathing effort HEART: regular rate & rhythm and no murmurs and no lower extremity edema ABDOMEN:abdomen soft, non-tender and normal bowel sounds MUSCULOSKELETAL:no cyanosis of digits and no clubbing  NEURO: alert & oriented x 3 with fluent speech, no focal motor/sensory deficits EXTREMITIES: No lower extremity edema  LABORATORY DATA:  I have reviewed the data as listed   Chemistry      Component Value Date/Time   NA 136 03/21/2017 1907   NA 136 02/18/2017 1137   K 3.7 03/21/2017 1907   K 4.2 02/18/2017 1137   CL 103 03/21/2017 1907   CO2 24 03/21/2017 1907   CO2 23 02/18/2017 1137   BUN 7 03/21/2017 1907   BUN 12.8 02/18/2017 1137   CREATININE 1.02 03/21/2017 1907   CREATININE 1.0 02/18/2017 1137      Component Value Date/Time   CALCIUM 8.6 (L) 03/21/2017 1907   CALCIUM 9.4 02/18/2017 1137   ALKPHOS 110 03/21/2017 1907   ALKPHOS 113 02/18/2017 1137   AST 31 03/21/2017 1907   AST 36 (H) 02/18/2017 1137   ALT 15 (L) 03/21/2017 1907   ALT 17 02/18/2017 1137   BILITOT 0.5 03/21/2017 1907   BILITOT 0.57 02/18/2017 1137       Lab Results  Component Value Date   WBC 7.5 04/19/2017   HGB 7.5 (L) 04/19/2017   HCT 25.6 (L) 04/19/2017   MCV 84.2 04/19/2017   PLT 296 04/19/2017   NEUTROABS 5.2 04/19/2017    ASSESSMENT & PLAN:  Metastatic colon cancer to liver [facility]) Metastatic Colon cancer with liver mets:  Current plan: 1. Hospice care 2. blood transfusions for symptomatic anemia, his hemoglobin was 6.4 in April and he received 1 unit of PRBC  His hemoglobin today is 7.5 g. He is not very symptomatic. I did not recommend blood transfusion. Prognosis: Patient does not wish to receive chemotherapy. His prognosis is poor. Return to clinic in 3 months with blood work and follow-up. He will come sooner if he feels symptoms of anemia I spent 25 minutes talking to the patient  of which more than half was spent in counseling and coordination of care.  No orders of the defined types were placed in this encounter.  The patient has a good understanding of the overall plan. he agrees with it. he will call with any problems that may develop before the next visit here.   Rulon Eisenmenger, MD 04/19/17

## 2017-05-20 ENCOUNTER — Telehealth: Payer: Self-pay | Admitting: *Deleted

## 2017-05-20 NOTE — Telephone Encounter (Signed)
"  I'd like to talk with Dr. Lindi Adie or his nurse.  He asked [place] to call if [place] starts feeling weak.  He usually wakes up at 11:30 or 12:00.  Today he woke up at 1:00 pm.  C/O feeling weak.  Did eat eggs, grits and bacon.  He drinks well.  Bladder working no problems.  Has not shared bowel movements with me.  He drove to and from [place] outside of [place] yesterday.  Maybe the trip was too much.  Need to know if he is to come there to see Dr. Lindi Adie or go to the hospital.  I'll give him till tomorrow and call back if he is not any better."  Return number [phone].    Aware Dr. Lindi Adie returns next Monday with partners covering if anything is needed this week.  Symptoms shared do not indicate ED level of service at this time.  Asked for return call with update tomorrow.  Awaiting return call.

## 2017-05-28 ENCOUNTER — Telehealth: Payer: Self-pay

## 2017-05-28 ENCOUNTER — Other Ambulatory Visit: Payer: Self-pay

## 2017-05-28 DIAGNOSIS — C189 Malignant neoplasm of colon, unspecified: Secondary | ICD-10-CM

## 2017-05-28 DIAGNOSIS — C787 Secondary malignant neoplasm of liver and intrahepatic bile duct: Principal | ICD-10-CM

## 2017-05-28 NOTE — Telephone Encounter (Signed)
Pt wife calling to report that pt has been feeling weak and tired for 2 weeks now. Pt not eating as well but is hydrating okay. Pt still gets up to go to the bathroom, but for the most part, pt has no energy and has been laying in bed a lot. Pt would like to see Dr.Gudena and find out if he will need blood transfusion. Pt denies sob/cp at this time. Scheduled pt to see Dr.Gudena on Thursday. Told wife to offer protein supplement shakes and to keep him hydrated as much as possible. Told pt to call or go to ED if his symptoms worsen before scheduled appt. Pt wife thankful and verbalized understanding. Confirmed appt with time/date.

## 2017-05-30 ENCOUNTER — Ambulatory Visit (HOSPITAL_BASED_OUTPATIENT_CLINIC_OR_DEPARTMENT_OTHER): Payer: Medicare Other | Admitting: Hematology and Oncology

## 2017-05-30 ENCOUNTER — Other Ambulatory Visit (HOSPITAL_BASED_OUTPATIENT_CLINIC_OR_DEPARTMENT_OTHER): Payer: Medicare Other

## 2017-05-30 ENCOUNTER — Encounter: Payer: Self-pay | Admitting: Hematology and Oncology

## 2017-05-30 VITALS — BP 111/56 | HR 102 | Temp 98.3°F | Resp 17 | Ht 72.0 in | Wt 208.6 lb

## 2017-05-30 DIAGNOSIS — C182 Malignant neoplasm of ascending colon: Secondary | ICD-10-CM | POA: Diagnosis not present

## 2017-05-30 DIAGNOSIS — C787 Secondary malignant neoplasm of liver and intrahepatic bile duct: Secondary | ICD-10-CM

## 2017-05-30 DIAGNOSIS — D649 Anemia, unspecified: Secondary | ICD-10-CM | POA: Diagnosis not present

## 2017-05-30 DIAGNOSIS — C189 Malignant neoplasm of colon, unspecified: Secondary | ICD-10-CM

## 2017-05-30 LAB — COMPREHENSIVE METABOLIC PANEL
ALBUMIN: 2.2 g/dL — AB (ref 3.5–5.0)
ALK PHOS: 132 U/L (ref 40–150)
ALT: 8 U/L (ref 0–55)
ANION GAP: 12 meq/L — AB (ref 3–11)
AST: 35 U/L — ABNORMAL HIGH (ref 5–34)
BILIRUBIN TOTAL: 1.51 mg/dL — AB (ref 0.20–1.20)
BUN: 9.3 mg/dL (ref 7.0–26.0)
CALCIUM: 9.1 mg/dL (ref 8.4–10.4)
CO2: 21 meq/L — AB (ref 22–29)
CREATININE: 1.1 mg/dL (ref 0.7–1.3)
Chloride: 102 mEq/L (ref 98–109)
EGFR: 76 mL/min/{1.73_m2} — AB (ref >90)
Glucose: 159 mg/dl — ABNORMAL HIGH (ref 70–140)
Potassium: 4.1 mEq/L (ref 3.5–5.1)
Sodium: 136 mEq/L (ref 136–145)
TOTAL PROTEIN: 8.7 g/dL — AB (ref 6.4–8.3)

## 2017-05-30 LAB — CBC WITH DIFFERENTIAL/PLATELET
BASO%: 0.3 % (ref 0.0–2.0)
Basophils Absolute: 0 10*3/uL (ref 0.0–0.1)
EOS ABS: 0 10*3/uL (ref 0.0–0.5)
EOS%: 0.5 % (ref 0.0–7.0)
HEMATOCRIT: 25.1 % — AB (ref 38.4–49.9)
HGB: 7.3 g/dL — ABNORMAL LOW (ref 13.0–17.1)
LYMPH#: 1 10*3/uL (ref 0.9–3.3)
LYMPH%: 13.7 % — AB (ref 14.0–49.0)
MCH: 24.3 pg — ABNORMAL LOW (ref 27.2–33.4)
MCHC: 29.1 g/dL — ABNORMAL LOW (ref 32.0–36.0)
MCV: 83.4 fL (ref 79.3–98.0)
MONO#: 0.8 10*3/uL (ref 0.1–0.9)
MONO%: 11.1 % (ref 0.0–14.0)
NEUT%: 74.4 % (ref 39.0–75.0)
NEUTROS ABS: 5.5 10*3/uL (ref 1.5–6.5)
PLATELETS: 429 10*3/uL — AB (ref 140–400)
RBC: 3.01 10*6/uL — AB (ref 4.20–5.82)
RDW: 20 % — ABNORMAL HIGH (ref 11.0–14.6)
WBC: 7.4 10*3/uL (ref 4.0–10.3)

## 2017-05-30 LAB — CEA (IN HOUSE-CHCC)

## 2017-05-30 MED ORDER — MEGESTROL ACETATE 625 MG/5ML PO SUSP: 625.0000 mg | Freq: Every day | ORAL | 0 refills | Status: AC

## 2017-05-30 NOTE — Assessment & Plan Note (Signed)
Metastatic Colon cancer with liver mets:  Current plan: 1. Hospice care 2. blood transfusions for symptomatic anemia  His hemoglobin today is 7.3. He is not very symptomatic. I did not recommend blood transfusion. Prognosis: Patient does not wish to receive chemotherapy. His prognosis is poor.  Poor appetite: I gave a prescription for Megace.  Return to clinic in August with blood work and follow-up.

## 2017-05-30 NOTE — Progress Notes (Signed)
Patient Care Team: Wallene Huh, MD as PCP - General (Cardiology)  DIAGNOSIS:  Encounter Diagnosis  Name Primary?  . Metastatic colon cancer to liver ([place]) Yes    SUMMARY OF ONCOLOGIC HISTORY:   Metastatic colon cancer to liver ([place])   12/26/2016 Initial Diagnosis    Colon Biopsy mid to distal right ascending colon: Adenocarcinoma       CHIEF COMPLIANT: Follow-up of metastatic colon cancer not on therapy  INTERVAL HISTORY: Norman Rodgers is a 71 year old with above-mentioned symptoms metastatic colon cancer who is currently on therapy because he does not wish to receive treatment. He is in hospice care. He comes in periodically to get his blood work checked. His wife tells me that he is in bed for the past several days. He has not been eating or drinking. He is to do in a wheelchair. He reports that he has no appetite and does not want to eat anything.   REVIEW OF SYSTEMS:   Constitutional:Loss of appetite and weight  Eyes: Denies blurriness of vision Ears, nose, mouth, throat, and face: Denies mucositis or sore throat Respiratory: Denies cough, dyspnea or wheezes Cardiovascular: Denies palpitation, chest discomfort Gastrointestinal:  Denies nausea, heartburn or change in bowel habits Skin: Denies abnormal skin rashes Lymphatics: Denies new lymphadenopathy or easy bruising Neurological:Denies numbness, tingling or new weaknesses Behavioral/Psych: Mood is stable, no new changes  Extremities: No lower extremity edema All other systems were reviewed with the patient and are negative.  I have reviewed the past medical history, past surgical history, social history and family history with the patient and they are unchanged from previous note.  ALLERGIES:  has No Known Allergies.  MEDICATIONS:  Current Outpatient Prescriptions  Medication Sig Dispense Refill  . amiodarone (PACERONE) 200 MG tablet Take 1 tablet (200 mg total) by mouth daily. 30 tablet 3  .  amoxicillin-clavulanate (AUGMENTIN) 875-125 MG tablet Take 1 tablet by mouth 2 (two) times daily. For 7days 14 tablet 0  . bacitracin-polymyxin b (POLYSPORIN) ophthalmic ointment Place 1 application into the right eye every 12 (twelve) hours. apply to eye every 12 hours while awake 3.5 g 0  . carvedilol (COREG) 12.5 MG tablet Take 1 tablet (12.5 mg total) by mouth 2 (two) times daily with a meal. 60 tablet 3  . ferrous sulfate 325 (65 FE) MG EC tablet Take 1 tablet (325 mg total) by mouth daily with breakfast. 30 tablet 3  . glimepiride (AMARYL) 4 MG tablet Take 4 mg by mouth daily with breakfast.    . megestrol (MEGACE ES) 625 MG/5ML suspension Take 5 mLs (625 mg total) by mouth daily. 150 mL 0  . nitroGLYCERIN (NITROSTAT) 0.4 MG SL tablet Place 1 tablet (0.4 mg total) under the tongue every 5 (five) minutes x 3 doses as needed for chest pain. 25 tablet 3  . ramipril (ALTACE) 2.5 MG capsule Take 2 capsules (5 mg total) by mouth daily. 30 capsule 3  . rosuvastatin (CRESTOR) 40 MG tablet Take 0.5 tablets (20 mg total) by mouth daily at 6 PM. 30 tablet 3   No current facility-administered medications for this visit.     PHYSICAL EXAMINATION: ECOG PERFORMANCE STATUS: 1 - Symptomatic but completely ambulatory  Vitals:   05/30/17 1331  BP: (!) 111/56  Pulse: (!) 102  Resp: 17  Temp: 98.3 F (36.8 C)   Filed Weights   05/30/17 1331  Weight: 208 lb 9.6 oz (94.6 kg)    GENERAL:alert, no distress and comfortable SKIN: skin  color, texture, turgor are normal, no rashes or significant lesions EYES: normal, Conjunctiva are pink and non-injected, sclera clear OROPHARYNX:no exudate, no erythema and lips, buccal mucosa, and tongue normal  NECK: supple, thyroid normal size, non-tender, without nodularity LYMPH:  no palpable lymphadenopathy in the cervical, axillary or inguinal LUNGS: clear to auscultation and percussion with normal breathing effort HEART: regular rate & rhythm and no murmurs and  no lower extremity edema ABDOMEN:abdomen soft, non-tender and normal bowel sounds MUSCULOSKELETAL:no cyanosis of digits and no clubbing  NEURO: alert & oriented x 3 with fluent speech, no focal motor/sensory deficits EXTREMITIES: No lower extremity edema  LABORATORY DATA:  I have reviewed the data as listed   Chemistry      Component Value Date/Time   NA 136 05/30/2017 1307   K 4.1 05/30/2017 1307   CL 103 03/21/2017 1907   CO2 21 (L) 05/30/2017 1307   BUN 9.3 05/30/2017 1307   CREATININE 1.1 05/30/2017 1307      Component Value Date/Time   CALCIUM 9.1 05/30/2017 1307   ALKPHOS 132 05/30/2017 1307   AST 35 (H) 05/30/2017 1307   ALT 8 05/30/2017 1307   BILITOT 1.51 (H) 05/30/2017 1307       Lab Results  Component Value Date   WBC 7.4 05/30/2017   HGB 7.3 (L) 05/30/2017   HCT 25.1 (L) 05/30/2017   MCV 83.4 05/30/2017   PLT 429 (H) 05/30/2017   NEUTROABS 5.5 05/30/2017    ASSESSMENT & PLAN:  Metastatic colon cancer to liver [facility]) Metastatic Colon cancer with liver mets:  Current plan: 1. Hospice care 2. blood transfusions for symptomatic anemia  His hemoglobin today is 7.3. He is not very symptomatic. I did not recommend blood transfusion. Prognosis: Patient does not wish to receive chemotherapy. His prognosis is poor.  Poor appetite: I gave a prescription for Megace.  Return to clinic in August with blood work and follow-up.   I spent 25 minutes talking to the patient of which more than half was spent in counseling and coordination of care.  Orders Placed This Encounter  Procedures  . CBC with Differential    Standing Status:   Future    Standing Expiration Date:   05/30/2018  . Comprehensive metabolic panel    Standing Status:   Future    Standing Expiration Date:   05/30/2018  . CEA    Standing Status:   Future    Standing Expiration Date:   07/04/2018   The patient has a good understanding of the overall plan. he agrees with it. he will call with any  problems that may develop before the next visit here.   Rulon Eisenmenger, MD 05/30/17

## 2017-07-12 ENCOUNTER — Telehealth: Payer: Self-pay | Admitting: Hematology and Oncology

## 2017-07-12 ENCOUNTER — Other Ambulatory Visit: Payer: Medicare Other

## 2017-07-12 ENCOUNTER — Ambulatory Visit: Payer: Medicare Other | Admitting: Hematology and Oncology

## 2017-07-12 NOTE — Telephone Encounter (Signed)
Thank you. Dr.Gudena notified.

## 2017-07-12 NOTE — Telephone Encounter (Signed)
Patient's wife called to cancel his appointment due to him not feeling well

## 2017-07-12 NOTE — Assessment & Plan Note (Deleted)
Metastatic Colon cancer with liver mets:  Current plan: 1. Hospice care 2. blood transfusions for symptomatic anemia  His hemoglobin today is 7.3. He is not very symptomatic. I did not recommend blood transfusion. Prognosis: Patient does not wishto receive chemotherapy. His prognosis is poor.  CEA July 2018: 13,911 CEA June 2018: 6352 Poor appetite: On Megace.  Return to clinic in 2 months  with blood work and follow-up

## 2017-07-22 ENCOUNTER — Other Ambulatory Visit: Payer: Self-pay

## 2017-07-22 ENCOUNTER — Ambulatory Visit: Payer: Medicare Other

## 2017-07-22 ENCOUNTER — Ambulatory Visit (HOSPITAL_BASED_OUTPATIENT_CLINIC_OR_DEPARTMENT_OTHER): Payer: Medicare Other | Admitting: Hematology and Oncology

## 2017-07-22 ENCOUNTER — Ambulatory Visit (HOSPITAL_COMMUNITY)
Admission: RE | Admit: 2017-07-22 | Discharge: 2017-07-22 | Disposition: A | Discharge status: Home / Self Care | Payer: Medicare Other | Source: Ambulatory Visit | Attending: Hematology and Oncology | Admitting: Hematology and Oncology

## 2017-07-22 ENCOUNTER — Other Ambulatory Visit (HOSPITAL_BASED_OUTPATIENT_CLINIC_OR_DEPARTMENT_OTHER): Payer: Medicare Other

## 2017-07-22 ENCOUNTER — Encounter (INDEPENDENT_AMBULATORY_CARE_PROVIDER_SITE_OTHER): Payer: Self-pay

## 2017-07-22 DIAGNOSIS — C787 Secondary malignant neoplasm of liver and intrahepatic bile duct: Secondary | ICD-10-CM | POA: Diagnosis not present

## 2017-07-22 DIAGNOSIS — C182 Malignant neoplasm of ascending colon: Secondary | ICD-10-CM

## 2017-07-22 DIAGNOSIS — D649 Anemia, unspecified: Secondary | ICD-10-CM | POA: Diagnosis not present

## 2017-07-22 DIAGNOSIS — C189 Malignant neoplasm of colon, unspecified: Secondary | ICD-10-CM

## 2017-07-22 LAB — CBC WITH DIFFERENTIAL/PLATELET
BASO%: 0.1 % (ref 0.0–2.0)
BASOS ABS: 0 10*3/uL (ref 0.0–0.1)
EOS ABS: 0 10*3/uL (ref 0.0–0.5)
EOS%: 0.1 % (ref 0.0–7.0)
HEMATOCRIT: 18.9 % — AB (ref 38.4–49.9)
HEMOGLOBIN: 5.3 g/dL — AB (ref 13.0–17.1)
LYMPH%: 13.3 % — ABNORMAL LOW (ref 14.0–49.0)
MCH: 22.8 pg — AB (ref 27.2–33.4)
MCHC: 28 g/dL — ABNORMAL LOW (ref 32.0–36.0)
MCV: 81.5 fL (ref 79.3–98.0)
MONO#: 0.9 10*3/uL (ref 0.1–0.9)
MONO%: 10.3 % (ref 0.0–14.0)
NEUT%: 76.2 % — ABNORMAL HIGH (ref 39.0–75.0)
NEUTROS ABS: 6.4 10*3/uL (ref 1.5–6.5)
Platelets: 418 10*3/uL — ABNORMAL HIGH (ref 140–400)
RBC: 2.32 10*6/uL — ABNORMAL LOW (ref 4.20–5.82)
RDW: 20.5 % — AB (ref 11.0–14.6)
WBC: 8.3 10*3/uL (ref 4.0–10.3)
lymph#: 1.1 10*3/uL (ref 0.9–3.3)

## 2017-07-22 LAB — COMPREHENSIVE METABOLIC PANEL
ALBUMIN: 1.4 g/dL — AB (ref 3.5–5.0)
ALK PHOS: 161 U/L — AB (ref 40–150)
ALT: 10 U/L (ref 0–55)
AST: 33 U/L (ref 5–34)
Anion Gap: 8 mEq/L (ref 3–11)
BILIRUBIN TOTAL: 2.73 mg/dL — AB (ref 0.20–1.20)
BUN: 7.7 mg/dL (ref 7.0–26.0)
CALCIUM: 7.9 mg/dL — AB (ref 8.4–10.4)
CO2: 21 mEq/L — ABNORMAL LOW (ref 22–29)
CREATININE: 0.8 mg/dL (ref 0.7–1.3)
Chloride: 104 mEq/L (ref 98–109)
EGFR: >90 mL/min/{1.73_m2} (ref >90)
GLUCOSE: 100 mg/dL (ref 70–140)
POTASSIUM: 3.3 meq/L — AB (ref 3.5–5.1)
SODIUM: 133 meq/L — AB (ref 136–145)
TOTAL PROTEIN: 7.2 g/dL (ref 6.4–8.3)

## 2017-07-22 LAB — ABO/RH: ABO/RH(D): AB POS

## 2017-07-22 NOTE — Assessment & Plan Note (Signed)
Metastatic Colon cancer with liver mets:  Current plan: 1. Hospice care 2. blood transfusions for symptomatic anemia  His hemoglobin today is 7.3. He is not very symptomatic. I did not recommend blood transfusion. Prognosis: Patient does not wishto receive chemotherapy. His prognosis is poor.  CEA July 2018: 13,911 CEA June 2018: 6352 Poor appetite: On Megace.  Return to clinic in 2 months  with blood work and follow-up

## 2017-07-23 ENCOUNTER — Ambulatory Visit (HOSPITAL_BASED_OUTPATIENT_CLINIC_OR_DEPARTMENT_OTHER): Payer: Medicare Other

## 2017-07-23 ENCOUNTER — Encounter: Payer: Self-pay | Admitting: Hematology and Oncology

## 2017-07-23 DIAGNOSIS — D649 Anemia, unspecified: Secondary | ICD-10-CM

## 2017-07-23 LAB — PREPARE RBC (CROSSMATCH)

## 2017-07-23 LAB — CEA (IN HOUSE-CHCC): CEA (CHCC-In House): 22254.73 ng/mL — ABNORMAL HIGH (ref 0.00–5.00)

## 2017-07-23 MED ORDER — SODIUM CHLORIDE 0.9 % IV SOLN
250.0000 mL | Freq: Once | INTRAVENOUS | Status: AC
  Administered 2017-07-23: 250 mL via INTRAVENOUS

## 2017-07-23 MED ORDER — DIPHENHYDRAMINE HCL 25 MG PO CAPS
ORAL_CAPSULE | ORAL | Status: AC
Start: 2017-07-23 — End: 2017-07-23
  Filled 2017-07-23: qty 1

## 2017-07-23 MED ORDER — ACETAMINOPHEN 325 MG PO TABS
650.0000 mg | ORAL_TABLET | Freq: Once | ORAL | Status: AC
  Administered 2017-07-23: 650 mg via ORAL

## 2017-07-23 MED ORDER — ACETAMINOPHEN 325 MG PO TABS
ORAL_TABLET | ORAL | Status: AC
Start: 2017-07-23 — End: 2017-07-23
  Filled 2017-07-23: qty 2

## 2017-07-23 MED ORDER — DIPHENHYDRAMINE HCL 25 MG PO CAPS
25.0000 mg | ORAL_CAPSULE | Freq: Once | ORAL | Status: AC
  Administered 2017-07-23: 25 mg via ORAL

## 2017-07-23 NOTE — Patient Instructions (Signed)

## 2017-07-23 NOTE — Progress Notes (Signed)
Patient Care Team: Wallene Huh, MD as PCP - General (Cardiology)  DIAGNOSIS:  Encounter Diagnosis  Name Primary?  . Metastatic colon cancer to liver ([place])     SUMMARY OF ONCOLOGIC HISTORY:   Metastatic colon cancer to liver ([place])   12/26/2016 Initial Diagnosis    Colon Biopsy mid to distal right ascending colon: Adenocarcinoma       CHIEF COMPLIANT: Severe anemia from metastatic colon cancer  INTERVAL HISTORY: Norman Rodgers is a 71 year old with above-mentioned history metastatic colon cancer who is here to check his blood work and to determine if he needs blood transfusion. He had decided not to undergo chemotherapy previously and was doing quite well. He did not want to be under hospice care previously. His wife brought him in today and he is in a wheelchair. He reports that his energy levels have trended significantly. He does have shortness of breath to minimal exertion and fatigue. He denies any blood in the stool.  REVIEW OF SYSTEMS:   Constitutional: Denies fevers, chills or abnormal weight loss Eyes: Denies blurriness of vision Ears, nose, mouth, throat, and face: Denies mucositis or sore throat Respiratory: Denies cough, dyspnea or wheezes Cardiovascular: Denies palpitation, chest discomfort Gastrointestinal:  Denies nausea, heartburn or change in bowel habits Skin: Denies abnormal skin rashes Lymphatics: Denies new lymphadenopathy or easy bruising Neurological:Denies numbness, tingling or new weaknesses Behavioral/Psych: Mood is stable, no new changes  Extremities: No lower extremity edema All other systems were reviewed with the patient and are negative.  I have reviewed the past medical history, past surgical history, social history and family history with the patient and they are unchanged from previous note.  ALLERGIES:  has No Known Allergies.  MEDICATIONS:  Current Outpatient Prescriptions  Medication Sig Dispense Refill  . amiodarone (PACERONE) 200  MG tablet Take 1 tablet (200 mg total) by mouth daily. 30 tablet 3  . amoxicillin-clavulanate (AUGMENTIN) 875-125 MG tablet Take 1 tablet by mouth 2 (two) times daily. For 7days 14 tablet 0  . bacitracin-polymyxin b (POLYSPORIN) ophthalmic ointment Place 1 application into the right eye every 12 (twelve) hours. apply to eye every 12 hours while awake 3.5 g 0  . carvedilol (COREG) 12.5 MG tablet Take 1 tablet (12.5 mg total) by mouth 2 (two) times daily with a meal. 60 tablet 3  . ferrous sulfate 325 (65 FE) MG EC tablet Take 1 tablet (325 mg total) by mouth daily with breakfast. 30 tablet 3  . glimepiride (AMARYL) 4 MG tablet Take 4 mg by mouth daily with breakfast.    . megestrol (MEGACE ES) 625 MG/5ML suspension Take 5 mLs (625 mg total) by mouth daily. 150 mL 0  . nitroGLYCERIN (NITROSTAT) 0.4 MG SL tablet Place 1 tablet (0.4 mg total) under the tongue every 5 (five) minutes x 3 doses as needed for chest pain. 25 tablet 3  . ramipril (ALTACE) 2.5 MG capsule Take 2 capsules (5 mg total) by mouth daily. 30 capsule 3  . rosuvastatin (CRESTOR) 40 MG tablet Take 0.5 tablets (20 mg total) by mouth daily at 6 PM. 30 tablet 3   No current facility-administered medications for this visit.     PHYSICAL EXAMINATION: ECOG PERFORMANCE STATUS: 3 - Symptomatic, >50% confined to bed  Vitals:   07/22/17 1603  BP: 129/66  Pulse: 99  Resp: 18  Temp: 98.8 F (37.1 C)  SpO2: 100%   Filed Weights   07/22/17 1625  Weight: 217 lb 4.8 oz (98.6 kg)  GENERAL:alert, no distress and comfortable SKIN: skin color, texture, turgor are normal, no rashes or significant lesions EYES: normal, Conjunctiva are pink and non-injected, sclera clear OROPHARYNX:no exudate, no erythema and lips, buccal mucosa, and tongue normal  NECK: supple, thyroid normal size, non-tender, without nodularity LYMPH:  no palpable lymphadenopathy in the cervical, axillary or inguinal LUNGS: clear to auscultation and percussion with  normal breathing effort HEART: regular rate & rhythm and no murmurs and no lower extremity edema ABDOMEN:abdomen soft, non-tender and normal bowel sounds MUSCULOSKELETAL:no cyanosis of digits and no clubbing  NEURO: alert & oriented x 3 with fluent speech, no focal motor/sensory deficits EXTREMITIES: No lower extremity edema  LABORATORY DATA:  I have reviewed the data as listed   Chemistry      Component Value Date/Time   NA 133 (L) 07/22/2017 1506   K 3.3 (L) 07/22/2017 1506   CL 103 03/21/2017 1907   CO2 21 (L) 07/22/2017 1506   BUN 7.7 07/22/2017 1506   CREATININE 0.8 07/22/2017 1506      Component Value Date/Time   CALCIUM 7.9 (L) 07/22/2017 1506   ALKPHOS 161 (H) 07/22/2017 1506   AST 33 07/22/2017 1506   ALT 10 07/22/2017 1506   BILITOT 2.73 (H) 07/22/2017 1506       Lab Results  Component Value Date   WBC 8.3 07/22/2017   HGB 5.3 (LL) 07/22/2017   HCT 18.9 (L) 07/22/2017   MCV 81.5 07/22/2017   PLT 418 (H) 07/22/2017   NEUTROABS 6.4 07/22/2017    ASSESSMENT & PLAN:  Metastatic colon cancer to liver [facility]) Metastatic Colon cancer with liver mets:  Current plan: 1. Hospice care: Patient holding off on pursuing 2. blood transfusions for symptomatic anemia  His hemoglobin today is 5.3. I recommended 2 units of blood transfusion to be done tomorrow. Prognosis: Patient does not wishto receive chemotherapy. His prognosis is poor.  CEA July 2018: 13,911 CEA June 2018: 6352 Poor appetite: On Megace.  Return to clinic in 2 months with blood work and follow-up  I spent 25 minutes talking to the patient of which more than half was spent in counseling and coordination of care.  Orders Placed This Encounter  Procedures  . Hold Tube, Blood Bank    Standing Status:   Future    Number of Occurrences:   1    Standing Expiration Date:   07/22/2018   The patient has a good understanding of the overall plan. he agrees with it. he will call with any problems that  may develop before the next visit here.   Rulon Eisenmenger, MD 07/23/17

## 2017-07-24 LAB — BPAM RBC
Blood Product Expiration Date: 201808312359
Blood Product Expiration Date: 201808312359
ISSUE DATE / TIME: 201808211402
ISSUE DATE / TIME: 201808211402
Unit Type and Rh: 6200
Unit Type and Rh: 6200

## 2017-07-24 LAB — TYPE AND SCREEN
ABO/RH(D): AB POS
Antibody Screen: NEGATIVE
Unit division: 0
Unit division: 0

## 2017-08-03 DIAGNOSIS — 419620001 Death: Secondary | SNOMED CT | POA: Diagnosis not present

## 2017-08-03 DEATH — deceased

## 2017-08-10 IMAGING — CT CT ORBITS W/ CM
3 series · 14 of 47 positions shown, 16 images · IV contrast (Omni 300)
Comparison: None.

CLINICAL DATA: Swelling and redness of the right eye. Stage IV
prostate cancer and colon cancer.

EXAM:
CT ORBITS WITH CONTRAST
TECHNIQUE: Multidetector CT images was performed according to the standard
protocol following intravenous contrast administration.
CONTRAST:  75mL 3LPJLK-WFF IOPAMIDOL (3LPJLK-WFF) INJECTION 61%

[Series 3: facialbone 2.0 st · axial · 0.38mm/px · z∈[-168,-68]mm · 8 of 59 slices shown, 10 images]
[im 5/59  brain]
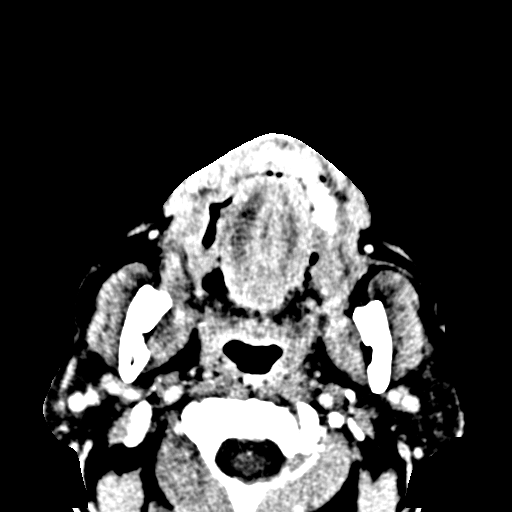
[im 5/59  bone]
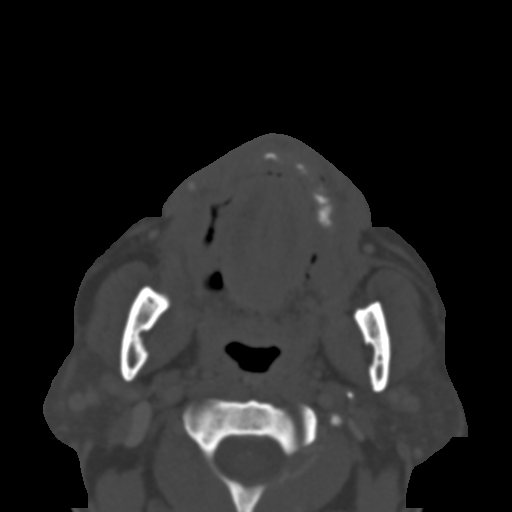
[im 13/59  bone]
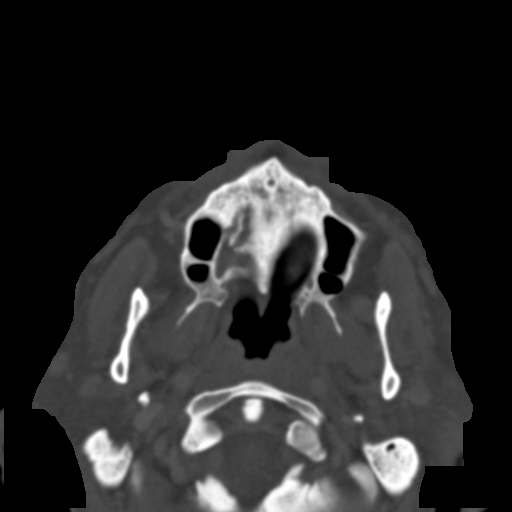
[im 19/59  bone]
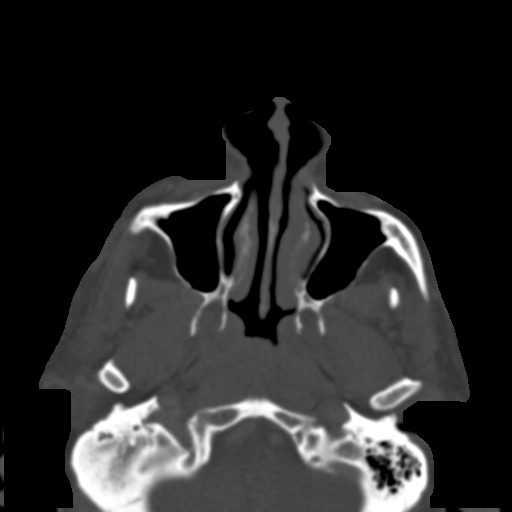
[im 27/59  bone]
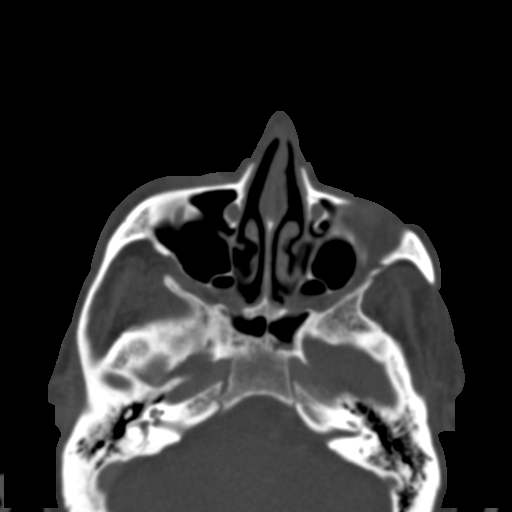
[im 33/59  brain]
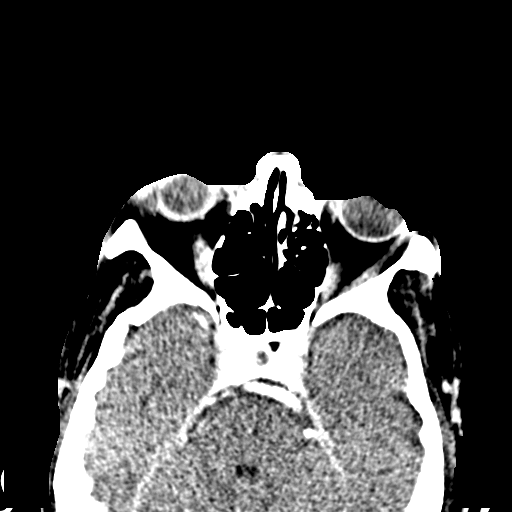
[im 33/59  bone]
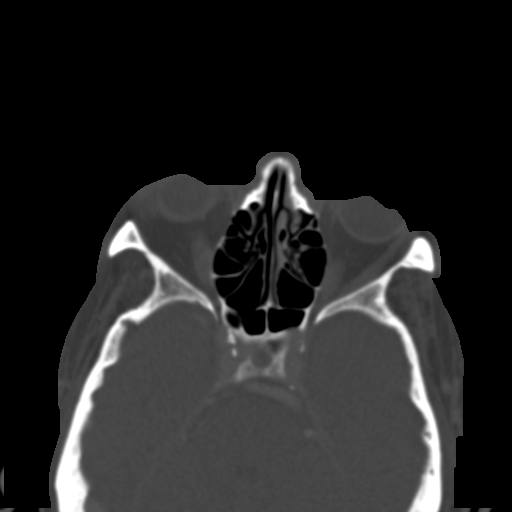
[im 41/59  bone]
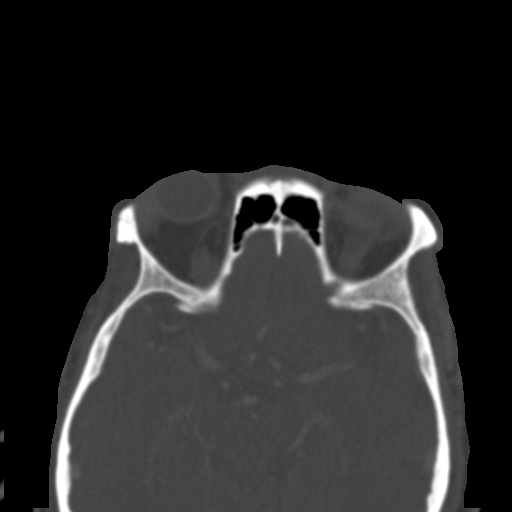
[im 47/59  bone]
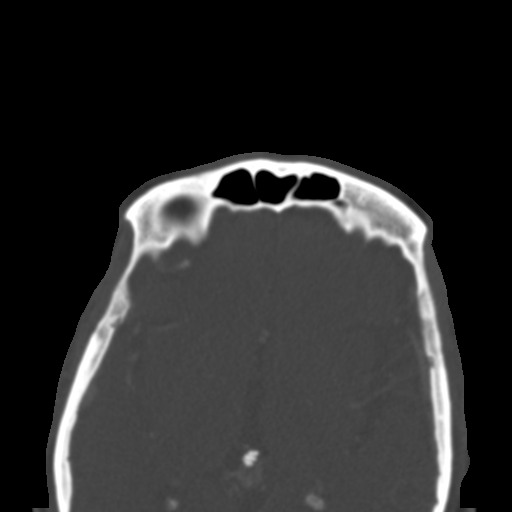
[im 55/59  bone]
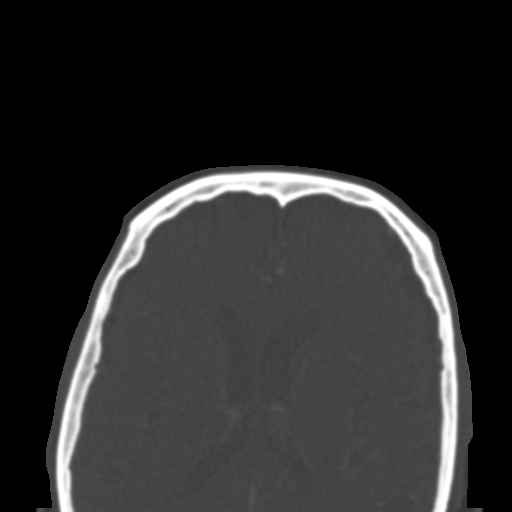

[Series 7: facialbone 2.0 cor st · coronal · 0.23mm/px · 3 of 87 slices shown]
[im 29/87  bone]
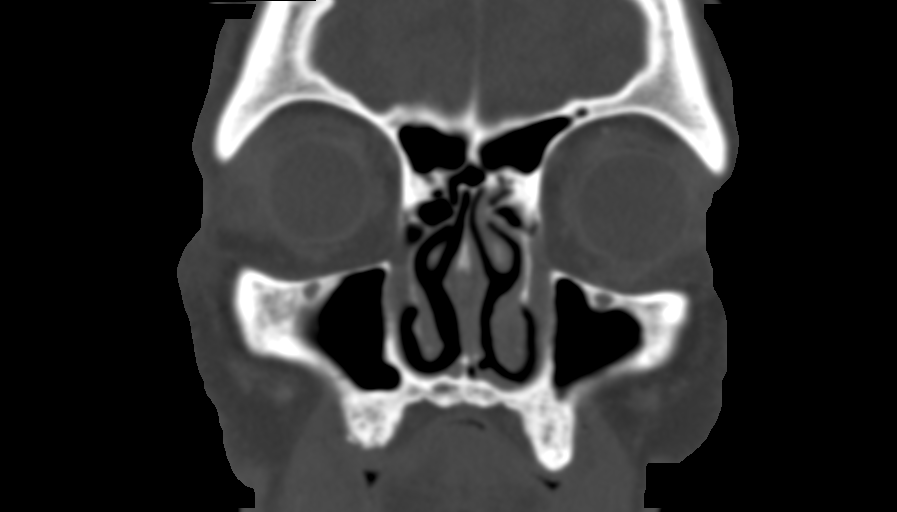
[im 39/87  bone]
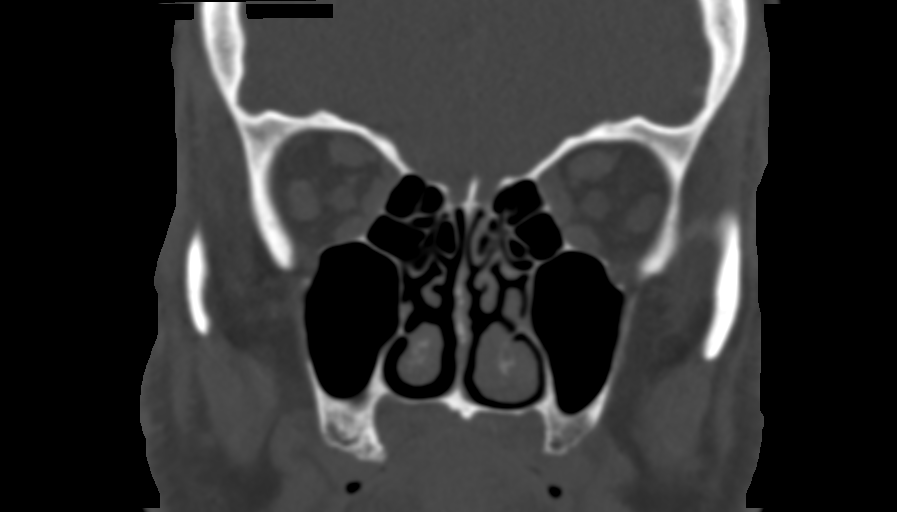
[im 48/87  bone]
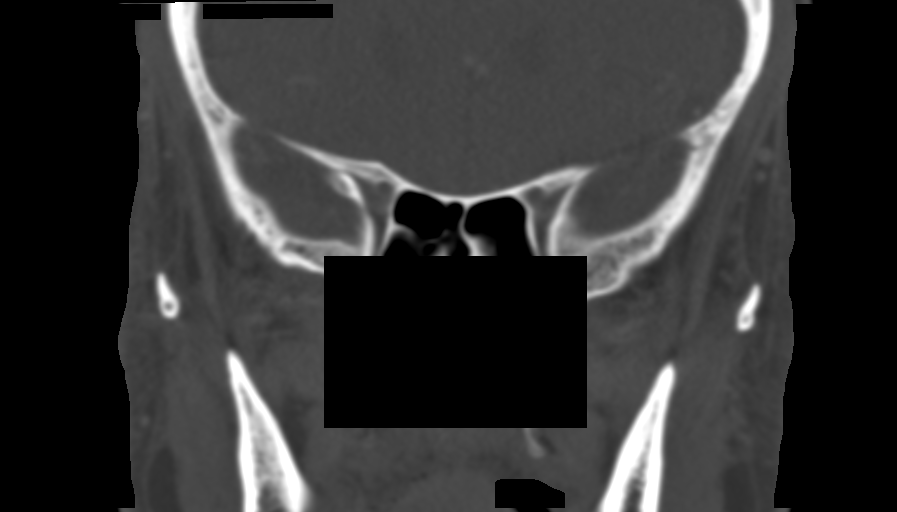

[Series 8: facialbone 2.0 sag st · sagittal · 0.24mm/px · 3 of 99 slices shown]
[im 33/99  bone]
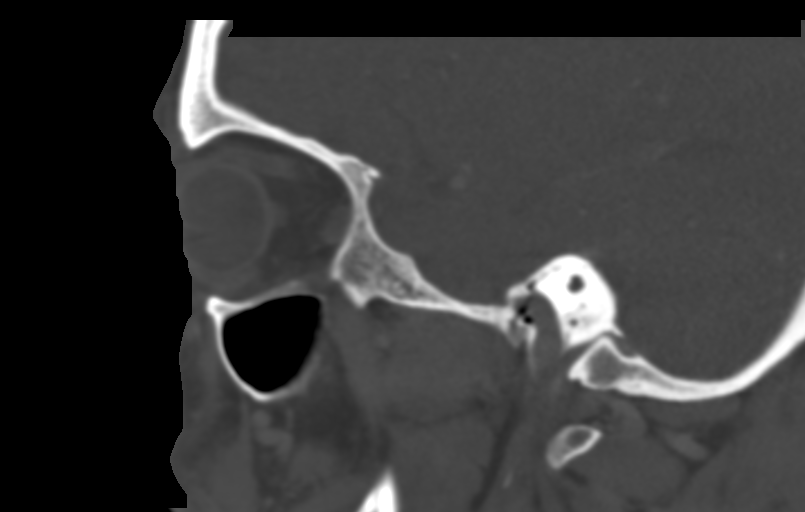
[im 50/99  bone]
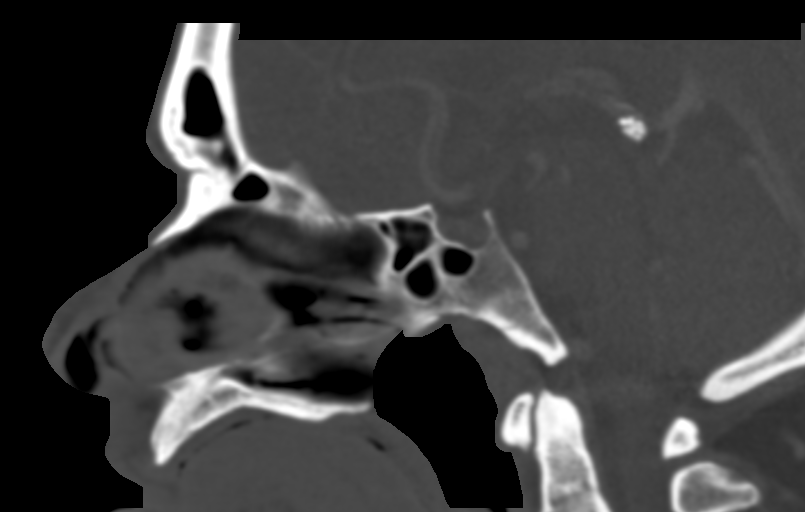
[im 66/99  bone]
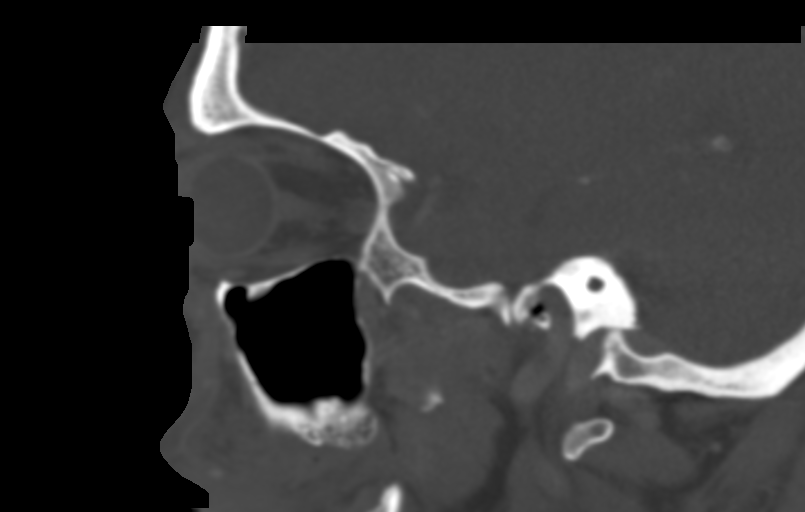

[14 of 47 positions shown; findings below may reference images not displayed]

FINDINGS: Orbits: Left orbit is normal. Right orbit shows swelling of the
lacrimal gland with nonenhancing areas measuring up to 8 mm
consistent with lacrimal gland inflammation and early abscess. This
should all be preseptal inflammation, though the gland is swollen
and and extends backwards because of the swelling. No evidence of
intraconal abscess. Extra-ocular muscles are intrinsically normal.
Optic nerve is normal. Orbital apex is normal.

Visualized sinuses: Normal

Soft tissues: Otherwise normal

Limited intracranial: Normal
IMPRESSION: Lacrimal gland inflammation on the right with early abscess
formation measuring up to 8 mm. No evidence of postseptal or
intraconal extension at this time.
